# Patient Record
Sex: Female | Born: 1945 | Race: White | Hispanic: No | Marital: Married | Smoking: Never smoker
Health system: Southern US, Community
[De-identification: ages and names within clinical notes are randomized; demographics above are authoritative.]

## PROBLEM LIST (undated history)

## (undated) DIAGNOSIS — I10 Essential (primary) hypertension: Secondary | ICD-10-CM

## (undated) HISTORY — PX: BREAST SURGERY: SHX581

---

## 2001-01-13 ENCOUNTER — Ambulatory Visit: Admission: RE | Admit: 2001-01-13 | Discharge: 2001-01-13 | Payer: Self-pay | Admitting: Gynecology

## 2001-01-15 ENCOUNTER — Encounter: Admission: RE | Admit: 2001-01-15 | Discharge: 2001-04-15 | Payer: Self-pay | Admitting: *Deleted

## 2001-02-05 ENCOUNTER — Ambulatory Visit (HOSPITAL_COMMUNITY): Admission: AD | Admit: 2001-02-05 | Discharge: 2001-02-05 | Payer: Self-pay | Admitting: *Deleted

## 2001-03-01 ENCOUNTER — Encounter: Admission: RE | Admit: 2001-03-01 | Discharge: 2001-03-01 | Payer: Self-pay | Admitting: Oncology

## 2001-03-01 ENCOUNTER — Encounter (HOSPITAL_COMMUNITY): Admission: RE | Admit: 2001-03-01 | Discharge: 2001-03-31 | Payer: Self-pay | Admitting: Oncology

## 2001-03-24 ENCOUNTER — Ambulatory Visit: Admission: RE | Admit: 2001-03-24 | Discharge: 2001-03-24 | Payer: Self-pay | Admitting: Gynecology

## 2001-03-30 ENCOUNTER — Inpatient Hospital Stay (HOSPITAL_COMMUNITY): Admission: RE | Admit: 2001-03-30 | Discharge: 2001-04-02 | Payer: Self-pay | Admitting: *Deleted

## 2001-04-14 ENCOUNTER — Encounter: Admission: RE | Admit: 2001-04-14 | Discharge: 2001-04-14 | Payer: Self-pay | Admitting: Oncology

## 2001-05-17 ENCOUNTER — Encounter (HOSPITAL_COMMUNITY): Admission: RE | Admit: 2001-05-17 | Discharge: 2001-06-16 | Payer: Self-pay | Admitting: Oncology

## 2001-05-17 ENCOUNTER — Encounter: Admission: RE | Admit: 2001-05-17 | Discharge: 2001-05-17 | Payer: Self-pay | Admitting: Oncology

## 2001-07-27 ENCOUNTER — Ambulatory Visit: Admission: RE | Admit: 2001-07-27 | Discharge: 2001-07-27 | Payer: Self-pay | Admitting: Gynecology

## 2001-07-27 ENCOUNTER — Other Ambulatory Visit: Admission: RE | Admit: 2001-07-27 | Discharge: 2001-07-27 | Payer: Self-pay | Admitting: Gynecology

## 2001-07-28 ENCOUNTER — Encounter: Admission: RE | Admit: 2001-07-28 | Discharge: 2001-07-28 | Payer: Self-pay | Admitting: Oncology

## 2001-09-08 ENCOUNTER — Encounter: Admission: RE | Admit: 2001-09-08 | Discharge: 2001-09-08 | Payer: Self-pay | Admitting: Oncology

## 2001-10-19 ENCOUNTER — Encounter (HOSPITAL_COMMUNITY): Payer: Self-pay | Admitting: Oncology

## 2001-10-19 ENCOUNTER — Encounter: Admission: RE | Admit: 2001-10-19 | Discharge: 2001-10-19 | Payer: Self-pay | Admitting: Oncology

## 2001-10-19 ENCOUNTER — Encounter (HOSPITAL_COMMUNITY): Admission: RE | Admit: 2001-10-19 | Discharge: 2001-11-18 | Payer: Self-pay | Admitting: Oncology

## 2001-11-30 ENCOUNTER — Encounter (HOSPITAL_COMMUNITY): Admission: RE | Admit: 2001-11-30 | Discharge: 2001-12-30 | Payer: Self-pay | Admitting: Oncology

## 2001-11-30 ENCOUNTER — Encounter: Admission: RE | Admit: 2001-11-30 | Discharge: 2001-11-30 | Payer: Self-pay | Admitting: Oncology

## 2001-11-30 ENCOUNTER — Encounter (HOSPITAL_COMMUNITY): Payer: Self-pay | Admitting: Oncology

## 2002-02-09 ENCOUNTER — Ambulatory Visit: Admission: RE | Admit: 2002-02-09 | Discharge: 2002-02-09 | Payer: Self-pay | Admitting: Gynecologic Oncology

## 2002-02-09 ENCOUNTER — Other Ambulatory Visit: Admission: RE | Admit: 2002-02-09 | Discharge: 2002-02-09 | Payer: Self-pay | Admitting: Gynecologic Oncology

## 2002-02-24 ENCOUNTER — Encounter: Admission: RE | Admit: 2002-02-24 | Discharge: 2002-02-24 | Payer: Self-pay | Admitting: Oncology

## 2002-04-05 ENCOUNTER — Encounter: Admission: RE | Admit: 2002-04-05 | Discharge: 2002-04-05 | Payer: Self-pay | Admitting: Oncology

## 2002-05-19 ENCOUNTER — Encounter: Admission: RE | Admit: 2002-05-19 | Discharge: 2002-05-19 | Payer: Self-pay | Admitting: Oncology

## 2002-05-19 ENCOUNTER — Encounter (HOSPITAL_COMMUNITY): Admission: RE | Admit: 2002-05-19 | Discharge: 2002-06-18 | Payer: Self-pay | Admitting: Oncology

## 2002-06-29 ENCOUNTER — Encounter: Admission: RE | Admit: 2002-06-29 | Discharge: 2002-06-29 | Payer: Self-pay | Admitting: Oncology

## 2002-06-29 ENCOUNTER — Encounter (HOSPITAL_COMMUNITY): Admission: RE | Admit: 2002-06-29 | Discharge: 2002-07-29 | Payer: Self-pay | Admitting: Oncology

## 2002-08-10 ENCOUNTER — Encounter: Admission: RE | Admit: 2002-08-10 | Discharge: 2002-08-10 | Payer: Self-pay | Admitting: Oncology

## 2002-09-21 ENCOUNTER — Encounter: Admission: RE | Admit: 2002-09-21 | Discharge: 2002-09-21 | Payer: Self-pay | Admitting: Oncology

## 2002-10-26 ENCOUNTER — Other Ambulatory Visit: Admission: RE | Admit: 2002-10-26 | Discharge: 2002-10-26 | Payer: Self-pay | Admitting: Gynecology

## 2002-10-26 ENCOUNTER — Ambulatory Visit: Admission: RE | Admit: 2002-10-26 | Discharge: 2002-10-26 | Payer: Self-pay | Admitting: Gynecology

## 2002-11-02 ENCOUNTER — Encounter: Admission: RE | Admit: 2002-11-02 | Discharge: 2002-11-02 | Payer: Self-pay | Admitting: Oncology

## 2002-11-08 ENCOUNTER — Ambulatory Visit (HOSPITAL_COMMUNITY): Admission: RE | Admit: 2002-11-08 | Discharge: 2002-11-08 | Payer: Self-pay | Admitting: General Surgery

## 2003-08-30 ENCOUNTER — Encounter (HOSPITAL_COMMUNITY): Admission: RE | Admit: 2003-08-30 | Discharge: 2003-09-29 | Payer: Self-pay | Admitting: Oncology

## 2003-08-30 ENCOUNTER — Encounter: Admission: RE | Admit: 2003-08-30 | Discharge: 2003-08-30 | Payer: Self-pay | Admitting: Oncology

## 2006-04-07 ENCOUNTER — Ambulatory Visit (HOSPITAL_COMMUNITY): Admission: RE | Admit: 2006-04-07 | Discharge: 2006-04-07 | Payer: Self-pay | Admitting: Internal Medicine

## 2006-11-13 ENCOUNTER — Ambulatory Visit (HOSPITAL_COMMUNITY): Admission: RE | Admit: 2006-11-13 | Discharge: 2006-11-13 | Payer: Self-pay | Admitting: Internal Medicine

## 2010-12-15 ENCOUNTER — Encounter: Payer: Self-pay | Admitting: Internal Medicine

## 2011-01-27 ENCOUNTER — Ambulatory Visit (HOSPITAL_COMMUNITY)
Admission: RE | Admit: 2011-01-27 | Discharge: 2011-01-27 | Disposition: A | Discharge status: Home / Self Care | Payer: Self-pay | Source: Ambulatory Visit | Attending: Internal Medicine | Admitting: Internal Medicine

## 2011-01-27 ENCOUNTER — Emergency Department (HOSPITAL_COMMUNITY)
Admission: EM | Admit: 2011-01-27 | Discharge: 2011-01-27 | Disposition: A | Discharge status: Home / Self Care | Payer: Self-pay | Attending: Emergency Medicine | Admitting: Emergency Medicine

## 2011-01-27 ENCOUNTER — Other Ambulatory Visit (HOSPITAL_COMMUNITY): Payer: Self-pay | Admitting: Internal Medicine

## 2011-01-27 DIAGNOSIS — M7989 Other specified soft tissue disorders: Secondary | ICD-10-CM

## 2011-01-27 DIAGNOSIS — R52 Pain, unspecified: Secondary | ICD-10-CM

## 2011-01-27 DIAGNOSIS — I82890 Acute embolism and thrombosis of other specified veins: Secondary | ICD-10-CM | POA: Insufficient documentation

## 2011-01-27 DIAGNOSIS — I82409 Acute embolism and thrombosis of unspecified deep veins of unspecified lower extremity: Secondary | ICD-10-CM | POA: Insufficient documentation

## 2011-01-27 DIAGNOSIS — M79609 Pain in unspecified limb: Secondary | ICD-10-CM | POA: Insufficient documentation

## 2011-01-27 LAB — CBC
HCT: 43.1 % (ref 36.0–46.0)
Hemoglobin: 14.5 g/dL (ref 12.0–15.0)
MCH: 29.9 pg (ref 26.0–34.0)
MCHC: 33.6 g/dL (ref 30.0–36.0)
MCV: 88.9 fL (ref 78.0–100.0)
Platelets: 141 10*3/uL — ABNORMAL LOW (ref 150–400)
RBC: 4.85 MIL/uL (ref 3.87–5.11)
RDW: 13.8 % (ref 11.5–15.5)
WBC: 4.4 10*3/uL (ref 4.0–10.5)

## 2011-01-27 LAB — POCT I-STAT, CHEM 8
BUN: 18 mg/dL (ref 6–23)
Calcium, Ion: 1.16 mmol/L (ref 1.12–1.32)
Chloride: 102 mEq/L (ref 96–112)
Creatinine, Ser: 0.8 mg/dL (ref 0.4–1.2)
Glucose, Bld: 168 mg/dL — ABNORMAL HIGH (ref 70–99)
HCT: 43 % (ref 36.0–46.0)
Hemoglobin: 14.6 g/dL (ref 12.0–15.0)
Potassium: 3.8 mEq/L (ref 3.5–5.1)
Sodium: 138 mEq/L (ref 135–145)
TCO2: 26 mmol/L (ref 0–100)

## 2011-01-27 LAB — DIFFERENTIAL
Basophils Absolute: 0 10*3/uL (ref 0.0–0.1)
Basophils Relative: 0 % (ref 0–1)
Eosinophils Absolute: 0 10*3/uL (ref 0.0–0.7)
Eosinophils Relative: 1 % (ref 0–5)
Lymphocytes Relative: 30 % (ref 12–46)
Lymphs Abs: 1.3 10*3/uL (ref 0.7–4.0)
Monocytes Absolute: 0.4 10*3/uL (ref 0.1–1.0)
Monocytes Relative: 8 % (ref 3–12)
Neutro Abs: 2.7 10*3/uL (ref 1.7–7.7)
Neutrophils Relative %: 61 % (ref 43–77)

## 2011-01-27 LAB — APTT: aPTT: 29 seconds (ref 24–37)

## 2011-01-27 LAB — PROTIME-INR
INR: 0.93 (ref 0.00–1.49)
Prothrombin Time: 12.7 seconds (ref 11.6–15.2)

## 2011-04-25 ENCOUNTER — Other Ambulatory Visit (HOSPITAL_COMMUNITY): Payer: Self-pay | Admitting: Internal Medicine

## 2011-04-25 DIAGNOSIS — Z139 Encounter for screening, unspecified: Secondary | ICD-10-CM

## 2011-05-01 ENCOUNTER — Ambulatory Visit (HOSPITAL_COMMUNITY): Payer: Self-pay

## 2011-05-06 ENCOUNTER — Ambulatory Visit (HOSPITAL_COMMUNITY)
Admission: RE | Admit: 2011-05-06 | Discharge: 2011-05-06 | Disposition: A | Discharge status: Home / Self Care | Payer: Medicare Other | Source: Ambulatory Visit | Attending: Internal Medicine | Admitting: Internal Medicine

## 2011-05-06 DIAGNOSIS — Z1231 Encounter for screening mammogram for malignant neoplasm of breast: Secondary | ICD-10-CM | POA: Insufficient documentation

## 2011-05-06 DIAGNOSIS — Z139 Encounter for screening, unspecified: Secondary | ICD-10-CM

## 2011-07-18 ENCOUNTER — Other Ambulatory Visit (HOSPITAL_COMMUNITY): Payer: Self-pay | Admitting: Internal Medicine

## 2011-07-18 ENCOUNTER — Encounter (HOSPITAL_COMMUNITY): Payer: Self-pay

## 2011-07-18 ENCOUNTER — Ambulatory Visit (HOSPITAL_COMMUNITY)
Admission: RE | Admit: 2011-07-18 | Discharge: 2011-07-18 | Disposition: A | Discharge status: Home / Self Care | Payer: Medicare Other | Source: Ambulatory Visit | Attending: Internal Medicine | Admitting: Internal Medicine

## 2011-07-18 DIAGNOSIS — W19XXXA Unspecified fall, initial encounter: Secondary | ICD-10-CM | POA: Insufficient documentation

## 2011-07-18 DIAGNOSIS — S52539B Colles' fracture of unspecified radius, initial encounter for open fracture type I or II: Secondary | ICD-10-CM | POA: Insufficient documentation

## 2011-07-18 HISTORY — DX: Essential (primary) hypertension: I10

## 2013-05-11 ENCOUNTER — Ambulatory Visit: Payer: Medicare Other | Admitting: Cardiovascular Disease

## 2013-08-22 ENCOUNTER — Emergency Department (HOSPITAL_COMMUNITY): Payer: Medicare Other

## 2013-08-22 ENCOUNTER — Inpatient Hospital Stay (HOSPITAL_COMMUNITY)
Admission: EM | Admit: 2013-08-22 | Discharge: 2013-08-29 | DRG: 087 | Disposition: A | Discharge status: Home / Self Care | Payer: Medicare Other | Attending: Neurological Surgery | Admitting: Neurological Surgery

## 2013-08-22 ENCOUNTER — Inpatient Hospital Stay (HOSPITAL_COMMUNITY): Payer: Medicare Other

## 2013-08-22 ENCOUNTER — Encounter (HOSPITAL_COMMUNITY): Payer: Self-pay | Admitting: *Deleted

## 2013-08-22 DIAGNOSIS — S62113A Displaced fracture of triquetrum [cuneiform] bone, unspecified wrist, initial encounter for closed fracture: Secondary | ICD-10-CM | POA: Diagnosis present

## 2013-08-22 DIAGNOSIS — I10 Essential (primary) hypertension: Secondary | ICD-10-CM | POA: Diagnosis present

## 2013-08-22 DIAGNOSIS — W19XXXA Unspecified fall, initial encounter: Secondary | ICD-10-CM | POA: Diagnosis present

## 2013-08-22 DIAGNOSIS — Z79899 Other long term (current) drug therapy: Secondary | ICD-10-CM

## 2013-08-22 DIAGNOSIS — E119 Type 2 diabetes mellitus without complications: Secondary | ICD-10-CM | POA: Diagnosis present

## 2013-08-22 DIAGNOSIS — S62112A Displaced fracture of triquetrum [cuneiform] bone, left wrist, initial encounter for closed fracture: Secondary | ICD-10-CM

## 2013-08-22 DIAGNOSIS — R112 Nausea with vomiting, unspecified: Secondary | ICD-10-CM | POA: Diagnosis present

## 2013-08-22 DIAGNOSIS — S065X9A Traumatic subdural hemorrhage with loss of consciousness of unspecified duration, initial encounter: Principal | ICD-10-CM | POA: Diagnosis present

## 2013-08-22 DIAGNOSIS — S065XAA Traumatic subdural hemorrhage with loss of consciousness status unknown, initial encounter: Principal | ICD-10-CM | POA: Diagnosis present

## 2013-08-22 DIAGNOSIS — R51 Headache: Secondary | ICD-10-CM | POA: Diagnosis present

## 2013-08-22 LAB — CBC
HCT: 45.1 % (ref 36.0–46.0)
Hemoglobin: 15.4 g/dL — ABNORMAL HIGH (ref 12.0–15.0)
MCH: 30.7 pg (ref 26.0–34.0)
MCHC: 34.1 g/dL (ref 30.0–36.0)
MCV: 90 fL (ref 78.0–100.0)
Platelets: 142 10*3/uL — ABNORMAL LOW (ref 150–400)
RBC: 5.01 MIL/uL (ref 3.87–5.11)
RDW: 13.4 % (ref 11.5–15.5)
WBC: 6.1 10*3/uL (ref 4.0–10.5)

## 2013-08-22 LAB — COMPREHENSIVE METABOLIC PANEL
ALT: 14 U/L (ref 0–35)
AST: 14 U/L (ref 0–37)
Albumin: 4.2 g/dL (ref 3.5–5.2)
Alkaline Phosphatase: 92 U/L (ref 39–117)
BUN: 22 mg/dL (ref 6–23)
CO2: 29 mEq/L (ref 19–32)
Calcium: 10.4 mg/dL (ref 8.4–10.5)
Chloride: 102 mEq/L (ref 96–112)
Creatinine, Ser: 0.7 mg/dL (ref 0.50–1.10)
GFR calc Af Amer: >90 mL/min (ref >90)
GFR calc non Af Amer: 88 mL/min — ABNORMAL LOW (ref >90)
Glucose, Bld: 222 mg/dL — ABNORMAL HIGH (ref 70–99)
Potassium: 4.1 mEq/L (ref 3.5–5.1)
Sodium: 140 mEq/L (ref 135–145)
Total Bilirubin: 1 mg/dL (ref 0.3–1.2)
Total Protein: 7.5 g/dL (ref 6.0–8.3)

## 2013-08-22 LAB — GLUCOSE, CAPILLARY
Glucose-Capillary: 206 mg/dL — ABNORMAL HIGH (ref 70–99)
Glucose-Capillary: 202 mg/dL — ABNORMAL HIGH (ref 70–99)
Glucose-Capillary: 170 mg/dL — ABNORMAL HIGH (ref 70–99)

## 2013-08-22 LAB — DIFFERENTIAL
Basophils Absolute: 0 10*3/uL (ref 0.0–0.1)
Basophils Relative: 0 % (ref 0–1)
Eosinophils Absolute: 0.1 10*3/uL (ref 0.0–0.7)
Eosinophils Relative: 1 % (ref 0–5)
Lymphocytes Relative: 17 % (ref 12–46)
Lymphs Abs: 1.1 10*3/uL (ref 0.7–4.0)
Monocytes Absolute: 0.4 10*3/uL (ref 0.1–1.0)
Monocytes Relative: 6 % (ref 3–12)
Neutro Abs: 4.6 10*3/uL (ref 1.7–7.7)
Neutrophils Relative %: 76 % (ref 43–77)

## 2013-08-22 LAB — POCT I-STAT, CHEM 8
BUN: 22 mg/dL (ref 6–23)
Calcium, Ion: 1.24 mmol/L (ref 1.13–1.30)
Chloride: 103 mEq/L (ref 96–112)
Creatinine, Ser: 0.7 mg/dL (ref 0.50–1.10)
Glucose, Bld: 214 mg/dL — ABNORMAL HIGH (ref 70–99)
HCT: 46 % (ref 36.0–46.0)
Hemoglobin: 15.6 g/dL — ABNORMAL HIGH (ref 12.0–15.0)
Potassium: 4 mEq/L (ref 3.5–5.1)
Sodium: 142 mEq/L (ref 135–145)
TCO2: 27 mmol/L (ref 0–100)

## 2013-08-22 LAB — PROTIME-INR
INR: 1.02 (ref 0.00–1.49)
Prothrombin Time: 13.2 seconds (ref 11.6–15.2)

## 2013-08-22 LAB — APTT: aPTT: 30 seconds (ref 24–37)

## 2013-08-22 LAB — TROPONIN I: Troponin I: <0.3 ng/mL (ref <0.30)

## 2013-08-22 LAB — ETHANOL: Alcohol, Ethyl (B): <11 mg/dL (ref 0–11)

## 2013-08-22 LAB — MRSA PCR SCREENING: MRSA by PCR: NEGATIVE

## 2013-08-22 MED ORDER — METFORMIN HCL 500 MG PO TABS
1000.0000 mg | ORAL_TABLET | Freq: Two times a day (BID) | ORAL | Status: DC
  Administered 2013-08-23 – 2013-08-27 (×6): 1000 mg via ORAL
  Filled 2013-08-22 (×16): qty 2

## 2013-08-22 MED ORDER — MORPHINE SULFATE 4 MG/ML IJ SOLN
4.0000 mg | Freq: Once | INTRAMUSCULAR | Status: AC
  Administered 2013-08-22: 4 mg via INTRAVENOUS
  Filled 2013-08-22: qty 1

## 2013-08-22 MED ORDER — MORPHINE SULFATE 4 MG/ML IJ SOLN
4.0000 mg | Freq: Once | INTRAMUSCULAR | Status: AC
  Administered 2013-08-22: 4 mg via INTRAMUSCULAR
  Filled 2013-08-22: qty 1

## 2013-08-22 MED ORDER — LOSARTAN POTASSIUM 50 MG PO TABS
100.0000 mg | ORAL_TABLET | Freq: Every day | ORAL | Status: DC
Start: 2013-08-22 — End: 2013-08-29
  Administered 2013-08-22 – 2013-08-29 (×8): 100 mg via ORAL
  Filled 2013-08-22 (×9): qty 2

## 2013-08-22 MED ORDER — HYDROCODONE-ACETAMINOPHEN 5-325 MG PO TABS
1.0000 | ORAL_TABLET | ORAL | Status: DC | PRN
  Administered 2013-08-22: 1 via ORAL
  Administered 2013-08-23: 2 via ORAL
  Administered 2013-08-23 (×2): 1 via ORAL
  Administered 2013-08-23 – 2013-08-24 (×3): 2 via ORAL
  Administered 2013-08-24: 1 via ORAL
  Administered 2013-08-25: 2 via ORAL
  Administered 2013-08-25: 1 via ORAL
  Administered 2013-08-25 – 2013-08-26 (×2): 2 via ORAL
  Administered 2013-08-26: 1 via ORAL
  Administered 2013-08-27 (×2): 2 via ORAL
  Administered 2013-08-27: 1 via ORAL
  Administered 2013-08-28 – 2013-08-29 (×5): 2 via ORAL
  Filled 2013-08-22 (×3): qty 2
  Filled 2013-08-22: qty 1
  Filled 2013-08-22 (×4): qty 2
  Filled 2013-08-22: qty 1
  Filled 2013-08-22: qty 2
  Filled 2013-08-22 (×2): qty 1
  Filled 2013-08-22 (×8): qty 2
  Filled 2013-08-22 (×2): qty 1

## 2013-08-22 MED ORDER — ONDANSETRON 8 MG PO TBDP
8.0000 mg | ORAL_TABLET | Freq: Once | ORAL | Status: AC
  Administered 2013-08-22: 8 mg via ORAL
  Filled 2013-08-22: qty 1

## 2013-08-22 MED ORDER — LOSARTAN POTASSIUM 50 MG PO TABS: 100.0000 mg | ORAL_TABLET | Freq: Every day | ORAL | Status: DC

## 2013-08-22 NOTE — ED Notes (Signed)
MD at bedside. 

## 2013-08-22 NOTE — H&P (Signed)
Susan Odom is an 67 y.o. female.   Chief Complaint: Headaches for past week HPI: Patient is a 67 year old right handed female who has had a fall a week ago Tuesday that is 6 days ago. She notes that she had significant headaches and had pain she has been persisting with these and has been nauseated for the past several days. She's had some episodes of vomiting. She was brought to the [facility] emergency room where was noted that she had a subdural hematoma on the right side measuring about 6 or 7 mm in thickness is also noted be 6-7 mm of midline shift right to left. The subdural hematoma covers a large expansile to the surface of her skull.  The patient denies that she's had any weakness dizziness she has been nauseated along with the headaches she has noted some very minimal neck stiffness she has some pain in her left upper extremity which was injured in the fall that she did sustain. She is wearing a splint on his left upper extremity.  Past Medical History  Diagnosis Date  . Hypertension   . Diabetes mellitus     History reviewed. No pertinent past surgical history.  History reviewed. No pertinent family history. Social History:  reports that she has never smoked. She does not have any smokeless tobacco history on file. She reports that she does not drink alcohol or use illicit drugs.  Allergies: No Known Allergies  Medications Prior to Admission  Medication Sig Dispense Refill  . acetaminophen (TYLENOL) 500 MG tablet Take 1,000 mg by mouth every 6 (six) hours as needed for pain.      Marland Kitchen ALPRAZolam (XANAX) 1 MG tablet Take 1 mg by mouth 4 (four) times daily.      Marland Kitchen losartan (COZAAR) 100 MG tablet Take 100 mg by mouth daily.      . metFORMIN (GLUCOPHAGE) 500 MG tablet Take 1,000 mg by mouth 2 (two) times daily.      Marland Kitchen oxyCODONE-acetaminophen (PERCOCET) 5-325 MG per tablet Take 1 tablet by mouth every 4 (four) hours as needed for pain.        Results for orders placed during the  hospital encounter of 08/22/13 (from the past 48 hour(s))  GLUCOSE, CAPILLARY     Status: Abnormal   Collection Time    08/22/13 12:32 PM      Result Value Range   Glucose-Capillary 206 (*) 70 - 99 mg/dL   Comment 1 Documented in Chart     Comment 2 Notify RN    GLUCOSE, CAPILLARY     Status: Abnormal   Collection Time    08/22/13  4:17 PM      Result Value Range   Glucose-Capillary 202 (*) 70 - 99 mg/dL  ETHANOL     Status: None   Collection Time    08/22/13  4:23 PM      Result Value Range   Alcohol, Ethyl (B) <11  0 - 11 mg/dL   Comment:            LOWEST DETECTABLE LIMIT FOR     SERUM ALCOHOL IS 11 mg/dL     FOR MEDICAL PURPOSES ONLY  PROTIME-INR     Status: None   Collection Time    08/22/13  4:23 PM      Result Value Range   Prothrombin Time 13.2  11.6 - 15.2 seconds   INR 1.02  0.00 - 1.49  APTT     Status: None  Collection Time    08/22/13  4:23 PM      Result Value Range   aPTT 30  24 - 37 seconds  CBC     Status: Abnormal   Collection Time    08/22/13  4:23 PM      Result Value Range   WBC 6.1  4.0 - 10.5 K/uL   RBC 5.01  3.87 - 5.11 MIL/uL   Hemoglobin 15.4 (*) 12.0 - 15.0 g/dL   HCT 40.9  81.1 - 91.4 %   MCV 90.0  78.0 - 100.0 fL   MCH 30.7  26.0 - 34.0 pg   MCHC 34.1  30.0 - 36.0 g/dL   RDW 78.2  95.6 - 21.3 %   Platelets 142 (*) 150 - 400 K/uL  DIFFERENTIAL     Status: None   Collection Time    08/22/13  4:23 PM      Result Value Range   Neutrophils Relative % 76  43 - 77 %   Neutro Abs 4.6  1.7 - 7.7 K/uL   Lymphocytes Relative 17  12 - 46 %   Lymphs Abs 1.1  0.7 - 4.0 K/uL   Monocytes Relative 6  3 - 12 %   Monocytes Absolute 0.4  0.1 - 1.0 K/uL   Eosinophils Relative 1  0 - 5 %   Eosinophils Absolute 0.1  0.0 - 0.7 K/uL   Basophils Relative 0  0 - 1 %   Basophils Absolute 0.0  0.0 - 0.1 K/uL  COMPREHENSIVE METABOLIC PANEL     Status: Abnormal   Collection Time    08/22/13  4:23 PM      Result Value Range   Sodium 140  135 - 145 mEq/L    Potassium 4.1  3.5 - 5.1 mEq/L   Chloride 102  96 - 112 mEq/L   CO2 29  19 - 32 mEq/L   Glucose, Bld 222 (*) 70 - 99 mg/dL   BUN 22  6 - 23 mg/dL   Creatinine, Ser 0.86  0.50 - 1.10 mg/dL   Calcium 57.8  8.4 - 46.9 mg/dL   Total Protein 7.5  6.0 - 8.3 g/dL   Albumin 4.2  3.5 - 5.2 g/dL   AST 14  0 - 37 U/L   ALT 14  0 - 35 U/L   Alkaline Phosphatase 92  39 - 117 U/L   Total Bilirubin 1.0  0.3 - 1.2 mg/dL   GFR calc non Af Amer 88 (*) >90 mL/min   GFR calc Af Amer >90  >90 mL/min   Comment: (NOTE)     The eGFR has been calculated using the CKD EPI equation.     This calculation has not been validated in all clinical situations.     eGFR's persistently <90 mL/min signify possible Chronic Kidney     Disease.  TROPONIN I     Status: None   Collection Time    08/22/13  4:23 PM      Result Value Range   Troponin I <0.30  <0.30 ng/mL   Comment:            Due to the release kinetics of cTnI,     a negative result within the first hours     of the onset of symptoms does not rule out     myocardial infarction with certainty.     If myocardial infarction is still suspected,     repeat the test at  appropriate intervals.  POCT I-STAT, CHEM 8     Status: Abnormal   Collection Time    08/22/13  4:39 PM      Result Value Range   Sodium 142  135 - 145 mEq/L   Potassium 4.0  3.5 - 5.1 mEq/L   Chloride 103  96 - 112 mEq/L   BUN 22  6 - 23 mg/dL   Creatinine, Ser 1.30  0.50 - 1.10 mg/dL   Glucose, Bld 865 (*) 70 - 99 mg/dL   Calcium, Ion 7.84  6.96 - 1.30 mmol/L   TCO2 27  0 - 100 mmol/L   Hemoglobin 15.6 (*) 12.0 - 15.0 g/dL   HCT 29.5  28.4 - 13.2 %   Ct Head Wo Contrast  08/22/2013   CLINICAL DATA:  Tripped and fell 6 days ago striking front of head, no loss of consciousness, headache since, history hypertension, diabetes, initial encounter  EXAM: CT HEAD WITHOUT CONTRAST  TECHNIQUE: Contiguous axial images were obtained from the base of the skull through the vertex without  intravenous contrast.  COMPARISON:  None  FINDINGS: Normal ventricular morphology.  11 mm maximum thickness right parietal subdural hematoma extending into the lateral right frontal and right temporal regions.  Hematoma 8 exerts mass effect upon right hemisphere with 5 mm of right to left midline shift.  Posterior fossa unremarkable.  No intraparenchymal hemorrhage, mass or evidence of acute infarction.  Bones appear demineralized.  No calvarial fracture or sinus opacification.  IMPRESSION: Right subdural hematoma in the right parietal region extending into the right frontal and temporal regions, 11 mm in greatest thickness, with mass effect upon the right hemisphere and 5 mm of right to left midline shift.  CriticalValue/emergent results were called by telephone at the time of interpretation on 08/22/2013 at 3:54 PMto Zadie Rhine , who verbally acknowledged these results.   Electronically Signed   By: Ulyses Southward M.D.   On: 08/22/2013 15:56   Dg Hand Complete Left  08/22/2013   CLINICAL DATA:  Fall 6 days ago. Swelling at the MCP joints and digits.  EXAM: LEFT HAND - COMPLETE 3+ VIEW  COMPARISON:  Left wrist 07/18/2011  FINDINGS: Joint space narrowing in the PIP and DIP joints of the digits. There is cortical thickening involving the distal radius consistent with an old fracture. There is also an ossification along the tip of the ulna which is likely chronic. Cannot exclude soft tissue swelling along the dorsal aspect of the hand and wrist. There appears to be lucency and some irregularity involving the triquetrum bone. Triquetrum may be slightly fragmented on the oblique views.  IMPRESSION: Irregularity of the triquetrum and cannot exclude a fracture.  Old fracture of the distal radius.  Degenerative changes in the digits.   Electronically Signed   By: Richarda Overlie M.D.   On: 08/22/2013 15:48    Review of Systems  Constitutional: Positive for malaise/fatigue.  HENT: Positive for neck pain.   Eyes:  Negative.   Respiratory: Negative.   Cardiovascular: Negative.   Gastrointestinal: Negative.   Genitourinary: Negative.   Skin: Negative.   Neurological: Positive for headaches.  Endo/Heme/Allergies: Negative.   Psychiatric/Behavioral: Negative.     Blood pressure 155/91, pulse 76, temperature 97.9 F (36.6 C), temperature source Oral, resp. rate 18, height 5\' 10"  (1.778 m), weight 117.935 kg (260 lb), SpO2 95.00%. Physical Exam  Constitutional: She is oriented to person, place, and time. She appears well-developed and well-nourished.  HENT:  Head: Normocephalic  and atraumatic.  Eyes: Conjunctivae and EOM are normal. Pupils are equal, round, and reactive to light.  Neck: Normal range of motion. Neck supple.  Cardiovascular: Normal rate and regular rhythm.   Respiratory: Effort normal and breath sounds normal.  GI: Soft.  Musculoskeletal: Normal range of motion.  Neurological: She is alert and oriented to person, place, and time. She has normal reflexes.  Skin: Skin is dry.  Psychiatric: She has a normal mood and affect. Her behavior is normal. Judgment and thought content normal.     Assessment/Plan Acute, subacute subdural hematoma on the right side with right to left shift. The patient is admitted now for consideration of surgical intervention. Given the fact that she has been nauseated and headaches are rather untenable I have advised that she may be a good candidate for surgery. No family is present at the current time, certainly there is no emergency regarding her surgery. I'll discuss the situation with the family and the patient in the morning we'll make further decision to proceed or not to proceed with surgical intervention however I believe that the patient's condition certainly warrants surgical evacuation of this sizable subdural hematoma.  Sheryl Saintil J 08/22/2013, 7:56 PM

## 2013-08-22 NOTE — ED Notes (Addendum)
Fell 6 days ago, tripped over a strap and fell onto gravel. Headache since then. No LOC.  Pt says she has not been taking metformin.

## 2013-08-22 NOTE — ED Provider Notes (Signed)
CSN: [id number]     Arrival date & time 08/22/13  1205 History   This chart was scribed for Joya Gaskins, MD by Bennett Scrape, ED Scribe. This patient was seen in room APA09/APA09 and the patient's care was started at 2:31 PM.   Chief Complaint  Patient presents with  . Headache    Patient is a 67 y.o. female presenting with headaches. The history is provided by the patient. No language interpreter was used.  Headache Pain location:  Frontal Quality:  Unable to specify Radiates to:  Does not radiate Onset quality:  Gradual Duration:  6 days Timing:  Constant Progression:  Unchanged Chronicity:  New Context comment:  Fall  Relieved by:  Nothing Worsened by:  Nothing tried Ineffective treatments:  None tried   HPI Comments: Susan Odom is a 67 y.o. female who presents to the Emergency Department complaining of 6 days of a persistent HA that occurred after a trip and fall. Pt states that she tripped over a tote bag and she went face first into her gravel driveway. She denies LOC. She also reports associated emesis and left hand pain. She states that she has tried Tylenol with no improvement, last dose was 4 hours ago. She denies neck pain, back pain or other injuries. She states that she has been ambulatory since the fall. She denies being on anticoagulants.  Past Medical History  Diagnosis Date  . Hypertension   . Diabetes mellitus    History reviewed. No pertinent past surgical history. History reviewed. No pertinent family history. History  Substance Use Topics  . Smoking status: Never Smoker   . Smokeless tobacco: Not on file  . Alcohol Use: No   No OB history provided.  Review of Systems A complete 10 system review of systems was obtained and all systems are negative except as noted in the HPI and PMH.   Allergies  Review of patient's allergies indicates no known allergies.  Home Medications  No current outpatient prescriptions on file.  Triage Vitals:  BP 162/92  Pulse 69  Temp(Src) 97.8 F (36.6 C) (Oral)  Resp 18  Ht 5\' 10"  (1.778 m)  Wt 260 lb (117.935 kg)  BMI 37.31 kg/m2  SpO2 97%  Physical Exam  Nursing note and vitals reviewed.  CONSTITUTIONAL: Well developed/well nourished HEAD: Normocephalic/atraumatic EYES: EOMI/PERRL, no nystagmus ENMT: Mucous membranes moist, No evidence of facial/nasal trauma NECK: supple no meningeal signs, no bruits SPINE:entire spine nontender,No bruising/crepitance/stepoffs noted to spine NEXUS criteria met CV: S1/S2 noted, no murmurs/rubs/gallops noted LUNGS: Lungs are clear to auscultation bilaterally, no apparent distress Chest -nontender to palpation ABDOMEN: soft, nontender, no rebound or guarding GU:no cva tenderness NEURO:Awake/alert, facies symmetric, no arm or leg drift is noted, GCS 15 EXTREMITIES: pulses normal, full ROM, tenderness to left hand, no deformity, All other extremities/joints palpated/ranged and nontender SKIN: warm, color normal PSYCH: no abnormalities of mood noted   ED Course  Procedures   CRITICAL CARE Performed by: Joya Gaskins Total critical care time: 33 Critical care time was exclusive of separately billable procedures and treating other patients. Critical care was necessary to treat or prevent imminent or life-threatening deterioration. Critical care was time spent personally by me on the following activities: development of treatment plan with patient and/or surrogate as well as nursing, discussions with consultants, evaluation of patient's response to treatment, examination of patient, obtaining history from patient or surrogate, ordering and performing treatments and interventions, ordering and review of laboratory studies, ordering and review  of radiographic studies, pulse oximetry and re-evaluation of patient's condition.   Medications  morphine 4 MG/ML injection 4 mg (4 mg Intramuscular Given 08/22/13 1500)  ondansetron (ZOFRAN-ODT)  disintegrating tablet 8 mg (8 mg Oral Given 08/22/13 1459)    DIAGNOSTIC STUDIES: Oxygen Saturation is 97% on room air, normal by my interpretation.    COORDINATION OF CARE: 2:35 PM-Discussed treatment plan which includes medications, CT of head and x-ray of hand with pt at bedside and pt agreed to plan.    4:40 PM Pt found to have subdural hematoma She is neuro intact, GCS 15 I discussed with her need for transfer and admission D/w dr Danielle Dess, will admit to neuro ICU at [facility] Also may have occult fx to left hand, splint applied by tech  Labs Review Labs Reviewed  GLUCOSE, CAPILLARY - Abnormal; Notable for the following:    Glucose-Capillary 206 (*)    All other components within normal limits   MDM  No diagnosis found. Nursing notes including past medical history and social history reviewed and considered in documentation xrays reviewed and considered Labs/vital reviewed and considered   I personally performed the services described in this documentation, which was scribed in my presence. The recorded information has been reviewed and is accurate.       Joya Gaskins, MD 08/22/13 [phone]

## 2013-08-22 NOTE — ED Notes (Signed)
Report given to [facility]  

## 2013-08-23 LAB — RAPID URINE DRUG SCREEN, HOSP PERFORMED
Amphetamines: NOT DETECTED
Barbiturates: NOT DETECTED
Benzodiazepines: NOT DETECTED
Cocaine: NOT DETECTED
Opiates: POSITIVE — AB
Tetrahydrocannabinol: NOT DETECTED

## 2013-08-23 LAB — GLUCOSE, CAPILLARY
Glucose-Capillary: 184 mg/dL — ABNORMAL HIGH (ref 70–99)
Glucose-Capillary: 178 mg/dL — ABNORMAL HIGH (ref 70–99)
Glucose-Capillary: 163 mg/dL — ABNORMAL HIGH (ref 70–99)
Glucose-Capillary: 156 mg/dL — ABNORMAL HIGH (ref 70–99)

## 2013-08-23 LAB — URINALYSIS, ROUTINE W REFLEX MICROSCOPIC
Bilirubin Urine: NEGATIVE
Glucose, UA: NEGATIVE mg/dL
Hgb urine dipstick: NEGATIVE
Ketones, ur: NEGATIVE mg/dL
Nitrite: NEGATIVE
Protein, ur: 30 mg/dL — AB
Specific Gravity, Urine: 1.013 (ref 1.005–1.030)
Urobilinogen, UA: 0.2 mg/dL (ref 0.0–1.0)
pH: 5 (ref 5.0–8.0)

## 2013-08-23 LAB — URINE MICROSCOPIC-ADD ON

## 2013-08-23 MED ORDER — INSULIN ASPART 100 UNIT/ML ~~LOC~~ SOLN
0.0000 [IU] | Freq: Three times a day (TID) | SUBCUTANEOUS | Status: DC
  Administered 2013-08-23 – 2013-08-24 (×4): 4 [IU] via SUBCUTANEOUS
  Administered 2013-08-25: 7 [IU] via SUBCUTANEOUS
  Administered 2013-08-25: 4 [IU] via SUBCUTANEOUS
  Administered 2013-08-25: 3 [IU] via SUBCUTANEOUS
  Administered 2013-08-26 – 2013-08-27 (×5): 7 [IU] via SUBCUTANEOUS
  Administered 2013-08-27 – 2013-08-28 (×2): 4 [IU] via SUBCUTANEOUS
  Administered 2013-08-28: 7 [IU] via SUBCUTANEOUS
  Administered 2013-08-28 – 2013-08-29 (×4): 4 [IU] via SUBCUTANEOUS

## 2013-08-23 NOTE — ED Notes (Signed)
During transporting patient to stretcher patient reported that her headache had returned and was worse. Patient rates pain at a 9/10. Dr Bebe Shaggy made aware, order given.

## 2013-08-23 NOTE — Progress Notes (Signed)
Inpatient Diabetes Program Recommendations  AACE/ADA: New Consensus Statement on Inpatient Glycemic Control (2013)  Target Ranges:  Prepandial:   less than 140 mg/dL      Peak postprandial:   less than 180 mg/dL (1-2 hours)      Critically ill patients:  140 - 180 mg/dL   Please order CBGs during hospitalization.  Recommend Q4 during NPO status. Thank you  Piedad Climes BSN, RN,CDE Inpatient Diabetes Coordinator [phone] (team pager)

## 2013-08-23 NOTE — Progress Notes (Signed)
Subjective: Patient reports Asian is been awake and alert all day still has headache minimal nausea and feels hungry.  Objective: Vital signs in last 24 hours: Temp:  [97.5 F (36.4 C)-98.2 F (36.8 C)] 97.5 F (36.4 C) (09/30 1600) Pulse Rate:  [61-75] 73 (09/30 1700) Resp:  [11-23] 19 (09/30 1700) BP: (120-182)/(56-88) 154/81 mmHg (09/30 1700) SpO2:  [91 %-98 %] 94 % (09/30 1700)  Intake/Output from previous day: 09/29 0701 - 09/30 0700 In: -  Out: 350 [Urine:350] Intake/Output this shift:    No evidence of a drift motor function is intact speech and thought processes are all normal.  Lab Results:  Recent Labs  08/22/13 1623 08/22/13 1639  WBC 6.1  --   HGB 15.4* 15.6*  HCT 45.1 46.0  PLT 142*  --    BMET  Recent Labs  08/22/13 1623 08/22/13 1639  NA 140 142  K 4.1 4.0  CL 102 103  CO2 29  --   GLUCOSE 222* 214*  BUN 22 22  CREATININE 0.70 0.70  CALCIUM 10.4  --     Studies/Results: X-ray Chest Pa And Lateral   08/22/2013   CLINICAL DATA:  Preop.  EXAM: CHEST  2 VIEW  COMPARISON:  None.  FINDINGS: Cardiomegaly. Mild hyperinflation of the lungs. Left basilar atelectasis, scarring or infiltrate. Right lung is clear. No effusions. No acute bony abnormality.  IMPRESSION: Left basilar opacity which could represent atelectasis, scarring or pneumonia.  Cardiomegaly, hyperinflation.   Electronically Signed   By: Charlett Nose M.D.   On: 08/22/2013 23:18   Ct Head Wo Contrast  08/22/2013   CLINICAL DATA:  Tripped and fell 6 days ago striking front of head, no loss of consciousness, headache since, history hypertension, diabetes, initial encounter  EXAM: CT HEAD WITHOUT CONTRAST  TECHNIQUE: Contiguous axial images were obtained from the base of the skull through the vertex without intravenous contrast.  COMPARISON:  None  FINDINGS: Normal ventricular morphology.  11 mm maximum thickness right parietal subdural hematoma extending into the lateral right frontal and right  temporal regions.  Hematoma 8 exerts mass effect upon right hemisphere with 5 mm of right to left midline shift.  Posterior fossa unremarkable.  No intraparenchymal hemorrhage, mass or evidence of acute infarction.  Bones appear demineralized.  No calvarial fracture or sinus opacification.  IMPRESSION: Right subdural hematoma in the right parietal region extending into the right frontal and temporal regions, 11 mm in greatest thickness, with mass effect upon the right hemisphere and 5 mm of right to left midline shift.  CriticalValue/emergent results were called by telephone at the time of interpretation on 08/22/2013 at 3:54 PMto Zadie Rhine , who verbally acknowledged these results.   Electronically Signed   By: Ulyses Southward M.D.   On: 08/22/2013 15:56   Dg Hand Complete Left  08/22/2013   CLINICAL DATA:  Fall 6 days ago. Swelling at the MCP joints and digits.  EXAM: LEFT HAND - COMPLETE 3+ VIEW  COMPARISON:  Left wrist 07/18/2011  FINDINGS: Joint space narrowing in the PIP and DIP joints of the digits. There is cortical thickening involving the distal radius consistent with an old fracture. There is also an ossification along the tip of the ulna which is likely chronic. Cannot exclude soft tissue swelling along the dorsal aspect of the hand and wrist. There appears to be lucency and some irregularity involving the triquetrum bone. Triquetrum may be slightly fragmented on the oblique views.  IMPRESSION: Irregularity of the  triquetrum and cannot exclude a fracture.  Old fracture of the distal radius.  Degenerative changes in the digits.   Electronically Signed   By: Richarda Overlie M.D.   On: 08/22/2013 15:48    Assessment/Plan: Hold off on surgical intervention for the time being below patient to eat. Physical therapy and occupational therapy to do home health assessments. May defer surgery list patient's symptoms worsened  LOS: 1 day  Observe in hospital with PT OT assistance for home  safety   Susan Odom 08/23/2013, 6:37 PM

## 2013-08-24 ENCOUNTER — Inpatient Hospital Stay (HOSPITAL_COMMUNITY): Payer: Medicare Other

## 2013-08-24 LAB — GLUCOSE, CAPILLARY
Glucose-Capillary: 180 mg/dL — ABNORMAL HIGH (ref 70–99)
Glucose-Capillary: 170 mg/dL — ABNORMAL HIGH (ref 70–99)
Glucose-Capillary: 154 mg/dL — ABNORMAL HIGH (ref 70–99)
Glucose-Capillary: 208 mg/dL — ABNORMAL HIGH (ref 70–99)

## 2013-08-24 MED ORDER — DEXAMETHASONE SODIUM PHOSPHATE 10 MG/ML IJ SOLN
10.0000 mg | Freq: Once | INTRAMUSCULAR | Status: AC
  Administered 2013-08-24: 10 mg via INTRAVENOUS
  Filled 2013-08-24: qty 1

## 2013-08-24 MED ORDER — HYDRALAZINE HCL 20 MG/ML IJ SOLN
5.0000 mg | INTRAMUSCULAR | Status: DC | PRN
  Administered 2013-08-24: 10 mg via INTRAVENOUS
  Filled 2013-08-24: qty 1

## 2013-08-24 MED ORDER — DEXAMETHASONE SODIUM PHOSPHATE 4 MG/ML IJ SOLN
4.0000 mg | Freq: Two times a day (BID) | INTRAMUSCULAR | Status: DC
  Administered 2013-08-24 – 2013-08-26 (×4): 4 mg via INTRAVENOUS
  Filled 2013-08-24 (×6): qty 1

## 2013-08-24 MED ORDER — HYDRALAZINE HCL 20 MG/ML IJ SOLN
INTRAMUSCULAR | Status: AC
  Filled 2013-08-24: qty 1

## 2013-08-24 NOTE — Progress Notes (Signed)
Physical Therapy Evaluation Patient Details Name: Susan Odom MRN: [id number] DOB: 1946/09/18 Today's Date: 08/24/2013 Time: 4098-1191 PT Time Calculation (min): 48 min  PT Assessment / Plan / Recommendation History of Present Illness  Pt admit after fall with SDH and triquetral fracture of left wrist.    Clinical Impression  Pt admitted with SDH. Pt currently with functional limitations due to the deficits listed below (see PT Problem List). Pt with poor mobility today secondary to lethargy, poor mobility, and poor balance.  May need NHP.   Pt will benefit from skilled PT to increase their independence and safety with mobility to allow discharge to the venue listed below.     PT Assessment  Patient needs continued PT services    Follow Up Recommendations  SNF;Supervision/Assistance - 24 hour        Barriers to Discharge Decreased caregiver support Daughter cares for pt, pt husband and children    Equipment Recommendations  Other (comment) (TBA - may need left platform for walker)    Recommendations for Other Services     Frequency Min 3X/week    Precautions / Restrictions Precautions Precautions: Fall Restrictions Weight Bearing Restrictions: No   Pertinent Vitals/Pain HA 10/10 per pt, BP somewhat elevated and nursing aware.       Mobility  Bed Mobility Bed Mobility: Rolling Right;Right Sidelying to Sit;Sitting - Scoot to Edge of Bed Rolling Right: 3: Mod assist;With rail Right Sidelying to Sit: 1: +2 Total assist;HOB elevated;With rails Right Sidelying to Sit: Patient Percentage: 60% Sitting - Scoot to Edge of Bed: 2: Max assist Details for Bed Mobility Assistance: Pt needed incr assist for bed mobility.  Questionable at processing of information versus just poor endurance for activity.  Pt needed assist to move LEs as well as elevate trunk.   Transfers Transfers: Stand to Sit;Sit to Stand;Stand Pivot Transfers Sit to Stand: 1: +2 Total assist;With upper extremity  assist;From bed Sit to Stand: Patient Percentage: 50% Stand to Sit: 1: +2 Total assist;With upper extremity assist;To chair/3-in-1;With armrests Stand to Sit: Patient Percentage: 50% Stand Pivot Transfers: 1: +2 Total assist Stand Pivot Transfers: Patient Percentage: 60% Details for Transfer Assistance: Pt needed cues and assist for hand placement.  Pt with difficulty initiating standing.  Once up, very poor balance reactions with pt leaning posteriorly much o f the time.  Pt needed UE support to stand and pivot to 3N1 with pt urinating while pivoting.  Once pt finished on 3N1, cleaned pt and changed gown and socks.  Pt stood again and was cleaned of urine and placed cream on pts bottom.  Pt then pivoted to recliner again needing UE suppport.  Pt needed cues to not use left UE as pt not remembering to not use it.  Pt needed assist to control descent into chair as well.  Once in chair, pt vomited and nursing notified.  Overall pt not following commands consistently and demonstrated poor motor control.   Ambulation/Gait Ambulation/Gait Assistance: Not tested (comment) Stairs: No Wheelchair Mobility Wheelchair Mobility: No Modified Rankin (Stroke Patients Only) Pre-Morbid Rankin Score: Moderate disability Modified Rankin: Moderately severe disability         PT Diagnosis: Generalized weakness  PT Problem List: Decreased activity tolerance;Decreased balance;Decreased mobility;Decreased knowledge of use of DME;Decreased safety awareness;Decreased knowledge of precautions;Decreased cognition;Pain PT Treatment Interventions: DME instruction;Gait training;Functional mobility training;Therapeutic activities;Therapeutic exercise;Balance training;Cognitive remediation;Stair training;Patient/family education     PT Goals(Current goals can be found in the care plan section) Acute Rehab PT Goals  Patient Stated Goal: to go home PT Goal Formulation: With patient Time For Goal Achievement:  09/07/13 Potential to Achieve Goals: Good  Visit Information  Last PT Received On: 08/24/13 Assistance Needed: +2 PT/OT Co-Evaluation/Treatment: Yes History of Present Illness: Pt admit after fall with SDH and triquetral fracture of left wrist.         Prior Functioning  Home Living Family/patient expects to be discharged to:: Private residence Living Arrangements: Children;Other relatives Available Help at Discharge: Family;Available 24 hours/day Type of Home: House Home Access: Stairs to enter [employer] of Steps: 1 Entrance Stairs-Rails: None Home Layout: One level Home Equipment: Walker - 2 wheels;Bedside commode;Shower seat;Tub bench;Wheelchair - manual Prior Function Level of Independence: Independent with assistive device(s) Communication Communication: No difficulties Dominant Hand: Right    Cognition  Cognition Arousal/Alertness: Lethargic Behavior During Therapy: Flat affect Overall Cognitive Status: Impaired/Different from baseline Area of Impairment: Attention;Following commands;Safety/judgement;Awareness;Problem solving Current Attention Level: Focused Following Commands: Follows one step commands inconsistently;Follows one step commands with increased time Safety/Judgement: Decreased awareness of safety;Decreased awareness of deficits Problem Solving: Slow processing;Decreased initiation;Requires tactile cues;Requires verbal cues;Difficulty sequencing    Extremity/Trunk Assessment Upper Extremity Assessment Upper Extremity Assessment: Defer to OT evaluation Lower Extremity Assessment Lower Extremity Assessment: Generalized weakness   Balance Balance Balance Assessed: Yes Static Sitting Balance Static Sitting - Balance Support: Bilateral upper extremity supported;Feet supported Static Sitting - Level of Assistance: 5: Stand by assistance Static Sitting - Comment/# of Minutes: 2 Static Standing Balance Static Standing - Balance Support:  Bilateral upper extremity supported;During functional activity Static Standing - Level of Assistance: 3: Mod assist Static Standing - Comment/# of Minutes: Stood 2 minutes to be cleaned needing mod assist for stability secondary to poor balance reactions.    End of Session PT - End of Session Equipment Utilized During Treatment: Gait belt Activity Tolerance: Patient limited by fatigue;Patient limited by lethargy;Patient limited by pain Patient left: in chair;with call bell/phone within reach Nurse Communication: Mobility status       INGOLD,Cristina Mattern 08/24/2013, 11:19 AM [facility] [phone] (pager)

## 2013-08-24 NOTE — Progress Notes (Signed)
Patient experiencing increased systolic blood pressure. 0100 blood pressure of 183/79. MD notified. New orders given. See MAR. Will continue to monitor patient closely. [place], [place] M

## 2013-08-24 NOTE — Progress Notes (Signed)
UR completed.  Luanna Weesner, RN BSN M[facility] CCM Trauma/Neuro ICU Case Manager [phone]  

## 2013-08-24 NOTE — Progress Notes (Signed)
Dr Danielle Dess notified of pt vomiting and unsteady gait with complaint of leaning backward. Pt assisted back to bed via 2 max assist and transported to CT for follow up scan and the images were available for him to view. He was also made aware that pt is more lethargic and harder to arouse compared to yesterdays assessments but once awake pt able to communicate needs and no change in neuro assessment. Orders given to make pt NPO. Will continue to monitor.

## 2013-08-24 NOTE — Evaluation (Signed)
Occupational Therapy Evaluation Patient Details Name: Susan Odom MRN: [id number] DOB: 03-29-1946 Today's Date: 08/24/2013 Time: 6295-2841 OT Time Calculation (min): 25 min  OT Assessment / Plan / Recommendation History of present illness Pt admit after fall with SDH and triquetral fracture of left wrist.     Clinical Impression   Pt with poor tolerance for activity with severe HA and n/v.  Presents with lethargy, impaired cognition, generalized weakness, fx in L wrist with splint in place, and decreased balance.  Pt is dependent in all ADL.  Recommending SNF for rehab prior to return home.  Will follow acutely.   OT Assessment  Patient needs continued OT Services    Follow Up Recommendations  SNF    Barriers to Discharge      Equipment Recommendations  None recommended by OT    Recommendations for Other Services    Frequency  Min 2X/week    Precautions / Restrictions Precautions Precautions: Fall Restrictions Weight Bearing Restrictions: No   Pertinent Vitals/Pain 10/10 HA, RN notified, VSS monitored throughout    ADL  Eating/Feeding: Moderate assistance (can use a cup, assist to feed with utensil) Where Assessed - Eating/Feeding: Bed level Grooming: Wash/dry face;Minimal assistance Where Assessed - Grooming: Supported sitting Upper Body Bathing: Maximal assistance Where Assessed - Upper Body Bathing: Unsupported sitting Lower Body Bathing: +1 Total assistance Where Assessed - Lower Body Bathing: Supported sit to stand Upper Body Dressing: Moderate assistance Where Assessed - Upper Body Dressing: Unsupported sitting Lower Body Dressing: +1 Total assistance Where Assessed - Lower Body Dressing: Unsupported sitting Toilet Transfer: +2 Total assistance Toilet Transfer: Patient Percentage: 50% Toilet Transfer Method: Sit to stand Toilet Transfer Equipment: Bedside commode Toileting - Clothing Manipulation and Hygiene: +1 Total assistance Where Assessed - Agricultural consultant Manipulation and Hygiene: Standing Equipment Used: Gait belt Transfers/Ambulation Related to ADLs: +2 total assist, pt 50 % bed to 3 in 1 to chair ADL Comments: Pt with lethargy, 10/10 HA and vomiting after activity.    OT Diagnosis: Generalized weakness;Acute pain;Cognitive deficits  OT Problem List: Decreased strength;Decreased activity tolerance;Impaired balance (sitting and/or standing);Decreased coordination;Decreased cognition;Decreased knowledge of use of DME or AE;Pain;Impaired UE functional use;Obesity OT Treatment Interventions: Self-care/ADL training;Therapeutic exercise;DME and/or AE instruction;Therapeutic activities;Cognitive remediation/compensation;Patient/family education   OT Goals(Current goals can be found in the care plan section) Acute Rehab OT Goals Patient Stated Goal: to go home OT Goal Formulation: With patient Time For Goal Achievement: 09/07/13 Potential to Achieve Goals: Good  Visit Information  Last OT Received On: 08/24/13 Assistance Needed: +2 PT/OT Co-Evaluation/Treatment: Yes History of Present Illness: Pt admit after fall with SDH and triquetral fracture of left wrist.         Prior Functioning     Home Living Family/patient expects to be discharged to:: Private residence Living Arrangements: Children;Other relatives Available Help at Discharge: Family;Available 24 hours/day Type of Home: House Home Access: Stairs to enter [employer] of Steps: 1 Entrance Stairs-Rails: None Home Layout: One level Home Equipment: Walker - 2 wheels;Bedside commode;Shower seat;Tub bench;Wheelchair - manual Prior Function Level of Independence: Independent with assistive device(s) Communication Communication: No difficulties Dominant Hand: Right         Vision/Perception Vision - History Baseline Vision: Wears glasses all the time Vision - Assessment Vision Assessment: Vision impaired - to be further tested in functional context    Cognition  Cognition Arousal/Alertness: Lethargic Behavior During Therapy: Flat affect Overall Cognitive Status: Impaired/Different from baseline Area of Impairment: Attention;Following commands;Safety/judgement;Awareness;Problem solving Current Attention Level:  Focused Following Commands: Follows one step commands inconsistently;Follows one step commands with increased time Safety/Judgement: Decreased awareness of safety;Decreased awareness of deficits Problem Solving: Slow processing;Decreased initiation;Requires tactile cues;Requires verbal cues;Difficulty sequencing    Extremity/Trunk Assessment Upper Extremity Assessment Upper Extremity Assessment: RUE deficits/detail;LUE deficits/detail RUE Deficits / Details: 2+/5 shoulder, 3/5 elbow to hand RUE Coordination: decreased fine motor;decreased gross motor LUE Deficits / Details: splinted from MPs to distal to elbow due to triquetrum fx. 2/5 strength shoulder, 3/5 elbow Lower Extremity Assessment Lower Extremity Assessment: Defer to PT evaluation     Mobility Bed Mobility Bed Mobility: Rolling Right;Right Sidelying to Sit;Sitting - Scoot to Edge of Bed Rolling Right: 3: Mod assist;With rail Right Sidelying to Sit: 1: +2 Total assist;HOB elevated;With rails Right Sidelying to Sit: Patient Percentage: 60% Sitting - Scoot to Edge of Bed: 2: Max assist Details for Bed Mobility Assistance: Pt needed incr assist for bed mobility.  Questionable at processing of information versus just poor endurance for activity.  Pt needed assist to move LEs as well as elevate trunk.   Transfers Sit to Stand: 1: +2 Total assist;With upper extremity assist;From bed Sit to Stand: Patient Percentage: 50% Stand to Sit: 1: +2 Total assist;With upper extremity assist;To chair/3-in-1;With armrests Stand to Sit: Patient Percentage: 50% Details for Transfer Assistance: Pt needed cues and assist for hand placement.  Pt with difficulty initiating standing.  Once  up, very poor balance reactions with pt leaning posteriorly much o f the time.  Pt needed UE support to stand and pivot to 3N1 with pt urinating while pivoting.  Once pt finished on 3N1, cleaned pt and changed gown and socks.  Pt stood again and was cleaned of urine and placed cream on pts bottom.  Pt then pivoted to recliner again needing UE suppport.  Pt needed cues to not use left UE as pt not remembering to not use it.  Pt needed assist to control descent into chair as well.       Exercise     Balance Balance Balance Assessed: Yes Static Sitting Balance Static Sitting - Balance Support: Bilateral upper extremity supported;Feet supported Static Sitting - Level of Assistance: 5: Stand by assistance Static Sitting - Comment/# of Minutes: 2 Static Standing Balance Static Standing - Balance Support: Bilateral upper extremity supported;During functional activity Static Standing - Level of Assistance: 3: Mod assist Static Standing - Comment/# of Minutes: Stood 2 minutes to be cleaned needing mod assist for stability secondary to poor balance reactions.     End of Session OT - End of Session Patient left: in chair;with call bell/phone within reach Nurse Communication: Patient requests pain meds;Mobility status (vomiting)  GO     Evern Bio 08/24/2013, 11:40 AM [phone]

## 2013-08-24 NOTE — Progress Notes (Signed)
Subjective: Patient reports nausea and Has been somewhat sluggish today. Fell backwards when ambulating with physical therapy. They feel she will require SNIF. CT scan of head ordered.  Objective: Vital signs in last 24 hours: Temp:  [97.5 F (36.4 C)-99 F (37.2 C)] 99 F (37.2 C) (10/01 1558) Pulse Rate:  [60-73] 73 (10/01 1700) Resp:  [13-23] 18 (10/01 1700) BP: (150-189)/(69-96) 152/77 mmHg (10/01 1700) SpO2:  [92 %-97 %] 95 % (10/01 1700)  Intake/Output from previous day: 09/30 0701 - 10/01 0700 In: 240 [P.O.:240] Out: 200 [Urine:200] Intake/Output this shift: Total I/O In: -  Out: 300 [Urine:300]  Somewhat lethargic. Arouses and will follow commands. Still no evidence of a drift on clinical exam. Will of consciousness however less than normal but may be secondary to pain medication.  Lab Results:  Recent Labs  08/22/13 1623 08/22/13 1639  WBC 6.1  --   HGB 15.4* 15.6*  HCT 45.1 46.0  PLT 142*  --    BMET  Recent Labs  08/22/13 1623 08/22/13 1639  NA 140 142  K 4.1 4.0  CL 102 103  CO2 29  --   GLUCOSE 222* 214*  BUN 22 22  CREATININE 0.70 0.70  CALCIUM 10.4  --     Studies/Results: X-ray Chest Pa And Lateral   08/22/2013   CLINICAL DATA:  Preop.  EXAM: CHEST  2 VIEW  COMPARISON:  None.  FINDINGS: Cardiomegaly. Mild hyperinflation of the lungs. Left basilar atelectasis, scarring or infiltrate. Right lung is clear. No effusions. No acute bony abnormality.  IMPRESSION: Left basilar opacity which could represent atelectasis, scarring or pneumonia.  Cardiomegaly, hyperinflation.   Electronically Signed   By: Charlett Nose M.D.   On: 08/22/2013 23:18   Ct Head Wo Contrast  08/24/2013   CLINICAL DATA:  Followup subdural hematoma. Fall.  EXAM: CT HEAD WITHOUT CONTRAST  TECHNIQUE: Contiguous axial images were obtained from the base of the skull through the vertex without intravenous contrast.  COMPARISON:  CT 08/22/2013  FINDINGS: 11 mm subdural hematoma on the  right is approximately the same size. This is beginning to liquify with a larger low-density component anteriorly.  Mass effect on the right hemisphere with 5.5 mm shift to the left unchanged. Ventricle size is normal.  No acute infarct or mass. No interval hemorrhage since the prior study. Negative for skull fracture.  IMPRESSION: 11 mm subdural hematoma on the right is approximately the same size. 5.5 mm midline shift to the left is unchanged. No new findings.   Electronically Signed   By: Marlan Palau M.D.   On: 08/24/2013 14:00    Assessment/Plan: CT scan shows stable subdural hematoma. Patient's level of consciousness leaves little to be desired.  LOS: 2 days  We'll start Decadron to see if this improves patient's overall status, if not she'll require surgery.   Tanja Gift J 08/24/2013, 6:30 PM

## 2013-08-25 LAB — GLUCOSE, CAPILLARY
Glucose-Capillary: 180 mg/dL — ABNORMAL HIGH (ref 70–99)
Glucose-Capillary: 258 mg/dL — ABNORMAL HIGH (ref 70–99)
Glucose-Capillary: 200 mg/dL — ABNORMAL HIGH (ref 70–99)
Glucose-Capillary: 170 mg/dL — ABNORMAL HIGH (ref 70–99)

## 2013-08-25 NOTE — Clinical Social Work Note (Signed)
Clinical Social Work Department BRIEF PSYCHOSOCIAL ASSESSMENT 08/25/2013  Patient:  UDELL, BLASINGAME     Account Number:  [id number]     Admit date:  08/22/2013  Clinical Social Worker:  Verl Blalock  Date/Time:  08/25/2013 04:00 PM  Referred by:  RN  Date Referred:  08/25/2013 Referred for  SNF Placement   Other Referral:   Interview type:  Patient Other interview type:   Spoke to patient daughter over the phone per patient request    PSYCHOSOCIAL DATA Living Status:  FAMILY Admitted from facility:   Level of care:   Primary support name:  Sherlonda, Flater  [phone] Primary support relationship to patient:  CHILD, ADULT Degree of support available:   Strong    CURRENT CONCERNS Current Concerns  Post-Acute Placement   Other Concerns:    SOCIAL WORK ASSESSMENT / PLAN Clinical Social Worker met with patient at bedside to offer support and discuss patient needs at discharge.  Patient states that she lives at home with her husband, who is wheelchair bound, her daughter, and grandchildren.  Patient was hopeful for return home, however agreeable to SNF placement in [facility] at this time.  CSW spoke with patient family over the phone who is also agreeable with SNF placement with preference to the [place] area. CSW to complete FL2 and initiate SNF search in [place].  CSW to provide bed offers once available.  CSW remains available for support and to facilitate patient discharge needs once medically stable.   Assessment/plan status:  Psychosocial Support/Ongoing Assessment of Needs Other assessment/ plan:   Information/referral to community resources:   Clinical Social Worker will provide patient daughter with facility list once bed offers available.    PATIENT'S/FAMILY'S RESPONSE TO PLAN OF CARE: Patient alert and oriented x3 laying in the bed.  Patient with good family support, but due to patient husband needs at home, patient daughter unable to manage  both parents and her children.  Patient and patient daughter seem understanding of social work role and appreciative for involvement.

## 2013-08-25 NOTE — Progress Notes (Signed)
Subjective: Patient reports Seems a bit better after Decadron. Still complains of headache, nausea appears less.  Objective: Vital signs in last 24 hours: Temp:  [97 F (36.1 C)-99 F (37.2 C)] 98 F (36.7 C) (10/02 0753) Pulse Rate:  [63-82] 77 (10/02 0800) Resp:  [15-23] 18 (10/02 0800) BP: (142-189)/(69-89) 159/77 mmHg (10/02 0800) SpO2:  [90 %-96 %] 94 % (10/02 0800)  Intake/Output from previous day: 10/01 0701 - 10/02 0700 In: -  Out: 300 [Urine:300] Intake/Output this shift:    No evidence of a cortical drift. Alertness and level of consciousness appears improved.  Lab Results:  Recent Labs  08/22/13 1623 08/22/13 1639  WBC 6.1  --   HGB 15.4* 15.6*  HCT 45.1 46.0  PLT 142*  --    BMET  Recent Labs  08/22/13 1623 08/22/13 1639  NA 140 142  K 4.1 4.0  CL 102 103  CO2 29  --   GLUCOSE 222* 214*  BUN 22 22  CREATININE 0.70 0.70  CALCIUM 10.4  --     Studies/Results: Ct Head Wo Contrast  08/24/2013   CLINICAL DATA:  Followup subdural hematoma. Fall.  EXAM: CT HEAD WITHOUT CONTRAST  TECHNIQUE: Contiguous axial images were obtained from the base of the skull through the vertex without intravenous contrast.  COMPARISON:  CT 08/22/2013  FINDINGS: 11 mm subdural hematoma on the right is approximately the same size. This is beginning to liquify with a larger low-density component anteriorly.  Mass effect on the right hemisphere with 5.5 mm shift to the left unchanged. Ventricle size is normal.  No acute infarct or mass. No interval hemorrhage since the prior study. Negative for skull fracture.  IMPRESSION: 11 mm subdural hematoma on the right is approximately the same size. 5.5 mm midline shift to the left is unchanged. No new findings.   Electronically Signed   By: Marlan Palau M.D.   On: 08/24/2013 14:00    Assessment/Plan: Continue to follow patient clinically.  LOS: 3 days  Physical therapy to assess this morning made progress in transfer to floor this  evening if remains stable. Will need skilled nursing facility for further recuperation.   Talibah Colasurdo J 08/25/2013, 9:08 AM

## 2013-08-25 NOTE — Progress Notes (Signed)
Physical Therapy Treatment Patient Details Name: Susan Odom MRN: [id number] DOB: [birth year] Today's Date: 08/25/2013 Time: 4098-1191 PT Time Calculation (min): 26 min  PT Assessment / Plan / Recommendation  History of Present Illness Pt admit after fall with SDH and triquetral fracture of left wrist.     PT Comments   Pt admitted with above. Pt currently with functional limitations due to continued balance and endurance deficits as well as cognitive issues limiting mobility. Pt surprised at how weak she is commenting, "I just don't know how I got this way."  Continue to recommend SNF.   Pt will benefit from skilled PT to increase their independence and safety with mobility to allow discharge to the venue listed below.   Follow Up Recommendations  SNF;Supervision/Assistance - 24 hour                 Equipment Recommendations  Other (comment) (TBA)        Frequency Min 3X/week   Progress towards PT Goals Progress towards PT goals: Progressing toward goals  Plan Current plan remains appropriate    Precautions / Restrictions Precautions Precautions: Fall Restrictions Weight Bearing Restrictions: No   Pertinent Vitals/Pain VSS, HA pain per pt    Mobility  Bed Mobility Bed Mobility: Rolling Right;Right Sidelying to Sit;Sitting - Scoot to Edge of Bed Rolling Right: 3: Mod assist;With rail Right Sidelying to Sit: 1: +2 Total assist;HOB elevated;With rails Right Sidelying to Sit: Patient Percentage: 60% Sitting - Scoot to Edge of Bed: 2: Max assist Details for Bed Mobility Assistance: Pt needed incr assist for bed mobility.  Questionable at processing of information versus just poor endurance for activity.  Pt needed assist to move LEs as well as elevate trunk.   Transfers Transfers: Stand to Sit;Sit to Stand;Stand Pivot Transfers Sit to Stand: 1: +2 Total assist;With upper extremity assist;From bed Sit to Stand: Patient Percentage: 50% Stand to Sit: 1: +2 Total assist;With  upper extremity assist;To chair/3-in-1;With armrests Stand to Sit: Patient Percentage: 50% Stand Pivot Transfers: 1: +2 Total assist Stand Pivot Transfers: Patient Percentage: 60% Details for Transfer Assistance: Pt needed cues and assist for hand placement.  Pt with difficulty initiating standing.  Once up, very poor balance reactions with pt again leaning posteriorly.  Pt needed UE support to stand and pivot to 3N1 with pt urinating while pivoting.  Once pt finished on 3N1, cleaned pt and changed gown and socks.  Pt stood again and was cleaned of urine and placed cream on pts bottom.  Pt then pivoted to recliner again needing UE suppport.  Pt needed cues to not use left UE as pt not remembering to not use it.  Pt needed assist to control descent into chair as well.   Ambulation/Gait Ambulation/Gait Assistance: Not tested (comment) Stairs: No Wheelchair Mobility Wheelchair Mobility: No Modified Rankin (Stroke Patients Only) Pre-Morbid Rankin Score: Moderate disability Modified Rankin: Moderately severe disability    PT Goals (current goals can now be found in the care plan section)    Visit Information  Last PT Received On: 08/25/13 Assistance Needed: +2 History of Present Illness: Pt admit after fall with SDH and triquetral fracture of left wrist.      Subjective Data  Subjective: "I feel so weak."   Cognition  Cognition Arousal/Alertness: Lethargic Behavior During Therapy: Flat affect Overall Cognitive Status: Impaired/Different from baseline Area of Impairment: Attention;Following commands;Safety/judgement;Awareness;Problem solving Current Attention Level: Focused Following Commands: Follows one step commands inconsistently;Follows one step commands with increased time  Safety/Judgement: Decreased awareness of safety;Decreased awareness of deficits Problem Solving: Slow processing;Decreased initiation;Requires tactile cues;Requires verbal cues;Difficulty sequencing    Balance   Static Sitting Balance Static Sitting - Balance Support: Bilateral upper extremity supported;Feet supported Static Sitting - Level of Assistance: 5: Stand by assistance Static Sitting - Comment/# of Minutes: 2 Static Standing Balance Static Standing - Balance Support: Bilateral upper extremity supported;During functional activity Static Standing - Level of Assistance: 3: Mod assist Static Standing - Comment/# of Minutes: Stood 2 min to be cleaned needing mod assist for stability secondary to bil knees flexed and with poor balance reactions.    End of Session PT - End of Session Equipment Utilized During Treatment: Gait belt Activity Tolerance: Patient limited by fatigue;Patient limited by lethargy;Patient limited by pain Patient left: in chair;with call bell/phone within reach Nurse Communication: Mobility status        INGOLD,Jazlin Tapscott 08/25/2013, 12:54 PM [facility] Acute Rehabilitation [phone] (pager)

## 2013-08-25 NOTE — Clinical Social Work Placement (Addendum)
Clinical Social Work Department CLINICAL SOCIAL WORK PLACEMENT NOTE 08/25/2013  Patient:  Susan Odom, Susan Odom  Account Number:  [id number] Admit date:  08/22/2013  Clinical Social Worker:  Macario Golds, LCSW  Date/time:  08/25/2013 04:30 PM  Clinical Social Work is seeking post-discharge placement for this patient at the following level of care:   SKILLED NURSING   (*CSW will update this form in Epic as items are completed)   08/25/2013  Patient/family provided with [facility] Department of Clinical Social Work's list of facilities offering this level of care within the geographic area requested by the patient (or if unable, by the patient's family).  08/25/2013  Patient/family informed of their freedom to choose among providers that offer the needed level of care, that participate in Medicare, Medicaid or managed care program needed by the patient, have an available bed and are willing to accept the patient.  08/25/2013  Patient/family informed of [facility]' ownership interest in [facility], as well as of the fact that they are under no obligation to receive care at this facility.  PASARR submitted to EDS on 08/25/2013 PASARR number received from [employer]DS on 08/25/2013  FL2 transmitted to all facilities in geographic area requested by pt/family on  08/25/2013 FL2 transmitted to all facilities within larger geographic area on   Patient informed that his/her managed care company has contracts with or will negotiate with  certain facilities, including the following:     Patient/family informed of bed offers received:  08/26/2013 Patient chooses bed at  Physician recommends and patient chooses bed at    Patient to be transferred to  on   Patient to be transferred to facility by   The following physician request were entered in Epic:   Additional Comments: 10/02 Patient preference is for [facility]

## 2013-08-26 LAB — GLUCOSE, CAPILLARY
Glucose-Capillary: 209 mg/dL — ABNORMAL HIGH (ref 70–99)
Glucose-Capillary: 239 mg/dL — ABNORMAL HIGH (ref 70–99)
Glucose-Capillary: 216 mg/dL — ABNORMAL HIGH (ref 70–99)
Glucose-Capillary: 175 mg/dL — ABNORMAL HIGH (ref 70–99)

## 2013-08-26 MED ORDER — DEXAMETHASONE SODIUM PHOSPHATE 4 MG/ML IJ SOLN
2.0000 mg | Freq: Two times a day (BID) | INTRAMUSCULAR | Status: DC
  Administered 2013-08-26 – 2013-08-29 (×6): 2 mg
  Filled 2013-08-26 (×7): qty 0.5

## 2013-08-26 MED ORDER — WHITE PETROLATUM GEL
Status: AC
  Administered 2013-08-26: 21:00:00
  Filled 2013-08-26: qty 5

## 2013-08-26 NOTE — Progress Notes (Signed)
Occupational Therapy Treatment Patient Details Name: Susan Odom MRN: [id number] DOB: [birth year] Today's Date: 08/26/2013 Time: 4098-1191 OT Time Calculation (min): 24 min  OT Assessment / Plan / Recommendation  History of present illness Pt admit after fall with SDH and triquetral fracture of left wrist.     OT comments   Focus of session on balance and postural control for ADL. Pt functioning @ emergent awareness level and able to sustain attention. Poor safety/judgment a dn awareness of deficits. Pt with R bias during standing. Requires +2 for mobility at this time. Cont to rec SNF for rehab. Will follow acutely.   Follow Up Recommendations  SNF    Barriers to Discharge       Equipment Recommendations  None recommended by OT    Recommendations for Other Services    Frequency Min 2X/week   Progress towards OT Goals Progress towards OT goals: Progressing toward goals  Plan Discharge plan remains appropriate    Precautions / Restrictions Precautions Precautions: Fall Restrictions Weight Bearing Restrictions: No   Pertinent Vitals/Pain no apparent distress     ADL  Grooming: Set up;Supervision/safety Where Assessed - Grooming: Supported sitting Transfers/Ambulation Related to ADLs: +2 pt @ 60% ADL Comments: c/o HA. cues for initiation and attention to task.     OT Diagnosis:    OT Problem List:   OT Treatment Interventions:     OT Goals(current goals can now be found in the care plan section) Acute Rehab OT Goals Patient Stated Goal: to go home OT Goal Formulation: With patient Time For Goal Achievement: 09/07/13 Potential to Achieve Goals: Good ADL Goals Pt Will Perform Eating: with set-up;sitting Pt Will Perform Grooming: with set-up;sitting Pt Will Perform Upper Body Bathing: with min assist;sitting Pt Will Perform Upper Body Dressing: with min assist;sitting Pt Will Transfer to Toilet: with min guard assist;ambulating;bedside commode Pt/caregiver will  Perform Home Exercise Program: Increased strength;Both right and left upper extremity Additional ADL Goal #1: Pt will perform bed mobility to EOB with min assist in prep for ADL.  Visit Information  Last OT Received On: 08/26/13 Assistance Needed: +2 History of Present Illness: Pt admit after fall with SDH and triquetral fracture of left wrist.      Subjective Data      Prior Functioning       Cognition  Cognition Arousal/Alertness: Awake/alert Behavior During Therapy: Flat affect Overall Cognitive Status: Impaired/Different from baseline Area of Impairment: Attention;Memory;Safety/judgement;Awareness;Problem solving Current Attention Level: Sustained Memory: Decreased recall of precautions;Decreased short-term memory Following Commands: Follows one step commands consistently Safety/Judgement: Decreased awareness of safety;Decreased awareness of deficits (thinks she can go home and care for her husband) Awareness: Emergent Problem Solving: Slow processing;Decreased initiation;Requires verbal cues General Comments: Improvement from eval    Mobility  Transfers Transfers: Sit to Stand;Stand to Sit Sit to Stand: From chair/3-in-1;1: +2 Total assist;With upper extremity assist;Other (comment) (vc to not push through L hand) Sit to Stand: Patient Percentage: 60% Stand to Sit: 1: +2 Total assist;To chair/3-in-1 Stand to Sit: Patient Percentage: 60% Details for Transfer Assistance: R bias    Exercises      Balance Static Standing Balance Static Standing - Balance Support: Bilateral upper extremity supported;During functional activity Static Standing - Level of Assistance: 3: Mod assist;Other (comment) Static Standing - Comment/# of Minutes: 7 (R bias) Using tactile cues to facilitate midline orientation for postural control  End of Session OT - End of Session Equipment Utilized During Treatment: Gait belt Activity Tolerance: Patient tolerated treatment  well Patient left: in  chair;with call bell/phone within reach Nurse Communication: Mobility status  GO     Tyniah Kastens,HILLARY 08/26/2013, 10:16 AM Luisa Dago, OTR/L  [phone] 08/26/2013

## 2013-08-26 NOTE — Progress Notes (Signed)
Orthopedic Tech Progress Note Patient Details:  Susan Odom [birth year] [id number]  Ortho Devices Type of Ortho Device: Velcro wrist splint Ortho Device/Splint Location: lue Ortho Device/Splint Interventions: Application   Amberlynn Tempesta 08/26/2013, 9:23 PM

## 2013-08-26 NOTE — Progress Notes (Signed)
Susan Odom [id number]  Transfer Data: 08/26/2013 11:30 AM  Attending Provider: Barnett Abu, MD  [id number],[id number] J., MD  Code Status: Full MIRZA KIDNEY is a 67 y.o. female patient transferred from Neuro ICU  -No acute distress noted.  -No complaints of shortness of breath.  -No complaints of chest pain.   Blood pressure 140/70, pulse 60, temperature 97.8 F (36.6 C), temperature source Axillary, resp. rate 16, height 5\' 10"  (1.778 m), weight 117.935 kg (260 lb), SpO2 100.00%.  ?  IV Fluids: Saline Locked Allergies: Review of patient's allergies indicates no known allergies.  Past Medical History:  has a past medical history of Hypertension and Diabetes mellitus.  Past Surgical History:  has no past surgical history on file.  Social History:  reports that she has never smoked. She does not have any smokeless tobacco history on file. She reports that she does not drink alcohol or use illicit drugs.  Skin: Stage 2 ulcer on sacrum that is healing. Patient orientated to room. Information packet given to patient/family. Admission inpatient armband information verified with patient/family to include name and date of birth and placed on patient arm. Side rails up x 2, fall assessment and education completed with patient/family. Patient able to verbalize understanding of risk associated with falls and verbalized understanding to call for assistance before getting out of bed. Call light within reach. Patient able to voice and demonstrate understanding of unit orientation instructions.  Will continue to evaluate and treat per MD orders.

## 2013-08-26 NOTE — Consult Note (Signed)
Susan Odom is an 67 y.o. female.   Chief Complaint: left wrist fracture HPI: 67 yo female states she fell approximately 1 week ago.  Admitted for subdural hematoma.  XR on admission revealed possible fracture in left wrist.  Reports previous left wrist fracture treated non operatively.  Minimal complaint regarding left wrist.    Past Medical History  Diagnosis Date  . Hypertension   . Diabetes mellitus     History reviewed. No pertinent past surgical history.  History reviewed. No pertinent family history. Social History:  reports that she has never smoked. She does not have any smokeless tobacco history on file. She reports that she does not drink alcohol or use illicit drugs.  Allergies: No Known Allergies  Medications Prior to Admission  Medication Sig Dispense Refill  . acetaminophen (TYLENOL) 500 MG tablet Take 1,000 mg by mouth every 6 (six) hours as needed for pain.      Marland Kitchen ALPRAZolam (XANAX) 1 MG tablet Take 1 mg by mouth 4 (four) times daily.      Marland Kitchen losartan (COZAAR) 100 MG tablet Take 100 mg by mouth daily.      . metFORMIN (GLUCOPHAGE) 500 MG tablet Take 1,000 mg by mouth 2 (two) times daily.      Marland Kitchen oxyCODONE-acetaminophen (PERCOCET) 5-325 MG per tablet Take 1 tablet by mouth every 4 (four) hours as needed for pain.        Results for orders placed during the hospital encounter of 08/22/13 (from the past 48 hour(s))  GLUCOSE, CAPILLARY     Status: Abnormal   Collection Time    08/24/13 10:48 PM      Result Value Range   Glucose-Capillary 208 (*) 70 - 99 mg/dL   Comment 1 Notify RN     Comment 2 Documented in Chart    GLUCOSE, CAPILLARY     Status: Abnormal   Collection Time    08/25/13  7:52 AM      Result Value Range   Glucose-Capillary 180 (*) 70 - 99 mg/dL  GLUCOSE, CAPILLARY     Status: Abnormal   Collection Time    08/25/13 11:51 AM      Result Value Range   Glucose-Capillary 258 (*) 70 - 99 mg/dL  GLUCOSE, CAPILLARY     Status: Abnormal   Collection  Time    08/25/13  5:13 PM      Result Value Range   Glucose-Capillary 200 (*) 70 - 99 mg/dL   Comment 1 Notify RN     Comment 2 Documented in Chart    GLUCOSE, CAPILLARY     Status: Abnormal   Collection Time    08/25/13 10:44 PM      Result Value Range   Glucose-Capillary 170 (*) 70 - 99 mg/dL   Comment 1 Notify RN     Comment 2 Documented in Chart    GLUCOSE, CAPILLARY     Status: Abnormal   Collection Time    08/26/13  7:40 AM      Result Value Range   Glucose-Capillary 209 (*) 70 - 99 mg/dL  GLUCOSE, CAPILLARY     Status: Abnormal   Collection Time    08/26/13 11:45 AM      Result Value Range   Glucose-Capillary 239 (*) 70 - 99 mg/dL   Comment 1 Documented in Chart     Comment 2 Notify RN    GLUCOSE, CAPILLARY     Status: Abnormal   Collection Time  08/26/13  4:34 PM      Result Value Range   Glucose-Capillary 216 (*) 70 - 99 mg/dL   Comment 1 Documented in Chart     Comment 2 Notify RN      No results found.   A comprehensive review of systems was negative except for: Neurological: positive for headaches  Blood pressure 130/76, pulse 78, temperature 97.1 F (36.2 C), temperature source Axillary, resp. rate 17, height 5\' 10"  (1.778 m), weight 260 lb (117.935 kg), SpO2 100.00%.  General appearance: alert, cooperative and appears stated age Head: Normocephalic, without obvious abnormality, atraumatic Neck: supple, symmetrical, trachea midline Extremities: intact sensation and capillary refill all digits.  +epl/fpl/io.  right ue: no ttp.  no wounds.  left ue: ecchymosis dorsal wrist.  ttp at triquetrum.  no tenderness at anatomic snuffbox, sl or lt interval, ulnar styloid, distal radius or ulna or metacarpals or digits.  no wounds.   Pulses: 2+ and symmetric Skin: Skin color, texture, turgor normal. No rashes or lesions Neurologic: Grossly normal Incision/Wound: na  Assessment/Plan Left wrist triquetral avulsion fracture.  Discussed nature of injury with  patient.  Recommend removable splint.  Follow up as outpatient.    Huxley Shurley R 08/26/2013, 7:39 PM

## 2013-08-26 NOTE — Progress Notes (Signed)
Inpatient Diabetes Program Recommendations  AACE/ADA: New Consensus Statement on Inpatient Glycemic Control (2013)  Target Ranges:  Prepandial:   less than 140 mg/dL      Peak postprandial:   less than 180 mg/dL (1-2 hours)      Critically ill patients:  140 - 180 mg/dL   Reason for Visit: Hyperglycemia  Results for LOKELANI, LUTES (MRN [id number]) as of 08/26/2013 09:20  Ref. Range 08/25/2013 07:52 08/25/2013 11:51 08/25/2013 17:13 08/25/2013 22:44 08/26/2013 07:40  Glucose-Capillary Latest Range: 70-99 mg/dL 782 (H) 956 (H) 213 (H) 170 (H) 209 (H)   Sub-optimal blood sugars.  Recommendations: Check HgbA1C to assess glycemic control prior to hospitalization May need small amount of basal insulin if FBS >180 mg/dL.  Will follow. Thank you. Ailene Ards, RD, LDN, CDE Inpatient Diabetes Coordinator [phone]

## 2013-08-26 NOTE — Clinical Social Work Note (Signed)
Clinical Social Worker continuing to follow patient and family for support and discharge planning needs.  CSW provided bed offers to patient and patient daughter and they are in agreement with [facility].  CSW to follow up with patient and patient daughter at time of discharge to confirm discharge plan.  CSW has communicated patient and family wishes with facility.  CSW has relayed patient information to unit Child psychotherapist.  CSW remains available for support and to facilitate patient discharge needs once medically ready.  Macario Golds, [place] [phone]

## 2013-08-26 NOTE — Progress Notes (Signed)
Subjective: Patient reports feels better. Still with headache however nausea is much improved. We'll taper Decadron.  Objective: Vital signs in last 24 hours: Temp:  [97.1 F (36.2 C)-97.8 F (36.6 C)] 97.1 F (36.2 C) (10/03 1400) Pulse Rate:  [50-88] 78 (10/03 1400) Resp:  [15-22] 17 (10/03 1400) BP: (94-173)/(63-82) 130/76 mmHg (10/03 1400) SpO2:  [90 %-100 %] 100 % (10/03 1400)  Intake/Output from previous day: 10/02 0701 - 10/03 0700 In: 120 [P.O.:120] Out: 400 [Urine:400] Intake/Output this shift: Total I/O In: 600 [P.O.:600] Out: 150 [Urine:150]  No drift motor function intact pupils 3 mm and briskly reactive extraocular movements are full  Lab Results: No results found for this basename: WBC, HGB, HCT, PLT,  in the last 72 hours BMET No results found for this basename: NA, K, CL, CO2, GLUCOSE, BUN, CREATININE, CALCIUM,  in the last 72 hours  Studies/Results: No results found.  Assessment/Plan: Continue to observe subdural hematoma. Plan transfer to skilled nursing on Monday.  LOS: 4 days  Wean Decadron, hand surgeon to see regarding questionable fracture on left.   Dian Minahan J 08/26/2013, 5:14 PM

## 2013-08-27 LAB — GLUCOSE, CAPILLARY
Glucose-Capillary: 219 mg/dL — ABNORMAL HIGH (ref 70–99)
Glucose-Capillary: 163 mg/dL — ABNORMAL HIGH (ref 70–99)
Glucose-Capillary: 212 mg/dL — ABNORMAL HIGH (ref 70–99)
Glucose-Capillary: 126 mg/dL — ABNORMAL HIGH (ref 70–99)

## 2013-08-27 NOTE — Progress Notes (Signed)
Doing well. No C/o  Temp:  [97.1 F (36.2 C)-98.6 F (37 C)] 98.6 F (37 C) (10/04 0500) Pulse Rate:  [60-88] 74 (10/04 0500) Resp:  [15-19] 18 (10/04 0500) BP: (94-140)/(63-81) 128/79 mmHg (10/04 0500) SpO2:  [94 %-100 %] 100 % (10/04 0500) AAOx3  - no drift  Plan: Pt stable  - SNF Monday

## 2013-08-28 LAB — GLUCOSE, CAPILLARY
Glucose-Capillary: 174 mg/dL — ABNORMAL HIGH (ref 70–99)
Glucose-Capillary: 191 mg/dL — ABNORMAL HIGH (ref 70–99)
Glucose-Capillary: 244 mg/dL — ABNORMAL HIGH (ref 70–99)

## 2013-08-29 LAB — GLUCOSE, CAPILLARY
Glucose-Capillary: 161 mg/dL — ABNORMAL HIGH (ref 70–99)
Glucose-Capillary: 177 mg/dL — ABNORMAL HIGH (ref 70–99)
Glucose-Capillary: 172 mg/dL — ABNORMAL HIGH (ref 70–99)
Glucose-Capillary: 183 mg/dL — ABNORMAL HIGH (ref 70–99)

## 2013-08-29 MED ORDER — DEXAMETHASONE 1 MG PO TABS: ORAL_TABLET | ORAL | Status: DC

## 2013-08-29 MED ORDER — HYDROCODONE-ACETAMINOPHEN 5-325 MG PO TABS: 1.0000 | ORAL_TABLET | Freq: Four times a day (QID) | ORAL | Status: DC | PRN

## 2013-08-29 NOTE — Progress Notes (Signed)
Discussed pt progress, and changes to dc plan with Tresa Endo, PTA, and agree; Case Mgr aware of dc plan changed to home as well;   Thanks,  [place], [place] [phone]

## 2013-08-29 NOTE — Progress Notes (Signed)
Physical Therapy Treatment Patient Details Name: Susan Odom MRN: [id number] DOB: August 31, 1946 Today's Date: 08/29/2013 Time: 1314-1400  PT Time Calculation (min): 46 min  PT Assessment / Plan / Recommendation  History of Present Illness Pt admit after fall with SDH and triquetral fracture of left wrist.     PT Comments   Pt agreeable to participate in therapy this afternoon.  Spoke with pt Re: possible d/c home & she states her daughter is there 24/7 to assist with her husband but she is able to also provide assistance to her.  Additional time for d/c planning.  Attempted to contact daughter via phone immediately after therapy but unable to reach.  Pt's daughter present in pt's room at this time.  Daughter confirmed she is there 24/7 & she feels comfortable with assisting pt at home.  D/c plans updated to home with HHPT/HHOT, RW with Lt platform.     Follow Up Recommendations  HHPT, HHOT, RW with Lt platform piece     Does the patient have the potential to tolerate intense rehabilitation     Barriers to Discharge        Equipment Recommendations   (L platform RW)    Recommendations for Other Services    Frequency Min 3X/week   Progress towards PT Goals Progress towards PT goals: Progressing toward goals  Plan Current plan remains appropriate (Will consider updating plan to dc home     Precautions / Restrictions Precautions Precautions: Fall Precaution Comments: No specific WBing status on order for L wrist; Will proceed with platform L unless otherwise ordered Restrictions Other Position/Activity Restrictions: No specific WBing status on order for L wrist; Will proceed with platform L unless otherwise ordered       Mobility  Bed Mobility Bed Mobility: Not assessed Transfers Transfers: Sit to Stand;Stand to Sit Sit to Stand: 4: Min guard;With upper extremity assist;With armrests;From chair/3-in-1 Stand to Sit: 4: Min guard;With upper extremity assist;With armrests;To  chair/3-in-1 Details for Transfer Assistance: Much improved with much less need for physical assist than previous session; reinforced minimizing use of LUE  Ambulation/Gait Ambulation/Gait Assistance: 4: Min guard Ambulation Distance (Feet): 150 Feet Assistive device: Left platform walker Ambulation/Gait Assistance Details: Mild instability at times but no overt LOB Gait Pattern: Step-through pattern;Decreased stride length Gait velocity: approaches WNL Stairs: No Wheelchair Mobility Wheelchair Mobility: No      PT Goals (current goals can now be found in the care plan section) Acute Rehab PT Goals Patient Stated Goal: to go home PT Goal Formulation: With patient Time For Goal Achievement: 09/07/13 Potential to Achieve Goals: Good  Visit Information  Last PT Received On: 08/29/13 Assistance Needed: +1 History of Present Illness: Pt admit after fall with SDH and triquetral fracture of left wrist.      Subjective Data  Subjective: Reports feels better and wants to go home Patient Stated Goal: to go home   Cognition  Cognition Arousal/Alertness: Awake/alert Behavior During Therapy: WFL for tasks assessed/performed Overall Cognitive Status: No family/caregiver present to determine baseline cognitive functioning [facility] for simple mobility tasks) Memory: Decreased recall of precautions;Decreased short-term memory Following Commands: Follows one step commands consistently Problem Solving:  (much improved today) General Comments: When asked who she would call in case of emergency pt stated "well maybe my son" (looking for 9-1-1).      Balance     End of Session PT - End of Session Equipment Utilized During Treatment: Gait belt Activity Tolerance: Patient tolerated treatment well Patient left:  in chair;with call bell/phone within reach Nurse Communication: Mobility status   GP     Lara Mulch 08/29/2013, 2:24 PM  Verdell Face, PTA [phone] 08/29/2013

## 2013-08-29 NOTE — Progress Notes (Signed)
SW received a consult for possible placement. PT  At this time is recommending home with HH and not SNF. CSW will make CM aware. Clinical Social Worker will sign off for now as social work intervention is no longer needed. Please consult us again if new need arises.   Susan Odom, MSW [phone] 

## 2013-08-29 NOTE — Discharge Summary (Signed)
Physician Discharge Summary  Patient ID: Susan Odom MRN: [id number] DOB/AGE: 1946/11/05 67 y.o.  Admit date: 08/22/2013 Discharge date: 08/29/2013  Admission Diagnoses: Acute and subacute right subdural hematoma, triqetral bone fx left hand  Discharge Diagnoses: Acute and subacute right subdural hematoma. Triquetral bone fracture left hand. Diabetes mellitus. Active Problems:   * No active hospital problems. *   Discharged Condition: good  Hospital Course: Patient was admitted to undergo treatment for an acute and subacute subdural hematoma. She presented with nausea vomiting and headache. She was stabilized medically and it seemed that her nausea improved. She did not receive surgery. Followup CT scan demonstrates stability of the subdural hematoma. It is felt that this represents a subacute condition. She'll be treated in a skilled nursing facility. She'll need followup in approximately 3 weeks' time with a repeat CAT scan of the brain.  Consults: orthopedic surgery  Significant Diagnostic Studies: CT head.  Treatments: Observation  Discharge Exam: Blood pressure 155/71, pulse 58, temperature 98 F (36.7 C), temperature source Oral, resp. rate 18, height 5\' 10"  (1.778 m), weight 117.935 kg (260 lb), SpO2 95.00%. Left upper extremity is in a removable splint. Cranial nerve examination is normal. The face is symmetric. Motor strength is symmetric with no evidence of an drift. The patient is ambulatory with assistance.  Disposition: Skilled nursing facility  Discharge Orders   Future Orders Complete By Expires   Call MD for:  persistant nausea and vomiting  As directed    Call MD for:  severe uncontrolled pain  As directed    Call MD for:  temperature >100.4  As directed    Diet - low sodium heart healthy  As directed    Increase activity slowly  As directed        Medication List    STOP taking these medications       PERCOCET 5-325 MG per tablet  Generic drug:   oxyCODONE-acetaminophen      TAKE these medications       acetaminophen 500 MG tablet  Commonly known as:  TYLENOL  Take 1,000 mg by mouth every 6 (six) hours as needed for pain.     ALPRAZolam 1 MG tablet  Commonly known as:  XANAX  Take 1 mg by mouth 4 (four) times daily.     dexamethasone 1 MG tablet  Commonly known as:  DECADRON  2 tablets twice daily for 2 days, one tablet twice daily for 2 days, one tablet daily for 2 days.     HYDROcodone-acetaminophen 5-325 MG per tablet  Commonly known as:  NORCO/VICODIN  Take 1 tablet by mouth every 6 (six) hours as needed (Severe pain).     losartan 100 MG tablet  Commonly known as:  COZAAR  Take 100 mg by mouth daily.     metFORMIN 500 MG tablet  Commonly known as:  GLUCOPHAGE  Take 1,000 mg by mouth 2 (two) times daily.         SignedStefani Dama 08/29/2013, 8:03 AM

## 2013-08-29 NOTE — Progress Notes (Signed)
10/06/4 Contacted Alic Hilburn with [facility] and set up HHPT and HHOT. [facility] to deliver rolling walker with left platform to patient's room prior to discharge this pm. Jacquelynn Cree RN, BSN, CCM  08/29/2013 [address] RN, [place]  [phone] Met with patient and daughter regarding discharge plan and possible plan for discharg to home with HHPT,OT They selected [facility] for home health agency. DME: RW with left platform, called [facility] with referral          3:1 patient has one at home.  08/29/2013  1330 Darlyne Russian RN, [place]  [phone] Plan for discharge to SNF. Per PT updated  recommendations on 08/29/13  Home Health PT and OT  1515 Call to Dr Danielle Dess left message with update on PT recommendations.

## 2013-08-29 NOTE — Progress Notes (Signed)
Physical Therapy Treatment Patient Details Name: Susan Odom MRN: [id number] DOB: 12-10-1945 Today's Date: 08/29/2013 Time: 1112-1129 PT Time Calculation (min): 17 min  PT Assessment / Plan / Recommendation  History of Present Illness Pt admit after fall with SDH and triquetral fracture of left wrist.     PT Comments   Excellent progress with functional mobility, balance, and activity tolerance, with household distances ambulated today; Will try for a second session today to assess amb with a platform RW; needed reinforcement of minimizing use of LUE  Considering her progress, and the fact that she very much wants to dc home -- if pt has reliable 24 hour assist at home, we can amend our dc plan to Home with HHPT/OT; Discussed with Case Mgrs  Follow Up Recommendations  SNF;Supervision/Assistance - 24 hour;Other (comment) (Does pt have reliable 24 hour assist at home?)     Does the patient have the potential to tolerate intense rehabilitation     Barriers to Discharge        Equipment Recommendations   (L platform RW)    Recommendations for Other Services    Frequency Min 3X/week   Progress towards PT Goals Progress towards PT goals: Progressing toward goals  Plan Current plan remains appropriate (Will consider updating plan to dc home if pt has reliable assist)    Precautions / Restrictions Precautions Precautions: Fall Precaution Comments: No specific WBing status on order for L wrist; Will proceed with platform L unless otherwise ordered Restrictions Other Position/Activity Restrictions: No specific WBing status on order for L wrist; Will proceed with platform L unless otherwise ordered   Pertinent Vitals/Pain no apparent distress     Mobility  Transfers Transfers: Sit to Stand;Stand to Sit Sit to Stand: 4: Min guard;From chair/3-in-1;With upper extremity assist Stand to Sit: 4: Min guard;To chair/3-in-1;With upper extremity assist Details for Transfer Assistance:  Much improved with much less need for physical assist than previous session Ambulation/Gait Ambulation/Gait Assistance: 4: Min guard Ambulation Distance (Feet): 100 Feet Assistive device: Rolling walker;1 person hand held assist Ambulation/Gait Assistance Details: great improvements, though still unsteady and benefits from bilateral UE support; Will work with L platform next session Gait velocity: approaches WNL    Exercises     PT Diagnosis:    PT Problem List:   PT Treatment Interventions:     PT Goals (current goals can now be found in the care plan section) Acute Rehab PT Goals Patient Stated Goal: to go home Time For Goal Achievement: 09/07/13 Potential to Achieve Goals: Good  Visit Information  Last PT Received On: 08/29/13 Assistance Needed: +1 History of Present Illness: Pt admit after fall with SDH and triquetral fracture of left wrist.      Subjective Data  Subjective: Reports feels better and wants to go home Patient Stated Goal: to go home   Cognition  Cognition Arousal/Alertness: Awake/alert Behavior During Therapy: WFL for tasks assessed/performed Overall Cognitive Status: Within Functional Limits for tasks assessed (for simple mobility tasks) Memory: Decreased recall of precautions;Decreased short-term memory Following Commands: Follows one step commands consistently Problem Solving:  (much improved today) General Comments: Improvement from eval    Balance     End of Session PT - End of Session Equipment Utilized During Treatment: Gait belt Activity Tolerance: Patient tolerated treatment well Patient left: in chair;with call bell/phone within reach Nurse Communication: Mobility status   GP     Van Clines [facility] [place], [place] [phone]  08/29/2013, 1:09 PM

## 2013-09-20 ENCOUNTER — Other Ambulatory Visit: Payer: Self-pay | Admitting: Neurological Surgery

## 2013-09-20 DIAGNOSIS — S065X9A Traumatic subdural hemorrhage with loss of consciousness of unspecified duration, initial encounter: Secondary | ICD-10-CM

## 2013-10-07 ENCOUNTER — Other Ambulatory Visit: Payer: Medicare Other

## 2013-10-07 ENCOUNTER — Other Ambulatory Visit (HOSPITAL_COMMUNITY): Payer: Self-pay | Admitting: Neurological Surgery

## 2013-10-07 DIAGNOSIS — I6200 Nontraumatic subdural hemorrhage, unspecified: Secondary | ICD-10-CM

## 2013-10-17 ENCOUNTER — Ambulatory Visit (HOSPITAL_COMMUNITY)
Admission: RE | Admit: 2013-10-17 | Discharge: 2013-10-17 | Disposition: A | Discharge status: Home / Self Care | Payer: Medicare Other | Source: Ambulatory Visit | Attending: Neurological Surgery | Admitting: Neurological Surgery

## 2013-10-17 DIAGNOSIS — I6200 Nontraumatic subdural hemorrhage, unspecified: Secondary | ICD-10-CM | POA: Insufficient documentation

## 2013-10-17 DIAGNOSIS — Z09 Encounter for follow-up examination after completed treatment for conditions other than malignant neoplasm: Secondary | ICD-10-CM | POA: Insufficient documentation

## 2014-02-27 ENCOUNTER — Other Ambulatory Visit (HOSPITAL_COMMUNITY): Payer: Self-pay | Admitting: Internal Medicine

## 2014-02-27 DIAGNOSIS — Z0000 Encounter for general adult medical examination without abnormal findings: Secondary | ICD-10-CM

## 2014-03-02 ENCOUNTER — Ambulatory Visit (HOSPITAL_COMMUNITY): Payer: Medicare Other

## 2014-12-13 DIAGNOSIS — Z6838 Body mass index (BMI) 38.0-38.9, adult: Secondary | ICD-10-CM | POA: Diagnosis not present

## 2014-12-13 DIAGNOSIS — W64XXXA Exposure to other animate mechanical forces, initial encounter: Secondary | ICD-10-CM | POA: Diagnosis not present

## 2014-12-13 DIAGNOSIS — G894 Chronic pain syndrome: Secondary | ICD-10-CM | POA: Diagnosis not present

## 2014-12-13 DIAGNOSIS — M1991 Primary osteoarthritis, unspecified site: Secondary | ICD-10-CM | POA: Diagnosis not present

## 2015-01-12 DIAGNOSIS — Z6838 Body mass index (BMI) 38.0-38.9, adult: Secondary | ICD-10-CM | POA: Diagnosis not present

## 2015-01-12 DIAGNOSIS — G894 Chronic pain syndrome: Secondary | ICD-10-CM | POA: Diagnosis not present

## 2015-01-12 DIAGNOSIS — I10 Essential (primary) hypertension: Secondary | ICD-10-CM | POA: Diagnosis not present

## 2015-02-12 DIAGNOSIS — G894 Chronic pain syndrome: Secondary | ICD-10-CM | POA: Diagnosis not present

## 2015-02-12 DIAGNOSIS — E119 Type 2 diabetes mellitus without complications: Secondary | ICD-10-CM | POA: Diagnosis not present

## 2015-02-12 DIAGNOSIS — W57XXXA Bitten or stung by nonvenomous insect and other nonvenomous arthropods, initial encounter: Secondary | ICD-10-CM | POA: Diagnosis not present

## 2015-02-12 DIAGNOSIS — Z6839 Body mass index (BMI) 39.0-39.9, adult: Secondary | ICD-10-CM | POA: Diagnosis not present

## 2015-02-12 DIAGNOSIS — F419 Anxiety disorder, unspecified: Secondary | ICD-10-CM | POA: Diagnosis not present

## 2015-03-13 DIAGNOSIS — G894 Chronic pain syndrome: Secondary | ICD-10-CM | POA: Diagnosis not present

## 2015-03-13 DIAGNOSIS — Z6838 Body mass index (BMI) 38.0-38.9, adult: Secondary | ICD-10-CM | POA: Diagnosis not present

## 2015-03-13 DIAGNOSIS — I10 Essential (primary) hypertension: Secondary | ICD-10-CM | POA: Diagnosis not present

## 2015-03-13 DIAGNOSIS — E119 Type 2 diabetes mellitus without complications: Secondary | ICD-10-CM | POA: Diagnosis not present

## 2015-03-13 DIAGNOSIS — E669 Obesity, unspecified: Secondary | ICD-10-CM | POA: Diagnosis not present

## 2015-06-08 ENCOUNTER — Other Ambulatory Visit (HOSPITAL_COMMUNITY): Payer: Self-pay | Admitting: Internal Medicine

## 2015-06-08 DIAGNOSIS — Z1389 Encounter for screening for other disorder: Secondary | ICD-10-CM | POA: Diagnosis not present

## 2015-06-08 DIAGNOSIS — Z6837 Body mass index (BMI) 37.0-37.9, adult: Secondary | ICD-10-CM | POA: Diagnosis not present

## 2015-06-08 DIAGNOSIS — I10 Essential (primary) hypertension: Secondary | ICD-10-CM | POA: Diagnosis not present

## 2015-06-08 DIAGNOSIS — Z1231 Encounter for screening mammogram for malignant neoplasm of breast: Secondary | ICD-10-CM

## 2015-06-08 DIAGNOSIS — M1991 Primary osteoarthritis, unspecified site: Secondary | ICD-10-CM | POA: Diagnosis not present

## 2015-06-08 DIAGNOSIS — E119 Type 2 diabetes mellitus without complications: Secondary | ICD-10-CM | POA: Diagnosis not present

## 2015-06-08 DIAGNOSIS — B353 Tinea pedis: Secondary | ICD-10-CM | POA: Diagnosis not present

## 2015-06-08 DIAGNOSIS — L739 Follicular disorder, unspecified: Secondary | ICD-10-CM | POA: Diagnosis not present

## 2015-06-08 DIAGNOSIS — G894 Chronic pain syndrome: Secondary | ICD-10-CM | POA: Diagnosis not present

## 2015-06-18 ENCOUNTER — Ambulatory Visit (HOSPITAL_COMMUNITY): Payer: Self-pay

## 2015-07-09 DIAGNOSIS — E1140 Type 2 diabetes mellitus with diabetic neuropathy, unspecified: Secondary | ICD-10-CM | POA: Diagnosis not present

## 2015-07-09 DIAGNOSIS — L0100 Impetigo, unspecified: Secondary | ICD-10-CM | POA: Diagnosis not present

## 2015-07-09 DIAGNOSIS — L739 Follicular disorder, unspecified: Secondary | ICD-10-CM | POA: Diagnosis not present

## 2015-07-09 DIAGNOSIS — Z1389 Encounter for screening for other disorder: Secondary | ICD-10-CM | POA: Diagnosis not present

## 2015-07-09 DIAGNOSIS — D510 Vitamin B12 deficiency anemia due to intrinsic factor deficiency: Secondary | ICD-10-CM | POA: Diagnosis not present

## 2015-07-09 DIAGNOSIS — Z6837 Body mass index (BMI) 37.0-37.9, adult: Secondary | ICD-10-CM | POA: Diagnosis not present

## 2015-07-09 DIAGNOSIS — G894 Chronic pain syndrome: Secondary | ICD-10-CM | POA: Diagnosis not present

## 2015-07-09 DIAGNOSIS — N182 Chronic kidney disease, stage 2 (mild): Secondary | ICD-10-CM | POA: Diagnosis not present

## 2015-07-09 DIAGNOSIS — M7581 Other shoulder lesions, right shoulder: Secondary | ICD-10-CM | POA: Diagnosis not present

## 2015-07-10 DIAGNOSIS — D696 Thrombocytopenia, unspecified: Secondary | ICD-10-CM | POA: Diagnosis not present

## 2015-07-12 DIAGNOSIS — Z6837 Body mass index (BMI) 37.0-37.9, adult: Secondary | ICD-10-CM | POA: Diagnosis not present

## 2015-07-12 DIAGNOSIS — E119 Type 2 diabetes mellitus without complications: Secondary | ICD-10-CM | POA: Diagnosis not present

## 2015-07-12 DIAGNOSIS — Z1389 Encounter for screening for other disorder: Secondary | ICD-10-CM | POA: Diagnosis not present

## 2015-07-12 DIAGNOSIS — F419 Anxiety disorder, unspecified: Secondary | ICD-10-CM | POA: Diagnosis not present

## 2015-07-12 DIAGNOSIS — E538 Deficiency of other specified B group vitamins: Secondary | ICD-10-CM | POA: Diagnosis not present

## 2015-07-12 DIAGNOSIS — Z0001 Encounter for general adult medical examination with abnormal findings: Secondary | ICD-10-CM | POA: Diagnosis not present

## 2015-07-18 ENCOUNTER — Ambulatory Visit (HOSPITAL_COMMUNITY): Payer: Self-pay

## 2015-07-19 ENCOUNTER — Ambulatory Visit (HOSPITAL_COMMUNITY): Payer: Self-pay

## 2015-08-09 DIAGNOSIS — M1991 Primary osteoarthritis, unspecified site: Secondary | ICD-10-CM | POA: Diagnosis not present

## 2015-08-09 DIAGNOSIS — Z1389 Encounter for screening for other disorder: Secondary | ICD-10-CM | POA: Diagnosis not present

## 2015-08-09 DIAGNOSIS — N182 Chronic kidney disease, stage 2 (mild): Secondary | ICD-10-CM | POA: Diagnosis not present

## 2015-08-09 DIAGNOSIS — E1140 Type 2 diabetes mellitus with diabetic neuropathy, unspecified: Secondary | ICD-10-CM | POA: Diagnosis not present

## 2015-08-09 DIAGNOSIS — Z6837 Body mass index (BMI) 37.0-37.9, adult: Secondary | ICD-10-CM | POA: Diagnosis not present

## 2015-08-09 DIAGNOSIS — F1120 Opioid dependence, uncomplicated: Secondary | ICD-10-CM | POA: Diagnosis not present

## 2015-08-09 DIAGNOSIS — I10 Essential (primary) hypertension: Secondary | ICD-10-CM | POA: Diagnosis not present

## 2015-08-13 ENCOUNTER — Ambulatory Visit (HOSPITAL_COMMUNITY): Payer: Self-pay

## 2015-08-16 ENCOUNTER — Ambulatory Visit (HOSPITAL_COMMUNITY): Payer: Self-pay

## 2015-09-21 DIAGNOSIS — H5203 Hypermetropia, bilateral: Secondary | ICD-10-CM | POA: Diagnosis not present

## 2015-09-21 DIAGNOSIS — H52222 Regular astigmatism, left eye: Secondary | ICD-10-CM | POA: Diagnosis not present

## 2015-09-21 DIAGNOSIS — E119 Type 2 diabetes mellitus without complications: Secondary | ICD-10-CM | POA: Diagnosis not present

## 2015-09-21 DIAGNOSIS — E1129 Type 2 diabetes mellitus with other diabetic kidney complication: Secondary | ICD-10-CM | POA: Diagnosis not present

## 2015-09-21 DIAGNOSIS — H2510 Age-related nuclear cataract, unspecified eye: Secondary | ICD-10-CM | POA: Diagnosis not present

## 2015-10-24 ENCOUNTER — Ambulatory Visit (HOSPITAL_COMMUNITY): Payer: Self-pay

## 2015-10-24 ENCOUNTER — Encounter (HOSPITAL_COMMUNITY): Payer: Self-pay

## 2015-10-29 ENCOUNTER — Ambulatory Visit (HOSPITAL_COMMUNITY)
Admission: RE | Admit: 2015-10-29 | Discharge: 2015-10-29 | Disposition: A | Discharge status: Home / Self Care | Payer: Medicare Other | Source: Ambulatory Visit | Attending: Internal Medicine | Admitting: Internal Medicine

## 2015-10-29 DIAGNOSIS — Z1231 Encounter for screening mammogram for malignant neoplasm of breast: Secondary | ICD-10-CM | POA: Diagnosis not present

## 2015-11-02 DIAGNOSIS — Z6837 Body mass index (BMI) 37.0-37.9, adult: Secondary | ICD-10-CM | POA: Diagnosis not present

## 2015-11-02 DIAGNOSIS — G894 Chronic pain syndrome: Secondary | ICD-10-CM | POA: Diagnosis not present

## 2015-11-02 DIAGNOSIS — E1140 Type 2 diabetes mellitus with diabetic neuropathy, unspecified: Secondary | ICD-10-CM | POA: Diagnosis not present

## 2015-11-02 DIAGNOSIS — Z1389 Encounter for screening for other disorder: Secondary | ICD-10-CM | POA: Diagnosis not present

## 2015-11-02 DIAGNOSIS — I10 Essential (primary) hypertension: Secondary | ICD-10-CM | POA: Diagnosis not present

## 2016-01-25 DIAGNOSIS — Z1389 Encounter for screening for other disorder: Secondary | ICD-10-CM | POA: Diagnosis not present

## 2016-01-25 DIAGNOSIS — R201 Hypoesthesia of skin: Secondary | ICD-10-CM | POA: Diagnosis not present

## 2016-01-25 DIAGNOSIS — E1140 Type 2 diabetes mellitus with diabetic neuropathy, unspecified: Secondary | ICD-10-CM | POA: Diagnosis not present

## 2016-01-25 DIAGNOSIS — G894 Chronic pain syndrome: Secondary | ICD-10-CM | POA: Diagnosis not present

## 2016-04-28 DIAGNOSIS — Z1389 Encounter for screening for other disorder: Secondary | ICD-10-CM | POA: Diagnosis not present

## 2016-04-28 DIAGNOSIS — G894 Chronic pain syndrome: Secondary | ICD-10-CM | POA: Diagnosis not present

## 2016-04-28 DIAGNOSIS — E1165 Type 2 diabetes mellitus with hyperglycemia: Secondary | ICD-10-CM | POA: Diagnosis not present

## 2016-04-28 DIAGNOSIS — B353 Tinea pedis: Secondary | ICD-10-CM | POA: Diagnosis not present

## 2016-07-25 DIAGNOSIS — Z1389 Encounter for screening for other disorder: Secondary | ICD-10-CM | POA: Diagnosis not present

## 2016-07-25 DIAGNOSIS — E119 Type 2 diabetes mellitus without complications: Secondary | ICD-10-CM | POA: Diagnosis not present

## 2016-07-25 DIAGNOSIS — I10 Essential (primary) hypertension: Secondary | ICD-10-CM | POA: Diagnosis not present

## 2016-07-29 DIAGNOSIS — Z1389 Encounter for screening for other disorder: Secondary | ICD-10-CM | POA: Diagnosis not present

## 2016-07-29 DIAGNOSIS — E119 Type 2 diabetes mellitus without complications: Secondary | ICD-10-CM | POA: Diagnosis not present

## 2016-10-23 DIAGNOSIS — J40 Bronchitis, not specified as acute or chronic: Secondary | ICD-10-CM | POA: Diagnosis not present

## 2016-10-23 DIAGNOSIS — G894 Chronic pain syndrome: Secondary | ICD-10-CM | POA: Diagnosis not present

## 2016-10-23 DIAGNOSIS — Z1389 Encounter for screening for other disorder: Secondary | ICD-10-CM | POA: Diagnosis not present

## 2017-01-16 DIAGNOSIS — I10 Essential (primary) hypertension: Secondary | ICD-10-CM | POA: Diagnosis not present

## 2017-01-16 DIAGNOSIS — G894 Chronic pain syndrome: Secondary | ICD-10-CM | POA: Diagnosis not present

## 2017-01-16 DIAGNOSIS — Z1389 Encounter for screening for other disorder: Secondary | ICD-10-CM | POA: Diagnosis not present

## 2017-01-16 DIAGNOSIS — E1129 Type 2 diabetes mellitus with other diabetic kidney complication: Secondary | ICD-10-CM | POA: Diagnosis not present

## 2017-04-10 DIAGNOSIS — G894 Chronic pain syndrome: Secondary | ICD-10-CM | POA: Diagnosis not present

## 2017-04-10 DIAGNOSIS — I10 Essential (primary) hypertension: Secondary | ICD-10-CM | POA: Diagnosis not present

## 2017-04-10 DIAGNOSIS — Z1389 Encounter for screening for other disorder: Secondary | ICD-10-CM | POA: Diagnosis not present

## 2017-04-16 DIAGNOSIS — H2510 Age-related nuclear cataract, unspecified eye: Secondary | ICD-10-CM | POA: Diagnosis not present

## 2017-04-16 DIAGNOSIS — H52222 Regular astigmatism, left eye: Secondary | ICD-10-CM | POA: Diagnosis not present

## 2017-04-16 DIAGNOSIS — E119 Type 2 diabetes mellitus without complications: Secondary | ICD-10-CM | POA: Diagnosis not present

## 2017-04-16 DIAGNOSIS — H5203 Hypermetropia, bilateral: Secondary | ICD-10-CM | POA: Diagnosis not present

## 2017-04-16 DIAGNOSIS — E1165 Type 2 diabetes mellitus with hyperglycemia: Secondary | ICD-10-CM | POA: Diagnosis not present

## 2017-04-16 DIAGNOSIS — Z1211 Encounter for screening for malignant neoplasm of colon: Secondary | ICD-10-CM | POA: Diagnosis not present

## 2017-04-22 ENCOUNTER — Other Ambulatory Visit (HOSPITAL_COMMUNITY): Payer: Self-pay | Admitting: Internal Medicine

## 2017-04-22 DIAGNOSIS — Z1231 Encounter for screening mammogram for malignant neoplasm of breast: Secondary | ICD-10-CM

## 2017-04-24 ENCOUNTER — Ambulatory Visit (HOSPITAL_COMMUNITY)
Admission: RE | Admit: 2017-04-24 | Discharge: 2017-04-24 | Disposition: A | Discharge status: Home / Self Care | Payer: Medicare Other | Source: Ambulatory Visit | Attending: Internal Medicine | Admitting: Internal Medicine

## 2017-04-24 DIAGNOSIS — Z1231 Encounter for screening mammogram for malignant neoplasm of breast: Secondary | ICD-10-CM | POA: Diagnosis not present

## 2017-07-13 DIAGNOSIS — E1140 Type 2 diabetes mellitus with diabetic neuropathy, unspecified: Secondary | ICD-10-CM | POA: Diagnosis not present

## 2017-07-13 DIAGNOSIS — R600 Localized edema: Secondary | ICD-10-CM | POA: Diagnosis not present

## 2017-07-13 DIAGNOSIS — B353 Tinea pedis: Secondary | ICD-10-CM | POA: Diagnosis not present

## 2017-07-13 DIAGNOSIS — I10 Essential (primary) hypertension: Secondary | ICD-10-CM | POA: Diagnosis not present

## 2017-07-13 DIAGNOSIS — B351 Tinea unguium: Secondary | ICD-10-CM | POA: Diagnosis not present

## 2017-09-24 DIAGNOSIS — Z1389 Encounter for screening for other disorder: Secondary | ICD-10-CM | POA: Diagnosis not present

## 2017-09-24 DIAGNOSIS — G629 Polyneuropathy, unspecified: Secondary | ICD-10-CM | POA: Diagnosis not present

## 2017-09-24 DIAGNOSIS — Z0000 Encounter for general adult medical examination without abnormal findings: Secondary | ICD-10-CM | POA: Diagnosis not present

## 2017-10-08 DIAGNOSIS — M1991 Primary osteoarthritis, unspecified site: Secondary | ICD-10-CM | POA: Diagnosis not present

## 2017-10-08 DIAGNOSIS — I10 Essential (primary) hypertension: Secondary | ICD-10-CM | POA: Diagnosis not present

## 2017-10-08 DIAGNOSIS — E119 Type 2 diabetes mellitus without complications: Secondary | ICD-10-CM | POA: Diagnosis not present

## 2018-01-04 DIAGNOSIS — G894 Chronic pain syndrome: Secondary | ICD-10-CM | POA: Diagnosis not present

## 2018-01-04 DIAGNOSIS — K219 Gastro-esophageal reflux disease without esophagitis: Secondary | ICD-10-CM | POA: Diagnosis not present

## 2018-01-04 DIAGNOSIS — R946 Abnormal results of thyroid function studies: Secondary | ICD-10-CM | POA: Diagnosis not present

## 2018-01-04 DIAGNOSIS — E785 Hyperlipidemia, unspecified: Secondary | ICD-10-CM | POA: Diagnosis not present

## 2018-01-04 DIAGNOSIS — I10 Essential (primary) hypertension: Secondary | ICD-10-CM | POA: Diagnosis not present

## 2018-01-04 DIAGNOSIS — E782 Mixed hyperlipidemia: Secondary | ICD-10-CM | POA: Diagnosis not present

## 2018-01-04 DIAGNOSIS — Z1389 Encounter for screening for other disorder: Secondary | ICD-10-CM | POA: Diagnosis not present

## 2018-03-31 DIAGNOSIS — K219 Gastro-esophageal reflux disease without esophagitis: Secondary | ICD-10-CM | POA: Diagnosis not present

## 2018-03-31 DIAGNOSIS — I10 Essential (primary) hypertension: Secondary | ICD-10-CM | POA: Diagnosis not present

## 2018-03-31 DIAGNOSIS — E1165 Type 2 diabetes mellitus with hyperglycemia: Secondary | ICD-10-CM | POA: Diagnosis not present

## 2018-03-31 DIAGNOSIS — M1991 Primary osteoarthritis, unspecified site: Secondary | ICD-10-CM | POA: Diagnosis not present

## 2018-03-31 DIAGNOSIS — D699 Hemorrhagic condition, unspecified: Secondary | ICD-10-CM | POA: Diagnosis not present

## 2018-03-31 DIAGNOSIS — Z1389 Encounter for screening for other disorder: Secondary | ICD-10-CM | POA: Diagnosis not present

## 2018-05-03 ENCOUNTER — Other Ambulatory Visit: Payer: Self-pay

## 2018-05-03 NOTE — Patient Outreach (Signed)
[place] [facility]) Care Management  05/03/2018  Susan Odom Feb 18, 1946 [id number]   Medication Adherence call to [name] patient's telephone number is disconnected. [name] is showing past due under [place]. On Losartan 50 mg.   [place] Management Direct Dial [phone]  Fax [phone] [email or web address]

## 2018-06-25 DIAGNOSIS — G894 Chronic pain syndrome: Secondary | ICD-10-CM | POA: Diagnosis not present

## 2018-06-25 DIAGNOSIS — E1142 Type 2 diabetes mellitus with diabetic polyneuropathy: Secondary | ICD-10-CM | POA: Diagnosis not present

## 2018-06-25 DIAGNOSIS — I10 Essential (primary) hypertension: Secondary | ICD-10-CM | POA: Diagnosis not present

## 2018-06-30 ENCOUNTER — Other Ambulatory Visit (HOSPITAL_COMMUNITY): Payer: Self-pay | Admitting: Internal Medicine

## 2018-06-30 DIAGNOSIS — Z1231 Encounter for screening mammogram for malignant neoplasm of breast: Secondary | ICD-10-CM

## 2018-07-08 ENCOUNTER — Ambulatory Visit (HOSPITAL_COMMUNITY): Payer: Medicare Other

## 2018-07-15 ENCOUNTER — Ambulatory Visit (HOSPITAL_COMMUNITY): Payer: Medicare Other

## 2018-08-09 DIAGNOSIS — E1129 Type 2 diabetes mellitus with other diabetic kidney complication: Secondary | ICD-10-CM | POA: Diagnosis not present

## 2018-08-09 DIAGNOSIS — Z1389 Encounter for screening for other disorder: Secondary | ICD-10-CM | POA: Diagnosis not present

## 2018-08-09 DIAGNOSIS — Z0001 Encounter for general adult medical examination with abnormal findings: Secondary | ICD-10-CM | POA: Diagnosis not present

## 2018-09-27 DIAGNOSIS — I10 Essential (primary) hypertension: Secondary | ICD-10-CM | POA: Diagnosis not present

## 2018-09-27 DIAGNOSIS — G894 Chronic pain syndrome: Secondary | ICD-10-CM | POA: Diagnosis not present

## 2018-09-27 DIAGNOSIS — R2681 Unsteadiness on feet: Secondary | ICD-10-CM | POA: Diagnosis not present

## 2018-09-27 DIAGNOSIS — E538 Deficiency of other specified B group vitamins: Secondary | ICD-10-CM | POA: Diagnosis not present

## 2018-09-27 DIAGNOSIS — D510 Vitamin B12 deficiency anemia due to intrinsic factor deficiency: Secondary | ICD-10-CM | POA: Diagnosis not present

## 2018-09-27 DIAGNOSIS — M1991 Primary osteoarthritis, unspecified site: Secondary | ICD-10-CM | POA: Diagnosis not present

## 2018-09-29 ENCOUNTER — Ambulatory Visit (HOSPITAL_COMMUNITY): Payer: Medicare Other

## 2018-10-04 ENCOUNTER — Ambulatory Visit (HOSPITAL_COMMUNITY)
Admission: RE | Admit: 2018-10-04 | Discharge: 2018-10-04 | Disposition: A | Discharge status: Home / Self Care | Payer: Medicare Other | Source: Ambulatory Visit | Attending: Internal Medicine | Admitting: Internal Medicine

## 2018-10-04 DIAGNOSIS — Z1231 Encounter for screening mammogram for malignant neoplasm of breast: Secondary | ICD-10-CM | POA: Insufficient documentation

## 2018-11-19 DIAGNOSIS — Z1211 Encounter for screening for malignant neoplasm of colon: Secondary | ICD-10-CM | POA: Diagnosis not present

## 2018-12-31 ENCOUNTER — Other Ambulatory Visit (HOSPITAL_COMMUNITY): Payer: Self-pay | Admitting: Internal Medicine

## 2018-12-31 ENCOUNTER — Ambulatory Visit (HOSPITAL_COMMUNITY)
Admission: RE | Admit: 2018-12-31 | Discharge: 2018-12-31 | Disposition: A | Discharge status: Home / Self Care | Payer: Medicare Other | Source: Ambulatory Visit | Attending: Internal Medicine | Admitting: Internal Medicine

## 2018-12-31 DIAGNOSIS — M79661 Pain in right lower leg: Secondary | ICD-10-CM | POA: Diagnosis not present

## 2018-12-31 DIAGNOSIS — S99911A Unspecified injury of right ankle, initial encounter: Secondary | ICD-10-CM | POA: Insufficient documentation

## 2018-12-31 DIAGNOSIS — E119 Type 2 diabetes mellitus without complications: Secondary | ICD-10-CM | POA: Diagnosis not present

## 2018-12-31 DIAGNOSIS — I10 Essential (primary) hypertension: Secondary | ICD-10-CM | POA: Diagnosis not present

## 2018-12-31 DIAGNOSIS — S8991XA Unspecified injury of right lower leg, initial encounter: Secondary | ICD-10-CM | POA: Diagnosis not present

## 2018-12-31 DIAGNOSIS — M79604 Pain in right leg: Secondary | ICD-10-CM

## 2018-12-31 DIAGNOSIS — L84 Corns and callosities: Secondary | ICD-10-CM | POA: Diagnosis not present

## 2018-12-31 DIAGNOSIS — Z1389 Encounter for screening for other disorder: Secondary | ICD-10-CM | POA: Diagnosis not present

## 2018-12-31 DIAGNOSIS — M7989 Other specified soft tissue disorders: Secondary | ICD-10-CM | POA: Diagnosis not present

## 2019-01-21 ENCOUNTER — Other Ambulatory Visit (HOSPITAL_COMMUNITY): Payer: Self-pay | Admitting: Family Medicine

## 2019-01-21 ENCOUNTER — Ambulatory Visit (HOSPITAL_COMMUNITY)
Admission: RE | Admit: 2019-01-21 | Discharge: 2019-01-21 | Disposition: A | Discharge status: Home / Self Care | Payer: Medicare Other | Source: Ambulatory Visit | Attending: Family Medicine | Admitting: Family Medicine

## 2019-01-21 DIAGNOSIS — M545 Low back pain: Secondary | ICD-10-CM | POA: Diagnosis not present

## 2019-01-21 DIAGNOSIS — M25561 Pain in right knee: Secondary | ICD-10-CM | POA: Insufficient documentation

## 2019-01-21 DIAGNOSIS — M1711 Unilateral primary osteoarthritis, right knee: Secondary | ICD-10-CM | POA: Diagnosis not present

## 2019-04-25 DIAGNOSIS — I10 Essential (primary) hypertension: Secondary | ICD-10-CM | POA: Diagnosis not present

## 2019-04-25 DIAGNOSIS — M2550 Pain in unspecified joint: Secondary | ICD-10-CM | POA: Diagnosis not present

## 2019-04-25 DIAGNOSIS — G894 Chronic pain syndrome: Secondary | ICD-10-CM | POA: Diagnosis not present

## 2019-04-25 DIAGNOSIS — N182 Chronic kidney disease, stage 2 (mild): Secondary | ICD-10-CM | POA: Diagnosis not present

## 2019-04-25 DIAGNOSIS — D510 Vitamin B12 deficiency anemia due to intrinsic factor deficiency: Secondary | ICD-10-CM | POA: Diagnosis not present

## 2019-04-25 DIAGNOSIS — Z1389 Encounter for screening for other disorder: Secondary | ICD-10-CM | POA: Diagnosis not present

## 2019-04-25 DIAGNOSIS — M1991 Primary osteoarthritis, unspecified site: Secondary | ICD-10-CM | POA: Diagnosis not present

## 2019-04-25 DIAGNOSIS — E1129 Type 2 diabetes mellitus with other diabetic kidney complication: Secondary | ICD-10-CM | POA: Diagnosis not present

## 2019-04-25 DIAGNOSIS — E119 Type 2 diabetes mellitus without complications: Secondary | ICD-10-CM | POA: Diagnosis not present

## 2019-04-25 DIAGNOSIS — E538 Deficiency of other specified B group vitamins: Secondary | ICD-10-CM | POA: Diagnosis not present

## 2019-04-25 DIAGNOSIS — Z0001 Encounter for general adult medical examination with abnormal findings: Secondary | ICD-10-CM | POA: Diagnosis not present

## 2019-07-12 ENCOUNTER — Other Ambulatory Visit: Payer: Self-pay

## 2019-07-12 NOTE — Patient Outreach (Signed)
[facility] [facility]) Care Management  07/12/2019  Susan Odom 29-Jun-1946 [id number]   Medication Adherence call to Susan Odom [place] Identifiers Verify spoke with patient she is past due on Losartan 50 mg patient explain she takes 1 tablet daily patient pick up this medication a couple of days ago and has plenty for 90 days. Susan Odom is showing past due under [place].   [place] Management Direct Dial [phone]  Fax [phone] [email or web address]

## 2019-07-28 DIAGNOSIS — I10 Essential (primary) hypertension: Secondary | ICD-10-CM | POA: Diagnosis not present

## 2019-07-28 DIAGNOSIS — E1140 Type 2 diabetes mellitus with diabetic neuropathy, unspecified: Secondary | ICD-10-CM | POA: Diagnosis not present

## 2019-07-28 DIAGNOSIS — Z23 Encounter for immunization: Secondary | ICD-10-CM | POA: Diagnosis not present

## 2019-09-19 ENCOUNTER — Other Ambulatory Visit: Payer: Self-pay

## 2019-09-19 NOTE — Patient Outreach (Signed)
[place] [facility]) Care Management  09/19/2019  Susan Odom 1945-12-05 [id number]   Medication Adherence call to Susan Odom Telephone call to Patient regarding Medication Adherence unable to reach patient. Susan Odom is showing past due on Glipizide Er 5 mg under [place].   [place] Management Direct Dial [phone]  Fax [phone] [email or web address]

## 2019-09-29 ENCOUNTER — Other Ambulatory Visit: Payer: Self-pay

## 2019-09-29 NOTE — Patient Outreach (Signed)
[facility]) Care Management  09/29/2019  Susan Odom 04-24-1946 [id number]   Medication Adherence call to [name] patient did not answer and voice mail not set up,patient is past due on Atorvastatin 40 mg under [place].   [place] Management Direct Dial [phone]  Fax [phone] [email or web address]

## 2019-11-01 DIAGNOSIS — K219 Gastro-esophageal reflux disease without esophagitis: Secondary | ICD-10-CM | POA: Diagnosis not present

## 2019-11-01 DIAGNOSIS — G894 Chronic pain syndrome: Secondary | ICD-10-CM | POA: Diagnosis not present

## 2019-11-01 DIAGNOSIS — E1140 Type 2 diabetes mellitus with diabetic neuropathy, unspecified: Secondary | ICD-10-CM | POA: Diagnosis not present

## 2019-11-01 DIAGNOSIS — I10 Essential (primary) hypertension: Secondary | ICD-10-CM | POA: Diagnosis not present

## 2019-11-01 DIAGNOSIS — M1991 Primary osteoarthritis, unspecified site: Secondary | ICD-10-CM | POA: Diagnosis not present

## 2019-11-01 DIAGNOSIS — B351 Tinea unguium: Secondary | ICD-10-CM | POA: Diagnosis not present

## 2019-11-01 DIAGNOSIS — R201 Hypoesthesia of skin: Secondary | ICD-10-CM | POA: Diagnosis not present

## 2019-11-02 ENCOUNTER — Other Ambulatory Visit (HOSPITAL_COMMUNITY): Payer: Self-pay | Admitting: Internal Medicine

## 2019-11-02 DIAGNOSIS — Z1231 Encounter for screening mammogram for malignant neoplasm of breast: Secondary | ICD-10-CM

## 2019-11-18 ENCOUNTER — Emergency Department (HOSPITAL_COMMUNITY)
Admission: EM | Admit: 2019-11-18 | Discharge: 2019-11-19 | Disposition: A | Discharge status: Home / Self Care | Payer: Medicare Other | Attending: Emergency Medicine | Admitting: Emergency Medicine

## 2019-11-18 ENCOUNTER — Other Ambulatory Visit: Payer: Self-pay

## 2019-11-18 ENCOUNTER — Encounter (HOSPITAL_COMMUNITY): Payer: Self-pay

## 2019-11-18 DIAGNOSIS — Y939 Activity, unspecified: Secondary | ICD-10-CM | POA: Diagnosis not present

## 2019-11-18 DIAGNOSIS — S3993XA Unspecified injury of pelvis, initial encounter: Secondary | ICD-10-CM | POA: Diagnosis not present

## 2019-11-18 DIAGNOSIS — W19XXXA Unspecified fall, initial encounter: Secondary | ICD-10-CM

## 2019-11-18 DIAGNOSIS — W010XXA Fall on same level from slipping, tripping and stumbling without subsequent striking against object, initial encounter: Secondary | ICD-10-CM | POA: Diagnosis not present

## 2019-11-18 DIAGNOSIS — I10 Essential (primary) hypertension: Secondary | ICD-10-CM | POA: Insufficient documentation

## 2019-11-18 DIAGNOSIS — R110 Nausea: Secondary | ICD-10-CM | POA: Diagnosis not present

## 2019-11-18 DIAGNOSIS — Y929 Unspecified place or not applicable: Secondary | ICD-10-CM | POA: Diagnosis not present

## 2019-11-18 DIAGNOSIS — Z7984 Long term (current) use of oral hypoglycemic drugs: Secondary | ICD-10-CM | POA: Insufficient documentation

## 2019-11-18 DIAGNOSIS — Z743 Need for continuous supervision: Secondary | ICD-10-CM | POA: Diagnosis not present

## 2019-11-18 DIAGNOSIS — E119 Type 2 diabetes mellitus without complications: Secondary | ICD-10-CM | POA: Diagnosis not present

## 2019-11-18 DIAGNOSIS — Y999 Unspecified external cause status: Secondary | ICD-10-CM | POA: Insufficient documentation

## 2019-11-18 DIAGNOSIS — Z79899 Other long term (current) drug therapy: Secondary | ICD-10-CM | POA: Diagnosis not present

## 2019-11-18 MED ORDER — ONDANSETRON HCL 4 MG/2ML IJ SOLN
4.0000 mg | Freq: Once | INTRAMUSCULAR | Status: AC
  Administered 2019-11-19: 4 mg via INTRAVENOUS
  Filled 2019-11-18: qty 2

## 2019-11-18 NOTE — ED Provider Notes (Signed)
[facility] EMERGENCY DEPARTMENT Provider Note   CSN: [id number] Arrival date & time: 11/18/19  2308     History Chief Complaint  Patient presents with  . Fall    Susan Odom is a 73 y.o. female.  HPI    Patient presents after mechanical fall with concern of nausea. Patient recalls falling to its entirety.  She states that she was walking when her feet got tangled up. She denies substantial head trauma, and since the fall has had no head pain, neck pain, new weakness in any extremity.  Not she has had nausea, which is puzzling to her. Patient has baseline neuropathy, this is unchanged.  She denies other focal changes well. She was in her usual state of health prior to fall, having a pleasant Christmas evening. Past Medical History:  Diagnosis Date  . Diabetes mellitus   . Hypertension     There are no problems to display for this patient.   History reviewed. No pertinent surgical history.   OB History   No obstetric history on file.     No family history on file.  Social History   Tobacco Use  . Smoking status: Never Smoker  . Smokeless tobacco: Never Used  Substance Use Topics  . Alcohol use: No  . Drug use: No    Home Medications Prior to Admission medications   Medication Sig Start Date End Date Taking? Authorizing Provider  acetaminophen (TYLENOL) 500 MG tablet Take 1,000 mg by mouth every 6 (six) hours as needed for pain.    [provider]  ALPRAZolam Duanne Moron) 1 MG tablet Take 1 mg by mouth 4 (four) times daily.    [provider]  dexamethasone (DECADRON) 1 MG tablet 2 tablets twice daily for 2 days, one tablet twice daily for 2 days, one tablet daily for 2 days. 08/29/13   Kristeen Miss, MD  HYDROcodone-acetaminophen (NORCO/VICODIN) 5-325 MG per tablet Take 1 tablet by mouth every 6 (six) hours as needed (Severe pain). 08/29/13   Kristeen Miss, MD  losartan (COZAAR) 100 MG tablet Take 100 mg by mouth daily.    [provider]  metFORMIN (GLUCOPHAGE) 500 MG tablet Take 1,000 mg by mouth 2 (two) times daily.    [provider]    Allergies    Patient has no known allergies.  Review of Systems   Review of Systems  Constitutional:       Per HPI, otherwise negative  HENT:       Per HPI, otherwise negative  Respiratory:       Per HPI, otherwise negative  Cardiovascular:       Per HPI, otherwise negative  Gastrointestinal: Positive for nausea. Negative for vomiting.  Endocrine:       Negative aside from HPI  Genitourinary:       Neg aside from HPI   Musculoskeletal:       Per HPI, otherwise negative  Skin: Negative.   Neurological: Positive for numbness. Negative for syncope.    Physical Exam Updated Vital Signs BP (!) 164/76   Pulse 86   Temp 98.1 F (36.7 C) (Oral)   Resp 18   Ht 5\' 10"  (1.778 m)   Wt 99.8 kg   SpO2 96%   BMI 31.57 kg/m   Physical Exam Vitals and nursing note reviewed.  Constitutional:      General: She is not in acute distress.    Appearance: She is well-developed.  HENT:     Head:  Normocephalic and atraumatic.  Eyes:     Conjunctiva/sclera: Conjunctivae normal.  Cardiovascular:     Rate and Rhythm: Normal rate and regular rhythm.  Pulmonary:     Effort: Pulmonary effort is normal. No respiratory distress.     Breath sounds: Normal breath sounds. No stridor.  Abdominal:     General: There is no distension.  Musculoskeletal:     Comments: Pelvis stable, no gross deformities in any extremity, moves all extremities spontaneously.  Skin:    General: Skin is warm and dry.  Neurological:     Mental Status: She is alert and oriented to person, place, and time.     Cranial Nerves: No cranial nerve deficit.     Comments: Patient flexes each hip independently, spontaneously and against mild resistance, though strength is 4/5 bilaterally. She notes no changes in her sensation, with persistent distal numbness bilaterally.     ED Results /  Procedures / Treatments   Labs (all labs ordered are listed, but only abnormal results are displayed) Labs Reviewed - No data to display  EKG None  Radiology DG Pelvis Portable  Result Date: 11/19/2019 CLINICAL DATA:  Fall EXAM: PORTABLE PELVIS 1-2 VIEWS COMPARISON:  Hip radiograph 11/30/2001 (report only). FINDINGS: The osseous structures appear diffusely demineralized which may limit detection of small or nondisplaced fractures. No definite acute fracture of the pelvis or proximal femora is seen. The femoral heads are normally located. No abnormal widening of the SI joints or symphysis pubis. Portions of the sacrum are largely obscured by overlying bowel gas and fecal material. Mild to moderate degenerative changes present lower spine, pelvis and hips. IMPRESSION: 1. No definite acute fracture or dislocation of the pelvis or proximal femora. 2. Diffuse osseous demineralization which may limit detection of small or nondisplaced fractures. 3. Degenerative changes in the lower spine pelvis and hips. Electronically Signed   By: Lovena Le M.D.   On: 11/19/2019 00:26    Procedures Procedures (including critical care time)  Medications Ordered in ED Medications  ondansetron (ZOFRAN) injection 4 mg (4 mg Intravenous Given 11/19/19 0043)    ED Course  I have reviewed the triage vital signs and the nursing notes.  Pertinent labs & imaging results that were available during my care of the patient were reviewed by me and considered in my medical decision making (see chart for details).    MDM Rules/Calculators/A&P                      1:43 AM Patient is awake, alert, states that she feels better. X-ray reviewed, unremarkable, no ongoing nausea or other complaints, no hemodynamic instability, no other evidence for acute findings from the fall, patient discharged in stable condition. Final Clinical Impression(s) / ED Diagnoses Final diagnoses:  Fall, initial encounter  Nausea    Rx /  DC Orders ED Discharge Orders    None       Carmin Muskrat, MD 11/19/19 [phone]

## 2019-11-18 NOTE — ED Triage Notes (Signed)
Pt states she fell at home approx 7 pm, states she may have gotten her feet tangled up and fell hitting her head on the floor.  Pt denies loc, but states she has been nauseated, no vomiting.

## 2019-11-19 ENCOUNTER — Emergency Department (HOSPITAL_COMMUNITY): Payer: Medicare Other

## 2019-11-19 DIAGNOSIS — S3993XA Unspecified injury of pelvis, initial encounter: Secondary | ICD-10-CM | POA: Diagnosis not present

## 2019-11-19 NOTE — Discharge Instructions (Addendum)
As discussed, your evaluation today has been largely reassuring.  But, it is important that you monitor your condition carefully, and do not hesitate to return to the ED if you develop new, or concerning changes in your condition. ? ?Otherwise, please follow-up with your physician for appropriate ongoing care. ? ?

## 2019-11-19 NOTE — ED Notes (Signed)
Pt given Ginger-Ale to drink 

## 2019-11-23 ENCOUNTER — Ambulatory Visit (HOSPITAL_COMMUNITY): Payer: Medicare Other

## 2019-11-24 DIAGNOSIS — I129 Hypertensive chronic kidney disease with stage 1 through stage 4 chronic kidney disease, or unspecified chronic kidney disease: Secondary | ICD-10-CM | POA: Diagnosis not present

## 2019-11-24 DIAGNOSIS — G894 Chronic pain syndrome: Secondary | ICD-10-CM | POA: Diagnosis not present

## 2019-11-24 DIAGNOSIS — N182 Chronic kidney disease, stage 2 (mild): Secondary | ICD-10-CM | POA: Diagnosis not present

## 2019-11-24 DIAGNOSIS — E1122 Type 2 diabetes mellitus with diabetic chronic kidney disease: Secondary | ICD-10-CM | POA: Diagnosis not present

## 2019-12-25 DIAGNOSIS — E1122 Type 2 diabetes mellitus with diabetic chronic kidney disease: Secondary | ICD-10-CM | POA: Diagnosis not present

## 2019-12-25 DIAGNOSIS — I129 Hypertensive chronic kidney disease with stage 1 through stage 4 chronic kidney disease, or unspecified chronic kidney disease: Secondary | ICD-10-CM | POA: Diagnosis not present

## 2019-12-25 DIAGNOSIS — N182 Chronic kidney disease, stage 2 (mild): Secondary | ICD-10-CM | POA: Diagnosis not present

## 2019-12-25 DIAGNOSIS — M1991 Primary osteoarthritis, unspecified site: Secondary | ICD-10-CM | POA: Diagnosis not present

## 2020-01-22 DIAGNOSIS — I129 Hypertensive chronic kidney disease with stage 1 through stage 4 chronic kidney disease, or unspecified chronic kidney disease: Secondary | ICD-10-CM | POA: Diagnosis not present

## 2020-01-22 DIAGNOSIS — E1122 Type 2 diabetes mellitus with diabetic chronic kidney disease: Secondary | ICD-10-CM | POA: Diagnosis not present

## 2020-01-22 DIAGNOSIS — N182 Chronic kidney disease, stage 2 (mild): Secondary | ICD-10-CM | POA: Diagnosis not present

## 2020-01-22 DIAGNOSIS — M1991 Primary osteoarthritis, unspecified site: Secondary | ICD-10-CM | POA: Diagnosis not present

## 2020-02-22 DIAGNOSIS — E1122 Type 2 diabetes mellitus with diabetic chronic kidney disease: Secondary | ICD-10-CM | POA: Diagnosis not present

## 2020-02-22 DIAGNOSIS — N182 Chronic kidney disease, stage 2 (mild): Secondary | ICD-10-CM | POA: Diagnosis not present

## 2020-02-22 DIAGNOSIS — I129 Hypertensive chronic kidney disease with stage 1 through stage 4 chronic kidney disease, or unspecified chronic kidney disease: Secondary | ICD-10-CM | POA: Diagnosis not present

## 2020-03-13 DIAGNOSIS — E559 Vitamin D deficiency, unspecified: Secondary | ICD-10-CM | POA: Diagnosis not present

## 2020-03-13 DIAGNOSIS — E039 Hypothyroidism, unspecified: Secondary | ICD-10-CM | POA: Diagnosis not present

## 2020-03-13 DIAGNOSIS — R2689 Other abnormalities of gait and mobility: Secondary | ICD-10-CM | POA: Diagnosis not present

## 2020-03-13 DIAGNOSIS — Z0001 Encounter for general adult medical examination with abnormal findings: Secondary | ICD-10-CM | POA: Diagnosis not present

## 2020-03-13 DIAGNOSIS — Z1389 Encounter for screening for other disorder: Secondary | ICD-10-CM | POA: Diagnosis not present

## 2020-03-13 DIAGNOSIS — G629 Polyneuropathy, unspecified: Secondary | ICD-10-CM | POA: Diagnosis not present

## 2020-03-13 DIAGNOSIS — G8220 Paraplegia, unspecified: Secondary | ICD-10-CM | POA: Diagnosis not present

## 2020-03-13 DIAGNOSIS — E1140 Type 2 diabetes mellitus with diabetic neuropathy, unspecified: Secondary | ICD-10-CM | POA: Diagnosis not present

## 2020-03-13 DIAGNOSIS — E538 Deficiency of other specified B group vitamins: Secondary | ICD-10-CM | POA: Diagnosis not present

## 2020-03-23 DIAGNOSIS — M1991 Primary osteoarthritis, unspecified site: Secondary | ICD-10-CM | POA: Diagnosis not present

## 2020-03-23 DIAGNOSIS — I129 Hypertensive chronic kidney disease with stage 1 through stage 4 chronic kidney disease, or unspecified chronic kidney disease: Secondary | ICD-10-CM | POA: Diagnosis not present

## 2020-03-23 DIAGNOSIS — N182 Chronic kidney disease, stage 2 (mild): Secondary | ICD-10-CM | POA: Diagnosis not present

## 2020-03-23 DIAGNOSIS — E1122 Type 2 diabetes mellitus with diabetic chronic kidney disease: Secondary | ICD-10-CM | POA: Diagnosis not present

## 2020-05-23 DIAGNOSIS — I129 Hypertensive chronic kidney disease with stage 1 through stage 4 chronic kidney disease, or unspecified chronic kidney disease: Secondary | ICD-10-CM | POA: Diagnosis not present

## 2020-05-23 DIAGNOSIS — M1991 Primary osteoarthritis, unspecified site: Secondary | ICD-10-CM | POA: Diagnosis not present

## 2020-05-23 DIAGNOSIS — E1122 Type 2 diabetes mellitus with diabetic chronic kidney disease: Secondary | ICD-10-CM | POA: Diagnosis not present

## 2020-05-23 DIAGNOSIS — N182 Chronic kidney disease, stage 2 (mild): Secondary | ICD-10-CM | POA: Diagnosis not present

## 2020-06-22 DIAGNOSIS — M1991 Primary osteoarthritis, unspecified site: Secondary | ICD-10-CM | POA: Diagnosis not present

## 2020-06-22 DIAGNOSIS — N182 Chronic kidney disease, stage 2 (mild): Secondary | ICD-10-CM | POA: Diagnosis not present

## 2020-06-22 DIAGNOSIS — E1122 Type 2 diabetes mellitus with diabetic chronic kidney disease: Secondary | ICD-10-CM | POA: Diagnosis not present

## 2020-06-22 DIAGNOSIS — I129 Hypertensive chronic kidney disease with stage 1 through stage 4 chronic kidney disease, or unspecified chronic kidney disease: Secondary | ICD-10-CM | POA: Diagnosis not present

## 2020-06-27 DIAGNOSIS — E1129 Type 2 diabetes mellitus with other diabetic kidney complication: Secondary | ICD-10-CM | POA: Diagnosis not present

## 2020-06-27 DIAGNOSIS — I10 Essential (primary) hypertension: Secondary | ICD-10-CM | POA: Diagnosis not present

## 2020-06-27 DIAGNOSIS — M1991 Primary osteoarthritis, unspecified site: Secondary | ICD-10-CM | POA: Diagnosis not present

## 2020-07-13 DIAGNOSIS — M79605 Pain in left leg: Secondary | ICD-10-CM | POA: Diagnosis not present

## 2020-07-13 DIAGNOSIS — R300 Dysuria: Secondary | ICD-10-CM | POA: Diagnosis not present

## 2020-07-13 DIAGNOSIS — I700 Atherosclerosis of aorta: Secondary | ICD-10-CM | POA: Diagnosis not present

## 2020-07-13 DIAGNOSIS — I701 Atherosclerosis of renal artery: Secondary | ICD-10-CM | POA: Diagnosis not present

## 2020-07-13 DIAGNOSIS — M4328 Fusion of spine, sacral and sacrococcygeal region: Secondary | ICD-10-CM | POA: Diagnosis not present

## 2020-07-13 DIAGNOSIS — I722 Aneurysm of renal artery: Secondary | ICD-10-CM | POA: Diagnosis not present

## 2020-07-13 DIAGNOSIS — M79604 Pain in right leg: Secondary | ICD-10-CM | POA: Diagnosis not present

## 2020-07-13 DIAGNOSIS — S3219XA Other fracture of sacrum, initial encounter for closed fracture: Secondary | ICD-10-CM | POA: Diagnosis not present

## 2020-07-13 DIAGNOSIS — M47817 Spondylosis without myelopathy or radiculopathy, lumbosacral region: Secondary | ICD-10-CM | POA: Diagnosis not present

## 2020-07-13 DIAGNOSIS — Z0389 Encounter for observation for other suspected diseases and conditions ruled out: Secondary | ICD-10-CM | POA: Diagnosis not present

## 2020-07-24 DIAGNOSIS — M1991 Primary osteoarthritis, unspecified site: Secondary | ICD-10-CM | POA: Diagnosis not present

## 2020-07-24 DIAGNOSIS — I129 Hypertensive chronic kidney disease with stage 1 through stage 4 chronic kidney disease, or unspecified chronic kidney disease: Secondary | ICD-10-CM | POA: Diagnosis not present

## 2020-07-24 DIAGNOSIS — E1122 Type 2 diabetes mellitus with diabetic chronic kidney disease: Secondary | ICD-10-CM | POA: Diagnosis not present

## 2020-07-24 DIAGNOSIS — N182 Chronic kidney disease, stage 2 (mild): Secondary | ICD-10-CM | POA: Diagnosis not present

## 2020-07-28 ENCOUNTER — Other Ambulatory Visit: Payer: Self-pay

## 2020-07-28 ENCOUNTER — Observation Stay (HOSPITAL_COMMUNITY)
Admission: EM | Admit: 2020-07-28 | Discharge: 2020-07-29 | Disposition: A | Discharge status: Home / Self Care | Payer: Medicare Other | Attending: Emergency Medicine | Admitting: Emergency Medicine

## 2020-07-28 ENCOUNTER — Emergency Department (HOSPITAL_COMMUNITY): Payer: Medicare Other

## 2020-07-28 ENCOUNTER — Encounter (HOSPITAL_COMMUNITY): Payer: Self-pay | Admitting: *Deleted

## 2020-07-28 DIAGNOSIS — R609 Edema, unspecified: Secondary | ICD-10-CM | POA: Diagnosis not present

## 2020-07-28 DIAGNOSIS — L89152 Pressure ulcer of sacral region, stage 2: Secondary | ICD-10-CM | POA: Diagnosis not present

## 2020-07-28 DIAGNOSIS — Z7984 Long term (current) use of oral hypoglycemic drugs: Secondary | ICD-10-CM | POA: Insufficient documentation

## 2020-07-28 DIAGNOSIS — W19XXXA Unspecified fall, initial encounter: Secondary | ICD-10-CM | POA: Diagnosis not present

## 2020-07-28 DIAGNOSIS — W1839XA Other fall on same level, initial encounter: Secondary | ICD-10-CM | POA: Diagnosis not present

## 2020-07-28 DIAGNOSIS — Y9389 Activity, other specified: Secondary | ICD-10-CM | POA: Diagnosis not present

## 2020-07-28 DIAGNOSIS — M25551 Pain in right hip: Secondary | ICD-10-CM | POA: Insufficient documentation

## 2020-07-28 DIAGNOSIS — Z20822 Contact with and (suspected) exposure to covid-19: Secondary | ICD-10-CM | POA: Diagnosis not present

## 2020-07-28 DIAGNOSIS — Y92009 Unspecified place in unspecified non-institutional (private) residence as the place of occurrence of the external cause: Secondary | ICD-10-CM | POA: Insufficient documentation

## 2020-07-28 DIAGNOSIS — L8992 Pressure ulcer of unspecified site, stage 2: Secondary | ICD-10-CM

## 2020-07-28 DIAGNOSIS — M79604 Pain in right leg: Secondary | ICD-10-CM | POA: Diagnosis not present

## 2020-07-28 DIAGNOSIS — L8990 Pressure ulcer of unspecified site, unspecified stage: Secondary | ICD-10-CM

## 2020-07-28 DIAGNOSIS — Z743 Need for continuous supervision: Secondary | ICD-10-CM | POA: Diagnosis not present

## 2020-07-28 DIAGNOSIS — E119 Type 2 diabetes mellitus without complications: Secondary | ICD-10-CM | POA: Insufficient documentation

## 2020-07-28 DIAGNOSIS — Z79899 Other long term (current) drug therapy: Secondary | ICD-10-CM | POA: Diagnosis not present

## 2020-07-28 DIAGNOSIS — M545 Low back pain: Secondary | ICD-10-CM | POA: Insufficient documentation

## 2020-07-28 DIAGNOSIS — M6282 Rhabdomyolysis: Secondary | ICD-10-CM | POA: Diagnosis not present

## 2020-07-28 DIAGNOSIS — S3991XA Unspecified injury of abdomen, initial encounter: Secondary | ICD-10-CM | POA: Diagnosis not present

## 2020-07-28 DIAGNOSIS — M5489 Other dorsalgia: Secondary | ICD-10-CM | POA: Diagnosis not present

## 2020-07-28 DIAGNOSIS — T796XXS Traumatic ischemia of muscle, sequela: Secondary | ICD-10-CM | POA: Diagnosis not present

## 2020-07-28 DIAGNOSIS — I10 Essential (primary) hypertension: Secondary | ICD-10-CM | POA: Insufficient documentation

## 2020-07-28 DIAGNOSIS — S299XXA Unspecified injury of thorax, initial encounter: Secondary | ICD-10-CM | POA: Diagnosis not present

## 2020-07-28 DIAGNOSIS — Y999 Unspecified external cause status: Secondary | ICD-10-CM | POA: Diagnosis not present

## 2020-07-28 DIAGNOSIS — S3993XA Unspecified injury of pelvis, initial encounter: Secondary | ICD-10-CM | POA: Diagnosis not present

## 2020-07-28 DIAGNOSIS — R531 Weakness: Secondary | ICD-10-CM

## 2020-07-28 DIAGNOSIS — S3992XA Unspecified injury of lower back, initial encounter: Secondary | ICD-10-CM | POA: Diagnosis not present

## 2020-07-28 DIAGNOSIS — S79819A Other specified injuries of unspecified hip, initial encounter: Secondary | ICD-10-CM

## 2020-07-28 DIAGNOSIS — S79911A Unspecified injury of right hip, initial encounter: Principal | ICD-10-CM | POA: Insufficient documentation

## 2020-07-28 LAB — COMPREHENSIVE METABOLIC PANEL
ALT: 21 U/L (ref 0–44)
AST: 42 U/L — ABNORMAL HIGH (ref 15–41)
Albumin: 4.2 g/dL (ref 3.5–5.0)
Alkaline Phosphatase: 66 U/L (ref 38–126)
Anion gap: 15 (ref 5–15)
BUN: 12 mg/dL (ref 8–23)
CO2: 22 mmol/L (ref 22–32)
Calcium: 9.9 mg/dL (ref 8.9–10.3)
Chloride: 106 mmol/L (ref 98–111)
Creatinine, Ser: 0.61 mg/dL (ref 0.44–1.00)
GFR calc Af Amer: >60 mL/min (ref >60)
GFR calc non Af Amer: >60 mL/min (ref >60)
Glucose, Bld: 202 mg/dL — ABNORMAL HIGH (ref 70–99)
Potassium: 3.9 mmol/L (ref 3.5–5.1)
Sodium: 143 mmol/L (ref 135–145)
Total Bilirubin: 2.2 mg/dL — ABNORMAL HIGH (ref 0.3–1.2)
Total Protein: 7.6 g/dL (ref 6.5–8.1)

## 2020-07-28 LAB — CBC WITH DIFFERENTIAL/PLATELET
Abs Immature Granulocytes: 0.04 10*3/uL (ref 0.00–0.07)
Basophils Absolute: 0 10*3/uL (ref 0.0–0.1)
Basophils Relative: 0 %
Eosinophils Absolute: 0.4 10*3/uL (ref 0.0–0.5)
Eosinophils Relative: 4 %
HCT: 50.3 % — ABNORMAL HIGH (ref 36.0–46.0)
Hemoglobin: 16.4 g/dL — ABNORMAL HIGH (ref 12.0–15.0)
Immature Granulocytes: 0 %
Lymphocytes Relative: 6 %
Lymphs Abs: 0.6 10*3/uL — ABNORMAL LOW (ref 0.7–4.0)
MCH: 30.5 pg (ref 26.0–34.0)
MCHC: 32.6 g/dL (ref 30.0–36.0)
MCV: 93.5 fL (ref 80.0–100.0)
Monocytes Absolute: 0.4 10*3/uL (ref 0.1–1.0)
Monocytes Relative: 5 %
Neutro Abs: 7.6 10*3/uL (ref 1.7–7.7)
Neutrophils Relative %: 85 %
Platelets: 167 10*3/uL (ref 150–400)
RBC: 5.38 MIL/uL — ABNORMAL HIGH (ref 3.87–5.11)
RDW: 13.8 % (ref 11.5–15.5)
WBC: 9.1 10*3/uL (ref 4.0–10.5)
nRBC: 0 % (ref 0.0–0.2)

## 2020-07-28 LAB — VITAMIN B12: Vitamin B-12: 186 pg/mL (ref 180–914)

## 2020-07-28 LAB — CK: Total CK: 2202 U/L — ABNORMAL HIGH (ref 38–234)

## 2020-07-28 LAB — CBG MONITORING, ED: Glucose-Capillary: 156 mg/dL — ABNORMAL HIGH (ref 70–99)

## 2020-07-28 LAB — SARS CORONAVIRUS 2 BY RT PCR (HOSPITAL ORDER, PERFORMED IN ~~LOC~~ HOSPITAL LAB): SARS Coronavirus 2: NEGATIVE

## 2020-07-28 MED ORDER — METHOCARBAMOL 1000 MG/10ML IJ SOLN
500.0000 mg | Freq: Four times a day (QID) | INTRAVENOUS | Status: DC | PRN
  Filled 2020-07-28: qty 5

## 2020-07-28 MED ORDER — HYDROCODONE-ACETAMINOPHEN 5-325 MG PO TABS: 1.0000 | ORAL_TABLET | Freq: Four times a day (QID) | ORAL | Status: DC | PRN

## 2020-07-28 MED ORDER — IOHEXOL 300 MG/ML  SOLN
100.0000 mL | Freq: Once | INTRAMUSCULAR | Status: AC | PRN
  Administered 2020-07-28: 100 mL via INTRAVENOUS

## 2020-07-28 MED ORDER — SODIUM CHLORIDE 0.9 % IV BOLUS
1000.0000 mL | Freq: Once | INTRAVENOUS | Status: AC
  Administered 2020-07-28: 1000 mL via INTRAVENOUS

## 2020-07-28 MED ORDER — AMLODIPINE BESYLATE 5 MG PO TABS
2.5000 mg | ORAL_TABLET | Freq: Every day | ORAL | Status: DC
  Administered 2020-07-28 – 2020-07-29 (×2): 2.5 mg via ORAL
  Filled 2020-07-28 (×2): qty 1

## 2020-07-28 MED ORDER — ALPRAZOLAM 0.5 MG PO TABS: 0.5000 mg | ORAL_TABLET | Freq: Two times a day (BID) | ORAL | Status: DC | PRN

## 2020-07-28 MED ORDER — HYDRALAZINE HCL 20 MG/ML IJ SOLN: 5.0000 mg | Freq: Four times a day (QID) | INTRAMUSCULAR | Status: DC | PRN

## 2020-07-28 MED ORDER — SODIUM CHLORIDE 0.9 % IV SOLN: INTRAVENOUS | Status: DC

## 2020-07-28 NOTE — ED Triage Notes (Signed)
Pt tripped over her feet, pt denies hitting her head.  Pt denies taking any blood thinners

## 2020-07-28 NOTE — ED Triage Notes (Signed)
Pt with c/o right lower back, right hip and right leg pain.

## 2020-07-28 NOTE — ED Provider Notes (Signed)
  Provider Note MRN:  [id number]  Arrival date & time: 07/28/20    ED Course and Medical Decision Making  Assumed care from Dr. Roderic Palau at shift change.  Frequent falls, elevated CK, hospitalist to evaluate for admission.  Admitted to hospital service for further care.  Procedures  Final Clinical Impressions(s) / ED Diagnoses     ICD-10-CM   1. Fall  W19.Merril Abbe DG Chest 1 View    DG Chest 1 View  2. Blunt trauma of hip  S79.819A CT NO CHARGE    CT NO CHARGE    ED Discharge Orders    None      Discharge Instructions   None     Barth Kirks. [place] Small, [place] [email or web address]    Maudie Flakes, MD 07/28/20 [phone]

## 2020-07-28 NOTE — ED Provider Notes (Signed)
[facility] EMERGENCY DEPARTMENT Provider Note   CSN: [id number] Arrival date & time: 07/28/20  1047     History Chief Complaint  Patient presents with  . Fall    Susan Odom is a 74 y.o. female.  Patient has been having multiple falls.  She fell yesterday and was unable to get up.  The history is provided by the patient. No language interpreter was used.  Fall This is a recurrent problem. The current episode started 12 to 24 hours ago. The problem occurs constantly. The problem has not changed since onset.Pertinent negatives include no chest pain, no abdominal pain and no headaches. Nothing aggravates the symptoms. Nothing relieves the symptoms. She has tried nothing for the symptoms.       Past Medical History:  Diagnosis Date  . Diabetes mellitus   . Hypertension     There are no problems to display for this patient.   History reviewed. No pertinent surgical history.   OB History   No obstetric history on file.     History reviewed. No pertinent family history.  Social History   Tobacco Use  . Smoking status: Never Smoker  . Smokeless tobacco: Never Used  Substance Use Topics  . Alcohol use: No  . Drug use: No    Home Medications Prior to Admission medications   Medication Sig Start Date End Date Taking? Authorizing Provider  acetaminophen (TYLENOL) 500 MG tablet Take 1,000 mg by mouth every 6 (six) hours as needed for pain.    [provider]  ALPRAZolam Duanne Moron) 1 MG tablet Take 1 mg by mouth 4 (four) times daily.    [provider]  dexamethasone (DECADRON) 1 MG tablet 2 tablets twice daily for 2 days, one tablet twice daily for 2 days, one tablet daily for 2 days. 08/29/13   Kristeen Miss, MD  HYDROcodone-acetaminophen (NORCO/VICODIN) 5-325 MG per tablet Take 1 tablet by mouth every 6 (six) hours as needed (Severe pain). 08/29/13   Kristeen Miss, MD  losartan (COZAAR) 100 MG tablet Take 100 mg by mouth daily.    [provider]  metFORMIN (GLUCOPHAGE) 500 MG tablet Take 1,000 mg by mouth 2 (two) times daily.    [provider]    Allergies    Patient has no known allergies.  Review of Systems   Review of Systems  Constitutional: Positive for fatigue. Negative for appetite change.  HENT: Negative for congestion, ear discharge and sinus pressure.   Eyes: Negative for discharge.  Respiratory: Negative for cough.   Cardiovascular: Negative for chest pain.  Gastrointestinal: Negative for abdominal pain and diarrhea.  Genitourinary: Negative for frequency and hematuria.  Musculoskeletal: Negative for back pain.  Skin: Negative for rash.  Neurological: Negative for seizures and headaches.  Psychiatric/Behavioral: Negative for hallucinations.    Physical Exam Updated Vital Signs BP (!) 154/87   Pulse 88   Temp 98.1 F (36.7 C) (Oral)   Resp 13   Ht 5\' 10"  (1.778 m)   Wt 102.1 kg   SpO2 97%   BMI 32.28 kg/m   Physical Exam Vitals reviewed.  Constitutional:      Appearance: She is well-developed.  HENT:     Head: Normocephalic.     Nose: Nose normal.  Eyes:     General: No scleral icterus.    Conjunctiva/sclera: Conjunctivae normal.  Neck:     Thyroid: No thyromegaly.  Cardiovascular:     Rate and Rhythm: Normal rate and regular rhythm.  Heart sounds: No murmur heard.  No friction rub. No gallop.   Pulmonary:     Breath sounds: No stridor. No wheezing or rales.  Chest:     Chest wall: No tenderness.  Abdominal:     General: There is no distension.     Tenderness: There is no abdominal tenderness. There is no rebound.  Musculoskeletal:     Cervical back: Neck supple.     Comments: Patient too weak to get up  Lymphadenopathy:     Cervical: No cervical adenopathy.  Skin:    Findings: No erythema or rash.  Neurological:     Mental Status: She is alert and oriented to person, place, and time.     Motor: No abnormal muscle tone.     Coordination: Coordination  normal.  Psychiatric:        Behavior: Behavior normal.     ED Results / Procedures / Treatments   Labs (all labs ordered are listed, but only abnormal results are displayed) Labs Reviewed  CBC WITH DIFFERENTIAL/PLATELET - Abnormal; Notable for the following components:      Result Value   RBC 5.38 (*)    Hemoglobin 16.4 (*)    HCT 50.3 (*)    Lymphs Abs 0.6 (*)    All other components within normal limits  COMPREHENSIVE METABOLIC PANEL - Abnormal; Notable for the following components:   Glucose, Bld 202 (*)    AST 42 (*)    Total Bilirubin 2.2 (*)    All other components within normal limits  CK - Abnormal; Notable for the following components:   Total CK 2,202 (*)    All other components within normal limits  SARS CORONAVIRUS 2 BY RT PCR (HOSPITAL ORDER, [facility])  URINALYSIS, ROUTINE W REFLEX MICROSCOPIC    EKG None  Radiology DG Chest 1 View  Result Date: 07/28/2020 CLINICAL DATA:  Fall last night. EXAM: CHEST  1 VIEW COMPARISON:  August 22, 2013. FINDINGS: Stable cardiomediastinal silhouette. Mild bibasilar subsegmental atelectasis is noted. No pneumothorax or pleural effusion is noted. Bony thorax is unremarkable. IMPRESSION: Mild bibasilar subsegmental atelectasis. Electronically Signed   By: Marijo Conception M.D.   On: 07/28/2020 12:47   DG Lumbar Spine Complete  Result Date: 07/28/2020 CLINICAL DATA:  Lower back pain after fall last night. EXAM: LUMBAR SPINE - COMPLETE 4+ VIEW COMPARISON:  None. FINDINGS: No fracture or significant spondylolisthesis is noted. Moderate to severe degenerative disc disease is noted at all levels of the lumbar spine. 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 No acute abnormality seen in the lumbar spine. Electronically Signed   By: Marijo Conception M.D.   On: 07/28/2020 12:49   CT ABDOMEN PELVIS W CONTRAST  Result Date: 07/28/2020 CLINICAL DATA:  Low back and right hip pain since  falling from bed last night. EXAM: CT ABDOMEN AND PELVIS WITH CONTRAST TECHNIQUE: Multidetector CT imaging of the abdomen and pelvis was performed using the standard protocol following bolus administration of intravenous contrast. CONTRAST:  149mL OMNIPAQUE IOHEXOL 300 MG/ML  SOLN COMPARISON:  Lumbar spine and right hip radiographs same date. CT lumbar spine 07/13/2020. FINDINGS: Lower chest: Atherosclerosis of the aorta and coronary arteries. Possible calcifications of the aortic valve. The heart size is normal. There is no significant pleural or pericardial effusion. Mild atelectasis is present at both lung bases. Hepatobiliary: The liver is normal in density without suspicious focal abnormality. No evidence of gallstones, gallbladder wall thickening or  biliary dilatation. Pancreas: Unremarkable. No pancreatic ductal dilatation or surrounding inflammatory changes. Spleen: Small central calcifications, likely granulomas. No splenomegaly or acute abnormality. Adrenals/Urinary Tract: Both adrenal glands appear normal. No evidence of acute kidney injury, urinary tract calculus or hydronephrosis. 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 Dependent high density in the right aspect of the bladder may reflect a small bladder calculus. No definite contrast excretion into the bladder. Stomach/Bowel: No evidence of bowel wall thickening, distention or surrounding inflammatory change. Vascular/Lymphatic: There are no enlarged abdominal or pelvic lymph nodes. No evidence of retroperitoneal hematoma. There is diffuse aortic and branch vessel atherosclerosis. As above, stable right renal artery aneurysm. Reproductive: Probable fiducial clips in the lower uterine segment. The uterus and ovaries otherwise appear normal. Other: Intact anterior abdominal wall. No ascites, hemoperitoneum or  pneumoperitoneum. Musculoskeletal: No acute or significant osseous findings. There is a stable chronic superior endplate compression deformity at L1. There is stable multilevel lumbar spondylosis. Stable sclerosis in the sacral ala bilaterally. IMPRESSION: 1. No acute findings or explanation for the patient's symptoms. 2. 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 3. Possible small bladder calculus. 4. Aortic Atherosclerosis (ICD10-I70.0). CT ADDITIONAL VIEWS (CT OF THE RIGHT HIP) Small field-of-view reformatted images through the right hip were obtained. There is no evidence of acute fracture or dislocation. There are mild right hip degenerative changes. There are stable degenerative changes at the right sacroiliac joint and bilateral sacral ala sclerosis. No significant soft tissue abnormalities in the proximal right thigh. Electronically Signed   By: Richardean Sale M.D.   On: 07/28/2020 14:20   CT NO CHARGE  Result Date: 07/28/2020 CLINICAL DATA:  Low back and right hip pain since falling from bed last night. EXAM: CT ABDOMEN AND PELVIS WITH CONTRAST TECHNIQUE: Multidetector CT imaging of the abdomen and pelvis was performed using the standard protocol following bolus administration of intravenous contrast. CONTRAST:  135mL OMNIPAQUE IOHEXOL 300 MG/ML  SOLN COMPARISON:  Lumbar spine and right hip radiographs same date. CT lumbar spine 07/13/2020. FINDINGS: Lower chest: Atherosclerosis of the aorta and coronary arteries. Possible calcifications of the aortic valve. The heart size is normal. There is no significant pleural or pericardial effusion. Mild atelectasis is present at both lung bases. Hepatobiliary: The liver is normal in density without suspicious focal abnormality. No evidence of gallstones, gallbladder wall thickening or biliary dilatation. Pancreas: Unremarkable. No pancreatic ductal dilatation or surrounding inflammatory changes. Spleen: Small central calcifications, likely  granulomas. No splenomegaly or acute abnormality. Adrenals/Urinary Tract: Both adrenal glands appear normal. No evidence of acute kidney injury, urinary tract calculus or hydronephrosis. 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 Dependent high density in the right aspect of the bladder may reflect a small bladder calculus. No definite contrast excretion into the bladder. Stomach/Bowel: No evidence of bowel wall thickening, distention or surrounding inflammatory change. Vascular/Lymphatic: There are no enlarged abdominal or pelvic lymph nodes. No evidence of retroperitoneal hematoma. There is diffuse aortic and branch vessel atherosclerosis. As above, stable right renal artery aneurysm. Reproductive: Probable fiducial clips in the lower uterine segment. The uterus and ovaries otherwise appear normal. Other: Intact anterior abdominal wall. No ascites, hemoperitoneum or pneumoperitoneum. Musculoskeletal: No acute or significant osseous findings. There is a stable chronic superior endplate compression deformity at  L1. There is stable multilevel lumbar spondylosis. Stable sclerosis in the sacral ala bilaterally. IMPRESSION: 1. No acute findings or explanation for the patient's symptoms. 2. 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 3. Possible small bladder calculus. 4. Aortic Atherosclerosis (ICD10-I70.0). CT ADDITIONAL VIEWS (CT OF THE RIGHT HIP) Small field-of-view reformatted images through the right hip were obtained. There is no evidence of acute fracture or dislocation. There are mild right hip degenerative changes. There are stable degenerative changes at the right sacroiliac joint and bilateral sacral ala sclerosis. No significant soft tissue abnormalities in the proximal right thigh. Electronically Signed   By: Richardean Sale M.D.   On: 07/28/2020 14:20   DG Hip  Unilat W or Wo Pelvis 2-3 Views Right  Result Date: 07/28/2020 CLINICAL DATA:  Right hip pain after fall. EXAM: DG HIP (WITH OR WITHOUT PELVIS) 2-3V RIGHT COMPARISON:  None. FINDINGS: There is no evidence of hip fracture or dislocation. There is no evidence of arthropathy or other focal bone abnormality. IMPRESSION: Negative. Electronically Signed   By: Marijo Conception M.D.   On: 07/28/2020 12:46    Procedures Procedures (including critical care time)  Medications Ordered in ED Medications  sodium chloride 0.9 % bolus 1,000 mL (1,000 mLs Intravenous New Bag/Given 07/28/20 1325)  iohexol (OMNIPAQUE) 300 MG/ML solution 100 mL (100 mLs Intravenous Contrast Given 07/28/20 1338)    ED Course  I have reviewed the triage vital signs and the nursing notes.  Pertinent labs & imaging results that were available during my care of the patient were reviewed by me and considered in my medical decision making (see chart for details).    MDM Rules/Calculators/A&P                          Patient with rhabdo and multiple falls she will be admitted to medicine     This patient presents to the ED for concern of falls this involves an extensive number of treatment options, and is a complaint that carries with it a high risk of complications and morbidity.  The differential diagnosis includes rhabdo  Lab Tests:   I Ordered, reviewed, and interpreted labs, which included CBC and chemistries and CK.  CK was 2000  Medicines ordered:   I ordered medication normal saline for dehydration  Imaging Studies ordered:   I ordered imaging studies which included CT abdomen pelvis  I independently visualized and interpreted imaging which showed extensive changes in back  Additional history obtained:   Additional history obtained from record  Previous records obtained and reviewed.  Consultations Obtained:   I consulted hospitalist and discussed lab and imaging findings  Reevaluation:  After the  interventions stated above, I reevaluated the patient and found no improvement  Critical Interventions:  .   Final Clinical Impression(s) / ED Diagnoses Final diagnoses:  Fall  Blunt trauma of hip    Rx / DC Orders ED Discharge Orders    None       Milton Ferguson, MD 07/31/20 1039

## 2020-07-28 NOTE — ED Notes (Signed)
Call from daughter Amamda Curbow [phone]  She would appreciate an update when one is available

## 2020-07-28 NOTE — H&P (Signed)
TRH H&P   Patient Demographics:    Susan Odom, is a 74 y.o. female  MRN: [id number]   DOB - September 08, 1946  Admit Date - 07/28/2020  Outpatient Primary MD for the patient is Redmond School, MD  Referring MD/NP/PA: Dr Roderic Palau  Patient coming from: Home  Chief Complaint  Patient presents with  . Fall      HPI:    Susan Odom  is a 75 y.o. female, history of diabetes mellitus, hypertension, subdural hematoma, patient presents to ED secondary to fall, daughter at bedside assist with the history, over the last few weeks, patient has been less mobile, very to obesity, chronic lower back pain, and unsteady gait, patient reports she fell at 6:00 yesterday, she was trying to go to the fridge to get some ice water, reports she did trip at the edge of the recliner, she remained on the floor till 9 AM but her daughter came to check on her and found her on the floor where she called EMS, patient denies any dizziness, lightheadedness, loss of consciousness, focal tingling or numbness, but she fell on the right hip, she remained on the floor laying on the right side, is very tender, daughter report this is patient's third fall over last month, she is sleeping on recliner, she is more deconditioned, less ambulatory, spends most of the time sitting or sleeping in the recliner, he did develop sacral pressure ulcer over last 2 weeks as well per her daughter. - in ED blood work significant for total CK of 2200, but her creatinine was 0.6, hemoglobin was 16.4, glucose was 202, CT abdomen pelvis with no acute finding in the hip area, 3 significant for severe degenerative disease, but no acute findings, giving her rhabdomyolysis, and poor mobility, [facility] hospitalist requested to admit.    Review of systems:    In addition to the HPI above,  No Fever-chills, multiple falls, 3 over last month. No Headache,  No changes with Vision or hearing, No problems swallowing food or Liquids, No Chest pain, Cough or Shortness of Breath, No Abdominal pain, No Nausea or Vommitting, Bowel movements are regular, No Blood in stool or Urine, No dysuria, No new skin rashes or bruises, Patient complains of chronic lower back pain, and right hip pain, multiple falls Denies any new focal deficits, but overall she is very weak No recent weight gain or loss, No polyuria, polydypsia or polyphagia, No significant Mental Stressors.  A full 10 point Review of Systems was done, except as stated above, all other Review of Systems were negative.   With Past History of the following :    Past Medical History:  Diagnosis Date  . Diabetes mellitus   . Hypertension       History reviewed. No pertinent surgical history.    Social History:     Social History   Tobacco Use  . Smoking status:  Never Smoker  . Smokeless tobacco: Never Used  Substance Use Topics  . Alcohol use: No     Lives -at home by herself, daughter checks on her frequently Mobility -sleeps in recliner, use walker    Family History :    History reviewed. No pertinent family history.    Home Medications:   Prior to Admission medications   Medication Sig Start Date End Date Taking? Authorizing Provider  acetaminophen (TYLENOL) 500 MG tablet Take 1,000 mg by mouth every 6 (six) hours as needed for pain.    [provider]  ALPRAZolam Duanne Moron) 1 MG tablet Take 1 mg by mouth 4 (four) times daily.    [provider]  dexamethasone (DECADRON) 1 MG tablet 2 tablets twice daily for 2 days, one tablet twice daily for 2 days, one tablet daily for 2 days. 08/29/13   Kristeen Miss, MD  HYDROcodone-acetaminophen (NORCO/VICODIN) 5-325 MG per tablet Take 1 tablet by mouth every 6 (six) hours as needed (Severe pain). 08/29/13   Kristeen Miss, MD  losartan (COZAAR) 100 MG tablet Take 100 mg by mouth daily.    [provider]    metFORMIN (GLUCOPHAGE) 500 MG tablet Take 1,000 mg by mouth 2 (two) times daily.    [provider]     Allergies:    No Known Allergies   Physical Exam:   Vitals  Blood pressure (!) 154/87, pulse 88, temperature 98.1 F (36.7 C), temperature source Oral, resp. rate 13, height 5\' 10"  (1.778 m), weight 102.1 kg, SpO2 97 %.   1. General elderly female,  laying in bed, no apparent distress  2. Normal affect and insight, Not Suicidal or Homicidal, Awake Alert, Oriented X 3.  3. No F.N deficits, ALL C.Nerves Intact, patient with weakness in lower extremities related to pain, but overall she is 4/5 in lower extremity, 5/5 in upper extremities   4. Ears and Eyes appear Normal, Conjunctivae clear, PERRLA. Moist Oral Mucosa.  5. Supple Neck, No JVD, No cervical lymphadenopathy appriciated, No Carotid Bruits.  6. Symmetrical Chest wall movement, Good air movement bilaterally, CTAB.  7. RRR, No Gallops, Rubs or Murmurs, No Parasternal Heave.  8. Positive Bowel Sounds, Abdomen Soft, No organomegaly appriciated,No rebound -guarding or rigidity.  9.  No Cyanosis, Normal Skin Turgor, patient with stage II sacral pressure ulcer, please review picture below  10. Good muscle tone,  joints appear normal , no effusions, range of motion limited mainly in the right hip area secondary to pain.  11. No Palpable Lymph Nodes in Neck or Axillae       Data Review:    CBC Recent Labs  Lab 07/28/20 1243  WBC 9.1  HGB 16.4*  HCT 50.3*  PLT 167  MCV 93.5  MCH 30.5  MCHC 32.6  RDW 13.8  LYMPHSABS 0.6*  MONOABS 0.4  EOSABS 0.4  BASOSABS 0.0   ------------------------------------------------------------------------------------------------------------------  Chemistries  Recent Labs  Lab 07/28/20 1243  NA 143  K 3.9  CL 106  CO2 22  GLUCOSE 202*  BUN 12  CREATININE 0.61  CALCIUM 9.9  AST 42*  ALT 21  ALKPHOS 66  BILITOT 2.2*    ------------------------------------------------------------------------------------------------------------------ estimated creatinine clearance is 79.8 mL/min (by C-G formula based on SCr of 0.61 mg/dL). ------------------------------------------------------------------------------------------------------------------ No results for input(s): TSH, T4TOTAL, T3FREE, THYROIDAB in the last 72 hours.  Invalid input(s): FREET3  Coagulation profile No results for input(s): INR, PROTIME in the last 168 hours. ------------------------------------------------------------------------------------------------------------------- No results for input(s): DDIMER in  the last 72 hours. -------------------------------------------------------------------------------------------------------------------  Cardiac Enzymes No results for input(s): CKMB, TROPONINI, MYOGLOBIN in the last 168 hours.  Invalid input(s): CK ------------------------------------------------------------------------------------------------------------------ No results found for: BNP   ---------------------------------------------------------------------------------------------------------------  Urinalysis    Component Value Date/Time   COLORURINE YELLOW 08/23/2013 1404   APPEARANCEUR TURBID (A) 08/23/2013 1404   LABSPEC 1.013 08/23/2013 1404   PHURINE 5.0 08/23/2013 1404   GLUCOSEU NEGATIVE 08/23/2013 1404   HGBUR NEGATIVE 08/23/2013 1404   BILIRUBINUR NEGATIVE 08/23/2013 1404   KETONESUR NEGATIVE 08/23/2013 1404   PROTEINUR 30 (A) 08/23/2013 1404   UROBILINOGEN 0.2 08/23/2013 1404   NITRITE NEGATIVE 08/23/2013 1404   LEUKOCYTESUR MODERATE (A) 08/23/2013 1404    ----------------------------------------------------------------------------------------------------------------   Imaging Results:    DG Chest 1 View  Result Date: 07/28/2020 CLINICAL DATA:  Fall last night. EXAM: CHEST  1 VIEW COMPARISON:  August 22, 2013. FINDINGS: Stable cardiomediastinal silhouette. Mild bibasilar subsegmental atelectasis is noted. No pneumothorax or pleural effusion is noted. Bony thorax is unremarkable. IMPRESSION: Mild bibasilar subsegmental atelectasis. Electronically Signed   By: Marijo Conception M.D.   On: 07/28/2020 12:47   DG Lumbar Spine Complete  Result Date: 07/28/2020 CLINICAL DATA:  Lower back pain after fall last night. EXAM: LUMBAR SPINE - COMPLETE 4+ VIEW COMPARISON:  None. FINDINGS: No fracture or significant spondylolisthesis is noted. Moderate to severe degenerative disc disease is noted at all levels of the lumbar spine. 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 No acute abnormality seen in the lumbar spine. Electronically Signed   By: Marijo Conception M.D.   On: 07/28/2020 12:49   CT ABDOMEN PELVIS W CONTRAST  Result Date: 07/28/2020 CLINICAL DATA:  Low back and right hip pain since falling from bed last night. EXAM: CT ABDOMEN AND PELVIS WITH CONTRAST TECHNIQUE: Multidetector CT imaging of the abdomen and pelvis was performed using the standard protocol following bolus administration of intravenous contrast. CONTRAST:  139mL OMNIPAQUE IOHEXOL 300 MG/ML  SOLN COMPARISON:  Lumbar spine and right hip radiographs same date. CT lumbar spine 07/13/2020. FINDINGS: Lower chest: Atherosclerosis of the aorta and coronary arteries. Possible calcifications of the aortic valve. The heart size is normal. There is no significant pleural or pericardial effusion. Mild atelectasis is present at both lung bases. Hepatobiliary: The liver is normal in density without suspicious focal abnormality. No evidence of gallstones, gallbladder wall thickening or biliary dilatation. Pancreas: Unremarkable. No pancreatic ductal dilatation or surrounding inflammatory changes. Spleen: Small central calcifications, likely granulomas. No splenomegaly or acute abnormality. Adrenals/Urinary Tract: Both adrenal glands appear  normal. No evidence of acute kidney injury, urinary tract calculus or hydronephrosis. 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 Dependent high density in the right aspect of the bladder may reflect a small bladder calculus. No definite contrast excretion into the bladder. Stomach/Bowel: No evidence of bowel wall thickening, distention or surrounding inflammatory change. Vascular/Lymphatic: There are no enlarged abdominal or pelvic lymph nodes. No evidence of retroperitoneal hematoma. There is diffuse aortic and branch vessel atherosclerosis. As above, stable right renal artery aneurysm. Reproductive: Probable fiducial clips in the lower uterine segment. The uterus and ovaries otherwise appear normal. Other: Intact anterior abdominal wall. No ascites, hemoperitoneum or pneumoperitoneum. Musculoskeletal: No acute or significant osseous findings. There is a stable chronic superior endplate compression deformity at L1. There is stable multilevel lumbar spondylosis. Stable sclerosis in the sacral ala  bilaterally. IMPRESSION: 1. No acute findings or explanation for the patient's symptoms. 2. 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 3. Possible small bladder calculus. 4. Aortic Atherosclerosis (ICD10-I70.0). CT ADDITIONAL VIEWS (CT OF THE RIGHT HIP) Small field-of-view reformatted images through the right hip were obtained. There is no evidence of acute fracture or dislocation. There are mild right hip degenerative changes. There are stable degenerative changes at the right sacroiliac joint and bilateral sacral ala sclerosis. No significant soft tissue abnormalities in the proximal right thigh. Electronically Signed   By: Richardean Sale M.D.   On: 07/28/2020 14:20   CT NO CHARGE  Result Date: 07/28/2020 CLINICAL DATA:  Low back and right hip pain since falling from bed last  night. EXAM: CT ABDOMEN AND PELVIS WITH CONTRAST TECHNIQUE: Multidetector CT imaging of the abdomen and pelvis was performed using the standard protocol following bolus administration of intravenous contrast. CONTRAST:  116mL OMNIPAQUE IOHEXOL 300 MG/ML  SOLN COMPARISON:  Lumbar spine and right hip radiographs same date. CT lumbar spine 07/13/2020. FINDINGS: Lower chest: Atherosclerosis of the aorta and coronary arteries. Possible calcifications of the aortic valve. The heart size is normal. There is no significant pleural or pericardial effusion. Mild atelectasis is present at both lung bases. Hepatobiliary: The liver is normal in density without suspicious focal abnormality. No evidence of gallstones, gallbladder wall thickening or biliary dilatation. Pancreas: Unremarkable. No pancreatic ductal dilatation or surrounding inflammatory changes. Spleen: Small central calcifications, likely granulomas. No splenomegaly or acute abnormality. Adrenals/Urinary Tract: Both adrenal glands appear normal. No evidence of acute kidney injury, urinary tract calculus or hydronephrosis. 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 Dependent high density in the right aspect of the bladder may reflect a small bladder calculus. No definite contrast excretion into the bladder. Stomach/Bowel: No evidence of bowel wall thickening, distention or surrounding inflammatory change. Vascular/Lymphatic: There are no enlarged abdominal or pelvic lymph nodes. No evidence of retroperitoneal hematoma. There is diffuse aortic and branch vessel atherosclerosis. As above, stable right renal artery aneurysm. Reproductive: Probable fiducial clips in the lower uterine segment. The uterus and ovaries otherwise appear normal. Other: Intact anterior abdominal wall. No ascites, hemoperitoneum or pneumoperitoneum. Musculoskeletal: No  acute or significant osseous findings. There is a stable chronic superior endplate compression deformity at L1. There is stable multilevel lumbar spondylosis. Stable sclerosis in the sacral ala bilaterally. IMPRESSION: 1. No acute findings or explanation for the patient's symptoms. 2. 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 3. Possible small bladder calculus. 4. Aortic Atherosclerosis (ICD10-I70.0). CT ADDITIONAL VIEWS (CT OF THE RIGHT HIP) Small field-of-view reformatted images through the right hip were obtained. There is no evidence of acute fracture or dislocation. There are mild right hip degenerative changes. There are stable degenerative changes at the right sacroiliac joint and bilateral sacral ala sclerosis. No significant soft tissue abnormalities in the proximal right thigh. Electronically Signed   By: Richardean Sale M.D.   On: 07/28/2020 14:20   DG Hip Unilat W or Wo Pelvis 2-3 Views Right  Result Date: 07/28/2020 CLINICAL DATA:  Right hip pain after fall. EXAM: DG HIP (WITH OR WITHOUT PELVIS) 2-3V RIGHT COMPARISON:  None. FINDINGS: There is no evidence of hip fracture or dislocation. There is no evidence of arthropathy or other focal bone abnormality. IMPRESSION: Negative. Electronically Signed   By: Marijo Conception M.D.   On: 07/28/2020 12:46  My personal review of EKG: Rhythm NSR, Rate  84 /min, QTc 456   Assessment & Plan:    Active Problems:   Rhabdomyolysis   Fall at home, initial encounter   Pressure ulcer   Generalized weakness   Rhabdomyolysis -Mild, with total CK of 2200, but given she is on Metformin, received IV contrast, she will be admitted for IV hydration, received 1 L bolus in ED, I will continue with IV gentle hydration at 50 cc of normal saline, and will repeat BMP in a.m., will check total CK daily, will avoid nephrotoxic medications, will hold Metformin.  Fall/unsteady gait/generalized weakness -Patient with multiple falls over last month,  this is her third fall, she does appear to be with lumbar radiculopathy. -He is almost has been bedbound over last few weeks because of that, will consult PT/OT. -I have discussed at length with the daughter, and patient, she is very likely having lumbar radiculopathy causing all of the symptoms, she will need MRI of her lumbar spine, have discussed with her to schedule with PCP next week regarding that. -Check B12 level  Sacral pressure ulcer stage II -This is due to the fact she is less ambulatory due to falls and lower back pain, will consult wound care.  Hypertension -Weighted, most likely to pain, resume home medication, will add low-dose Norvasc 2.5 mg, and as needed hydralazine.  Diabetes mellitus -Hold Metformin, especially she received IV contrast, and she is in rhabdomyolysis, keep on insulin sliding scale.  Of note initial EKG reads as A. fib, but this is related to artifact, I have repeated EKG, which confirms normal sinus rhythm.   DVT Prophylaxis  Lovenox   AM Labs Ordered, also please review Full Orders  Family Communication: Admission, patients condition and plan of care including tests being ordered have been discussed with the patient and daughter  who indicate understanding and agree with the plan and Code Status.  Code Status Full  Likely DC to  Pending PT evaluation  Condition GUARDED    Consults called: None  Admission status:  observation  Time spent in minutes : 55 minutes   Phillips Climes M.D on 07/28/2020 at 3:50 PM   [facility] - Office  [phone]

## 2020-07-28 NOTE — ED Triage Notes (Signed)
Pt fell last night at home and laid in floor all night.  Daughter found her in the floor.

## 2020-07-29 DIAGNOSIS — Z6832 Body mass index (BMI) 32.0-32.9, adult: Secondary | ICD-10-CM | POA: Insufficient documentation

## 2020-07-29 DIAGNOSIS — W19XXXD Unspecified fall, subsequent encounter: Secondary | ICD-10-CM | POA: Diagnosis not present

## 2020-07-29 DIAGNOSIS — R531 Weakness: Secondary | ICD-10-CM

## 2020-07-29 DIAGNOSIS — T796XXS Traumatic ischemia of muscle, sequela: Secondary | ICD-10-CM | POA: Diagnosis not present

## 2020-07-29 DIAGNOSIS — S79811D Other specified injuries of right hip, subsequent encounter: Secondary | ICD-10-CM

## 2020-07-29 DIAGNOSIS — S79819A Other specified injuries of unspecified hip, initial encounter: Secondary | ICD-10-CM | POA: Insufficient documentation

## 2020-07-29 DIAGNOSIS — L8942 Pressure ulcer of contiguous site of back, buttock and hip, stage 2: Secondary | ICD-10-CM | POA: Diagnosis not present

## 2020-07-29 DIAGNOSIS — E6609 Other obesity due to excess calories: Secondary | ICD-10-CM

## 2020-07-29 DIAGNOSIS — I10 Essential (primary) hypertension: Secondary | ICD-10-CM | POA: Insufficient documentation

## 2020-07-29 LAB — CBC
HCT: 45.6 % (ref 36.0–46.0)
Hemoglobin: 14.6 g/dL (ref 12.0–15.0)
MCH: 30.2 pg (ref 26.0–34.0)
MCHC: 32 g/dL (ref 30.0–36.0)
MCV: 94.2 fL (ref 80.0–100.0)
Platelets: 167 10*3/uL (ref 150–400)
RBC: 4.84 MIL/uL (ref 3.87–5.11)
RDW: 14 % (ref 11.5–15.5)
WBC: 7.6 10*3/uL (ref 4.0–10.5)
nRBC: 0 % (ref 0.0–0.2)

## 2020-07-29 LAB — CK: Total CK: 1053 U/L — ABNORMAL HIGH (ref 38–234)

## 2020-07-29 MED ORDER — ENOXAPARIN SODIUM 40 MG/0.4ML ~~LOC~~ SOLN
40.0000 mg | SUBCUTANEOUS | Status: DC
  Administered 2020-07-29: 40 mg via SUBCUTANEOUS
  Filled 2020-07-29: qty 0.4

## 2020-07-29 MED ORDER — LOSARTAN POTASSIUM 25 MG PO TABS
100.0000 mg | ORAL_TABLET | Freq: Every day | ORAL | Status: DC
  Administered 2020-07-29: 100 mg via ORAL
  Filled 2020-07-29: qty 4

## 2020-07-29 MED ORDER — METHOCARBAMOL 500 MG PO TABS: 500.0000 mg | ORAL_TABLET | Freq: Two times a day (BID) | ORAL | 0 refills | Status: DC | PRN

## 2020-07-29 MED ORDER — AMLODIPINE BESYLATE 10 MG PO TABS: 5.0000 mg | ORAL_TABLET | Freq: Every day | ORAL | Status: DC

## 2020-07-29 NOTE — TOC Transition Note (Signed)
Transition of Care [facility]) - CM/SW Discharge Note   Patient Details  Name: Susan Odom MRN: [id number] Date of Birth: Jan 29, 1946  [facility] of Care [facility]) CM/SW Contact:  Natasha Bence, LCSW Phone Number: 07/29/2020, 2:21 PM   Clinical Narrative:    CSW received consult for SNF. Patient and family refused SNF and reported that the daughter provides majority of patient's care at home. CSW offered HH. Family agreeable to [facility]. CSW placed referral with [facility]. Georgina Snell with [facility] agreeable to providing services for patient. TOC signing off.   Final next level of care: [place] Barriers to Discharge: Barriers Resolved   Patient Goals and CMS Choice        Discharge Placement                  Name of family member notified: [name] [place] Patient and family notified of of transfer: 07/29/20  Discharge Plan and Services                            [place]: [place] Date [place]: 07/29/20 Time [place]: 1217 Representative spoke with at [place]: [place] Determinants of Health ([place]) Interventions     Readmission Risk Interventions No flowsheet data found.

## 2020-07-29 NOTE — Plan of Care (Signed)
°  Problem: Acute Rehab PT Goals(only PT should resolve) Goal: Pt will Roll Supine to Side Flowsheets (Taken 07/29/2020 1134) Pt will Roll Supine to Side:  with mod assist  with rail Goal: Pt Will Go Supine/Side To Sit Flowsheets (Taken 07/29/2020 1134) Pt will go Supine/Side to Sit: with moderate assist Goal: Pt Will Go Sit To Supine/Side Flowsheets (Taken 07/29/2020 1134) Pt will go Sit to Supine/Side: with moderate assist Goal: Patient Will Perform Sitting Balance Flowsheets (Taken 07/29/2020 1134) Patient will perform sitting balance:  with minimal assist  with bilateral UE support   11:36 AM, 07/29/20 [name], DPT Physical Therapy with [facility]  [phone] office

## 2020-07-29 NOTE — Evaluation (Signed)
Physical Therapy Evaluation Patient Details Name: Susan Odom MRN: [id number] DOB: 01-30-1946 Today's Date: 07/29/2020   History of Present Illness  Susan Odom  is a 74 y.o. female, history of diabetes mellitus, hypertension, subdural hematoma, patient presents to ED secondary to fall, daughter at bedside assist with the history, over the last few weeks, patient has been less mobile, very to obesity, chronic lower back pain, and unsteady gait, patient reports she fell at 6:00 yesterday, she was trying to go to the fridge to get some ice water, reports she did trip at the edge of the recliner, she remained on the floor till 9 AM but her daughter came to check on her and found her on the floor where she called EMS, patient denies any dizziness, lightheadedness, loss of consciousness, focal tingling or numbness, but she fell on the right hip, she remained on the floor laying on the right side, is very tender, daughter report this is patient's third fall over last month, she is sleeping on recliner, she is more deconditioned, less ambulatory, spends most of the time sitting or sleeping in the recliner, he did develop sacral pressure ulcer over last 2 weeks as well per her daughter.   Clinical Impression   Patient agreeable to therapy but fatigued, returned half hour later and patient more alert for PT evaluation. Patient completely soiled in bed. Nurse assisted with all bed mobilities for left and right rolling. Patient required verbal cues to grab guard rail to assist with rolling and required total assist to maintain position in side lying. Unable to assess sitting or walking on this date secondary to fatigue, weakness and discomfort along groin region. Discussed benefits of transitioning to SNF prior to home and patient not agreeable to SNF at this time. Given current level of assist and only PRN assist from her daughter, additional care at home is needed for safe return home.  Patient will continue  to benefit from skilled physical therapy in hospital and recommended venue below to increase strength, balance, endurance for safe ADLs and gait.    Follow Up Recommendations SNF;Supervision for mobility/OOB;Supervision/Assistance - 24 hour    Equipment Recommendations  None recommended by PT    Recommendations for Other Services       Precautions / Restrictions Precautions Precautions: Fall Restrictions Weight Bearing Restrictions: No      Mobility  Bed Mobility Overal bed mobility: Needs Assistance Bed Mobility: Rolling Rolling: Total assist;+2 for physical assistance         General bed mobility comments: rolling left and right verbal cues for patietn to grab bar and assist with rolling with 2 to maintain either left or right side lying. Discomfort noted with rolling and patient unable to sit up due to fatigue and weakness   Transfers                    Ambulation/Gait                Stairs            Wheelchair Mobility    Modified Rankin (Stroke Patients Only)       Balance                                             Pertinent Vitals/Pain      Home Living Family/patient expects to be discharged to::  Private residence Living Arrangements: Alone Available Help at Discharge: Family;Available PRN/intermittently Type of Home: House Home Access: Level entry     Home Layout: One level Home Equipment: Walker - 2 wheels;Cane - single point;Bedside commode;Shower seat      Prior Function Level of Independence: Independent with assistive device(s);Needs assistance      ADL's / Homemaking Assistance Needed: able to cook and clean around home, does not drive. uses RW for all ambulation        Hand Dominance        Extremity/Trunk Assessment   Upper Extremity Assessment Upper Extremity Assessment: Defer to OT evaluation    Lower Extremity Assessment Lower Extremity Assessment: Generalized weakness        Communication   Communication: No difficulties  Cognition Arousal/Alertness: Awake/alert Behavior During Therapy: WFL for tasks assessed/performed Overall Cognitive Status: Within Functional Limits for tasks assessed                                        General Comments      Exercises     Assessment/Plan    PT Assessment    PT Problem List         PT Treatment Interventions      PT Goals (Current goals can be found in the Care Plan section)  Acute Rehab PT Goals Patient Stated Goal: to go home PT Goal Formulation: With patient Time For Goal Achievement: 08/12/20 Potential to Achieve Goals: Fair    Frequency     Barriers to discharge        Co-evaluation               AM-PAC PT "6 Clicks" Mobility  Outcome Measure Help needed turning from your back to your side while in a flat bed without using bedrails?: Total Help needed moving from lying on your back to sitting on the side of a flat bed without using bedrails?: Total Help needed moving to and from a bed to a chair (including a wheelchair)?: Total Help needed standing up from a chair using your arms (e.g., wheelchair or bedside chair)?: Total Help needed to walk in hospital room?: Total Help needed climbing 3-5 steps with a railing? : Total 6 Click Score: 6    End of Session              Time: 6269-4854 PT Time Calculation (min) (ACUTE ONLY): 30 min   Charges:   PT Evaluation $PT Eval Low Complexity: 1 Low PT Treatments $Therapeutic Activity: 8-22 mins   11:34 AM, 07/29/20 [name], DPT Physical Therapy with [facility]  [phone] office

## 2020-07-29 NOTE — Discharge Summary (Signed)
Physician Discharge Summary  Susan Odom BHA:[id number] DOB: 12-27-1945 DOA: 07/28/2020  PCP: Redmond School, MD  Admit date: 07/28/2020 Discharge date: 07/29/2020  Time spent: 35 minutes  Recommendations for Outpatient Follow-up:  Please review initiation of B12 supplementation and check vitamin D level (with further repletion/maintenance as needed). Reassess blood pressure and adjust antihypertensive regimen as required Continue to assess patient with ongoing physical deconditioning and home health needs.  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  Home health services for PT has been arranged.  Family looking to provide closer assistance/care and supervision, and will arrange for private care.  CODE STATUS: Full code.  Diet recommendation: Low calorie, heart healthy and modified carbohydrates diet.  Filed Weights   07/28/20 1111  Weight: 102.1 kg    History of present illness:  As per H&P written by Dr. Waldron Labs on 07/28/2020 Susan Odom  is a 74 y.o. female, history of diabetes mellitus, hypertension, subdural hematoma, patient presents to ED secondary to fall, daughter at bedside assist with the history, over the last few weeks, patient has been less mobile, mainly due to obesity, chronic lower back pain, and unsteady gait, patient reports she fell at 6:00 pm yesterday, she was trying to go to the fridge to get some ice water, reports she did trip at the edge of the recliner, she remained on the floor till 9 AM, when her daughter came to check on her and found her on the floor; she called EMS, patient denies any dizziness, lightheadedness, loss of  consciousness, focal tingling or numbness, but she fell on the right hip, she remained on the floor laying on the right side, is very tender, daughter report this is patient's third fall over last month, she is sleeping on recliner, she is more deconditioned, less ambulatory, spends most of the time sitting or sleeping in the recliner, She did develop sacral pressure ulcer over last 2-3 weeks as well per her daughter. - in ED blood work significant for total CK of 2200, but her creatinine was 0.6, hemoglobin was 16.4, glucose was 202, CT abdomen pelvis with no acute findings for fx's in the hip area, severe significant degenerative disease, but no acute findings on her back. Giving her rhabdomyolysis, and poor mobility, [facility] hospitalist requested to place in the hospital for further evaluation and management.  Hospital Course:  1-mild traumatic rhabdomyolysis -Significant improvement after fluid resuscitation given -Patient eating and drinking at this time -Normal renal function and electrolytes. -Instructed to keep yourself well-hydrated -Follow-up with PCP in 10 days. -CK level at discharge low thousand range; no impediments appreciated for patient to maintain adequate oral fluid intake.  2-physical deconditioning/weakness/unsteady gait/recent falls -Physical therapy has seen patient and recommended skilled nursing facility -Patient has declined these services -Home health PT has been requested -Patient looking to hire private care at this time. -B12 borderline low normal; after discussed with patient he has opted to follow-up with PCP before starting  intramuscular supplementation; which is what I would recommend at this time for the next 5 to 6 months before starting oral maintenance.. -Will also recommend checking vitamin D level and further replete as needed.  3-stage II pressure ulcer: Present on admission. -Please refer  to epic media for pictures -No signs of superimposed  infection -Continue barrier cream and preventive measures -Patient definitely needs to increase physical activity and offload pressure to the area. -The use of a donut hole cushion can assist when sitting down.  4-essential hypertension -Stable overall -Will resume home antihypertensive regimen -Patient advised to follow heart healthy diet.  5-type 2 diabetes mellitus -Resume home oral hypoglycemic agents -Advised to maintain adequate hydration -Instructed to follow modified carbohydrate diet.  6-class I obesity -Low calorie diet, portion control and increase physical activity discussed with patient. -Body mass index is 32.28 kg/m.   Procedures:  See below for x-ray reports  Consultations:  None  Discharge Exam: Vitals:   07/29/20 0030 07/29/20 0230  BP: (!) 142/51 (!) 136/58  Pulse: 75 79  Resp: (!) 26 (!) 25  Temp:    SpO2: 95% 93%    General: Afebrile, no chest pain, no nausea, no vomiting, no abdominal pain.  Reporting feeling her legs to be heavy and having usual chronic lower back discomfort.  She has expressed no desire to go to any skilled nursing facility but is in acceptance of home health services to be see her at home. Cardiovascular: S1 and S2, no rubs, no gallops, no JVD on exam. Respiratory: Good air movement bilaterally, no cough, no wheezing. Abdomen: Soft, obese, nontender, nondistended, positive bowel sounds Extremities: No edema appreciated, no cyanosis or clubbing.  Patient reports hip on the right hip and that is decreased range of motion and just overall decrease strength due to deconditioning.  Discharge Instructions   Discharge Instructions    Diet - low sodium heart healthy   Complete by: As directed    Discharge instructions   Complete by: As directed    Take medications as prescribed Maintain adequate hydration Arrange follow-up with PCP in 10 days Follow instructions and recommendations by home health physical therapy staff.      Allergies as of 07/29/2020   No Known Allergies     Medication List    TAKE these medications   acetaminophen 500 MG tablet Commonly known as: TYLENOL Take 1,000 mg by mouth every 6 (six) hours as needed for pain.   ALPRAZolam 1 MG tablet Commonly known as: XANAX Take 1 mg by mouth 4 (four) times daily.   amLODipine 10 MG tablet Commonly known as: NORVASC Take 0.5 tablets (5 mg total) by mouth daily. What changed: how much to take   gabapentin 600 MG tablet Commonly known as: NEURONTIN Take 600 mg by mouth 3 (three) times daily.   HYDROcodone-acetaminophen 10-325 MG tablet Commonly known as: NORCO Take 1 tablet by mouth 3 (three) times daily as needed.   IBU 800 MG tablet Generic drug: ibuprofen Take 800 mg by mouth every 8 (eight) hours as needed for pain.   Invokana 100 MG Tabs tablet Generic drug: canagliflozin Take 100 mg by mouth daily before breakfast.   losartan 100 MG tablet Commonly known as: COZAAR Take 100 mg by mouth daily.   methocarbamol 500 MG tablet Commonly known as: Robaxin Take 1 tablet (500 mg total) by mouth every 12 (twelve) hours as needed for muscle spasms.   Vitamin D (Ergocalciferol) 1.25 MG (50000 UNIT) Caps capsule Commonly known as: DRISDOL Take 50,000 Units by mouth once a week.      No Known Allergies  Follow-up Information    Redmond School, MD. Schedule an appointment as soon as possible for a visit in 10 day(s).   Specialty: Internal Medicine  Contact information: [address] [place] [address] [phone]               The results of significant diagnostics from this hospitalization (including imaging, microbiology, ancillary and laboratory) are listed below for reference.    Significant Diagnostic Studies: DG Chest 1 View  Result Date: 07/28/2020 CLINICAL DATA:  Fall last night. EXAM: CHEST  1 VIEW COMPARISON:  August 22, 2013. FINDINGS: Stable cardiomediastinal silhouette. Mild bibasilar  subsegmental atelectasis is noted. No pneumothorax or pleural effusion is noted. Bony thorax is unremarkable. IMPRESSION: Mild bibasilar subsegmental atelectasis. Electronically Signed   By: Marijo Conception M.D.   On: 07/28/2020 12:47   DG Lumbar Spine Complete  Result Date: 07/28/2020 CLINICAL DATA:  Lower back pain after fall last night. EXAM: LUMBAR SPINE - COMPLETE 4+ VIEW COMPARISON:  None. FINDINGS: No fracture or significant spondylolisthesis is noted. Moderate to severe degenerative disc disease is noted at all levels of the lumbar spine. 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 No acute abnormality seen in the lumbar spine. Electronically Signed   By: Marijo Conception M.D.   On: 07/28/2020 12:49   CT ABDOMEN PELVIS W CONTRAST  Result Date: 07/28/2020 CLINICAL DATA:  Low back and right hip pain since falling from bed last night. EXAM: CT ABDOMEN AND PELVIS WITH CONTRAST TECHNIQUE: Multidetector CT imaging of the abdomen and pelvis was performed using the standard protocol following bolus administration of intravenous contrast. CONTRAST:  191mL OMNIPAQUE IOHEXOL 300 MG/ML  SOLN COMPARISON:  Lumbar spine and right hip radiographs same date. CT lumbar spine 07/13/2020. FINDINGS: Lower chest: Atherosclerosis of the aorta and coronary arteries. Possible calcifications of the aortic valve. The heart size is normal. There is no significant pleural or pericardial effusion. Mild atelectasis is present at both lung bases. Hepatobiliary: The liver is normal in density without suspicious focal abnormality. No evidence of gallstones, gallbladder wall thickening or biliary dilatation. Pancreas: Unremarkable. No pancreatic ductal dilatation or surrounding inflammatory changes. Spleen: Small central calcifications, likely granulomas. No splenomegaly or acute abnormality. Adrenals/Urinary Tract: Both adrenal glands appear normal. No evidence of acute kidney injury, urinary tract calculus or  hydronephrosis. 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 Dependent high density in the right aspect of the bladder may reflect a small bladder calculus. No definite contrast excretion into the bladder. Stomach/Bowel: No evidence of bowel wall thickening, distention or surrounding inflammatory change. Vascular/Lymphatic: There are no enlarged abdominal or pelvic lymph nodes. No evidence of retroperitoneal hematoma. There is diffuse aortic and branch vessel atherosclerosis. As above, stable right renal artery aneurysm. Reproductive: Probable fiducial clips in the lower uterine segment. The uterus and ovaries otherwise appear normal. Other: Intact anterior abdominal wall. No ascites, hemoperitoneum or pneumoperitoneum. Musculoskeletal: No acute or significant osseous findings. There is a stable chronic superior endplate compression deformity at L1. There is stable multilevel lumbar spondylosis. Stable sclerosis in the sacral ala bilaterally. IMPRESSION: 1. No acute findings or explanation for the patient's symptoms. 2. 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 3. Possible small bladder calculus. 4. Aortic Atherosclerosis (ICD10-I70.0). CT ADDITIONAL VIEWS (CT OF THE RIGHT HIP) Small field-of-view reformatted images through the right hip were obtained. There is no evidence of acute fracture or dislocation. There are mild right hip degenerative changes. There are stable degenerative changes at the right sacroiliac joint and bilateral sacral ala sclerosis. No  significant soft tissue abnormalities in the proximal right thigh. Electronically Signed   By: Richardean Sale M.D.   On: 07/28/2020 14:20   CT NO CHARGE  Result Date: 07/28/2020 CLINICAL DATA:  Low back and right hip pain since falling from bed last night. EXAM: CT ABDOMEN AND PELVIS WITH CONTRAST TECHNIQUE:  Multidetector CT imaging of the abdomen and pelvis was performed using the standard protocol following bolus administration of intravenous contrast. CONTRAST:  14mL OMNIPAQUE IOHEXOL 300 MG/ML  SOLN COMPARISON:  Lumbar spine and right hip radiographs same date. CT lumbar spine 07/13/2020. FINDINGS: Lower chest: Atherosclerosis of the aorta and coronary arteries. Possible calcifications of the aortic valve. The heart size is normal. There is no significant pleural or pericardial effusion. Mild atelectasis is present at both lung bases. Hepatobiliary: The liver is normal in density without suspicious focal abnormality. No evidence of gallstones, gallbladder wall thickening or biliary dilatation. Pancreas: Unremarkable. No pancreatic ductal dilatation or surrounding inflammatory changes. Spleen: Small central calcifications, likely granulomas. No splenomegaly or acute abnormality. Adrenals/Urinary Tract: Both adrenal glands appear normal. No evidence of acute kidney injury, urinary tract calculus or hydronephrosis. 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 Dependent high density in the right aspect of the bladder may reflect a small bladder calculus. No definite contrast excretion into the bladder. Stomach/Bowel: No evidence of bowel wall thickening, distention or surrounding inflammatory change. Vascular/Lymphatic: There are no enlarged abdominal or pelvic lymph nodes. No evidence of retroperitoneal hematoma. There is diffuse aortic and branch vessel atherosclerosis. As above, stable right renal artery aneurysm. Reproductive: Probable fiducial clips in the lower uterine segment. The uterus and ovaries otherwise appear normal. Other: Intact anterior abdominal wall. No ascites, hemoperitoneum or pneumoperitoneum. Musculoskeletal: No acute or significant osseous findings. There is a stable  chronic superior endplate compression deformity at L1. There is stable multilevel lumbar spondylosis. Stable sclerosis in the sacral ala bilaterally. IMPRESSION: 1. No acute findings or explanation for the patient's symptoms. 2. 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 3. Possible small bladder calculus. 4. Aortic Atherosclerosis (ICD10-I70.0). CT ADDITIONAL VIEWS (CT OF THE RIGHT HIP) Small field-of-view reformatted images through the right hip were obtained. There is no evidence of acute fracture or dislocation. There are mild right hip degenerative changes. There are stable degenerative changes at the right sacroiliac joint and bilateral sacral ala sclerosis. No significant soft tissue abnormalities in the proximal right thigh. Electronically Signed   By: Richardean Sale M.D.   On: 07/28/2020 14:20   DG Hip Unilat W or Wo Pelvis 2-3 Views Right  Result Date: 07/28/2020 CLINICAL DATA:  Right hip pain after fall. EXAM: DG HIP (WITH OR WITHOUT PELVIS) 2-3V RIGHT COMPARISON:  None. FINDINGS: There is no evidence of hip fracture or dislocation. There is no evidence of arthropathy or other focal bone abnormality. IMPRESSION: Negative. Electronically Signed   By: Marijo Conception M.D.   On: 07/28/2020 12:46    Microbiology: Recent Results (from the past 240 hour(s))  SARS Coronavirus 2 by RT PCR (hospital order, performed in [facility] hospital lab) Nasopharyngeal Nasopharyngeal Swab     Status: None   Collection Time: 07/28/20 11:36 AM   Specimen: Nasopharyngeal Swab  Result Value Ref Range Status   SARS Coronavirus 2 NEGATIVE NEGATIVE Final    Comment: (NOTE) SARS-CoV-2 target nucleic acids are NOT DETECTED.  The SARS-CoV-2 RNA is generally  detectable in upper and lower respiratory specimens during the acute phase of infection. The lowest concentration of SARS-CoV-2 viral copies this assay can detect is 250 copies / mL. A negative result does not preclude SARS-CoV-2 infection and  should not be used as the sole basis for treatment or other patient management decisions.  A negative result may occur with improper specimen collection / handling, submission of specimen other than nasopharyngeal swab, presence of viral mutation(s) within the areas targeted by this assay, and inadequate number of viral copies (<250 copies / mL). A negative result must be combined with clinical observations, patient history, and epidemiological information.  Fact Sheet for Patients:   StrictlyIdeas.no  Fact Sheet for Healthcare Providers: BankingDealers.co.za  This test is not yet approved or  cleared by the [place] FDA and has been authorized for detection and/or diagnosis of SARS-CoV-2 by FDA under an Emergency Use Authorization (EUA).  This EUA will remain in effect (meaning this test can be used) for the duration of the COVID-19 declaration under Section 564(b)(1) of the Act, 21 U.S.C. section 360bbb-3(b)(1), unless the authorization is terminated or revoked sooner.  Performed at [facility], [address], [place], [place] [address]      Labs: Basic Metabolic Panel: Recent Labs  Lab 07/28/20 1243  NA 143  K 3.9  CL 106  CO2 22  GLUCOSE 202*  BUN 12  CREATININE 0.61  CALCIUM 9.9   Liver Function Tests: Recent Labs  Lab 07/28/20 1243  AST 42*  ALT 21  ALKPHOS 66  BILITOT 2.2*  PROT 7.6  ALBUMIN 4.2   CBC: Recent Labs  Lab 07/28/20 1243 07/29/20 0600  WBC 9.1 7.6  NEUTROABS 7.6  --   HGB 16.4* 14.6  HCT 50.3* 45.6  MCV 93.5 94.2  PLT 167 167   Cardiac Enzymes: Recent Labs  Lab 07/28/20 1243 07/29/20 0548  CKTOTAL 2,202* 1,053*    CBG: Recent Labs  Lab 07/28/20 1658  GLUCAP 156*    Signed:  Barton Dubois MD.  [facility] 07/29/2020, 2:01 PM

## 2020-08-06 ENCOUNTER — Other Ambulatory Visit: Payer: Self-pay

## 2020-08-06 ENCOUNTER — Emergency Department (HOSPITAL_COMMUNITY)
Admission: EM | Admit: 2020-08-06 | Discharge: 2020-08-07 | Disposition: A | Discharge status: Home / Self Care | Payer: Medicare Other | Attending: Emergency Medicine | Admitting: Emergency Medicine

## 2020-08-06 ENCOUNTER — Encounter (HOSPITAL_COMMUNITY): Payer: Self-pay

## 2020-08-06 DIAGNOSIS — K219 Gastro-esophageal reflux disease without esophagitis: Secondary | ICD-10-CM | POA: Diagnosis not present

## 2020-08-06 DIAGNOSIS — R0902 Hypoxemia: Secondary | ICD-10-CM | POA: Diagnosis not present

## 2020-08-06 DIAGNOSIS — M5442 Lumbago with sciatica, left side: Secondary | ICD-10-CM | POA: Insufficient documentation

## 2020-08-06 DIAGNOSIS — Z20822 Contact with and (suspected) exposure to covid-19: Secondary | ICD-10-CM | POA: Diagnosis not present

## 2020-08-06 DIAGNOSIS — M5440 Lumbago with sciatica, unspecified side: Secondary | ICD-10-CM

## 2020-08-06 DIAGNOSIS — M5441 Lumbago with sciatica, right side: Secondary | ICD-10-CM | POA: Insufficient documentation

## 2020-08-06 DIAGNOSIS — G529 Cranial nerve disorder, unspecified: Secondary | ICD-10-CM | POA: Diagnosis not present

## 2020-08-06 DIAGNOSIS — E1140 Type 2 diabetes mellitus with diabetic neuropathy, unspecified: Secondary | ICD-10-CM | POA: Diagnosis not present

## 2020-08-06 DIAGNOSIS — Z79899 Other long term (current) drug therapy: Secondary | ICD-10-CM | POA: Insufficient documentation

## 2020-08-06 DIAGNOSIS — T796XXD Traumatic ischemia of muscle, subsequent encounter: Secondary | ICD-10-CM | POA: Diagnosis not present

## 2020-08-06 DIAGNOSIS — G894 Chronic pain syndrome: Secondary | ICD-10-CM | POA: Diagnosis not present

## 2020-08-06 DIAGNOSIS — G834 Cauda equina syndrome: Secondary | ICD-10-CM | POA: Diagnosis not present

## 2020-08-06 DIAGNOSIS — R600 Localized edema: Secondary | ICD-10-CM | POA: Diagnosis not present

## 2020-08-06 DIAGNOSIS — S72491D Other fracture of lower end of right femur, subsequent encounter for closed fracture with routine healing: Secondary | ICD-10-CM | POA: Diagnosis not present

## 2020-08-06 DIAGNOSIS — I10 Essential (primary) hypertension: Secondary | ICD-10-CM | POA: Insufficient documentation

## 2020-08-06 DIAGNOSIS — W19XXXA Unspecified fall, initial encounter: Secondary | ICD-10-CM | POA: Insufficient documentation

## 2020-08-06 DIAGNOSIS — L89152 Pressure ulcer of sacral region, stage 2: Secondary | ICD-10-CM | POA: Diagnosis not present

## 2020-08-06 DIAGNOSIS — M5489 Other dorsalgia: Secondary | ICD-10-CM | POA: Diagnosis not present

## 2020-08-06 DIAGNOSIS — I2510 Atherosclerotic heart disease of native coronary artery without angina pectoris: Secondary | ICD-10-CM | POA: Diagnosis not present

## 2020-08-06 DIAGNOSIS — E119 Type 2 diabetes mellitus without complications: Secondary | ICD-10-CM | POA: Diagnosis not present

## 2020-08-06 DIAGNOSIS — R5381 Other malaise: Secondary | ICD-10-CM | POA: Diagnosis not present

## 2020-08-06 DIAGNOSIS — R52 Pain, unspecified: Secondary | ICD-10-CM

## 2020-08-06 DIAGNOSIS — Z743 Need for continuous supervision: Secondary | ICD-10-CM | POA: Diagnosis not present

## 2020-08-06 NOTE — ED Triage Notes (Signed)
Pt presents to ED with complaints of lower back pain and bilateral leg pain x few days.

## 2020-08-07 ENCOUNTER — Emergency Department (HOSPITAL_COMMUNITY): Payer: Medicare Other

## 2020-08-07 DIAGNOSIS — M1611 Unilateral primary osteoarthritis, right hip: Secondary | ICD-10-CM | POA: Diagnosis not present

## 2020-08-07 DIAGNOSIS — R404 Transient alteration of awareness: Secondary | ICD-10-CM | POA: Diagnosis not present

## 2020-08-07 DIAGNOSIS — C711 Malignant neoplasm of frontal lobe: Secondary | ICD-10-CM | POA: Diagnosis not present

## 2020-08-07 DIAGNOSIS — M545 Low back pain: Secondary | ICD-10-CM | POA: Diagnosis not present

## 2020-08-07 LAB — URINALYSIS, ROUTINE W REFLEX MICROSCOPIC
Bilirubin Urine: NEGATIVE
Glucose, UA: >=500 mg/dL — AB
Ketones, ur: 20 mg/dL — AB
Nitrite: NEGATIVE
Protein, ur: 30 mg/dL — AB
RBC / HPF: >50 RBC/hpf — ABNORMAL HIGH (ref 0–5)
Specific Gravity, Urine: 1.024 (ref 1.005–1.030)
WBC, UA: >50 WBC/hpf — ABNORMAL HIGH (ref 0–5)
pH: 5 (ref 5.0–8.0)

## 2020-08-07 LAB — CBC WITH DIFFERENTIAL/PLATELET
Abs Immature Granulocytes: 0.21 10*3/uL — ABNORMAL HIGH (ref 0.00–0.07)
Basophils Absolute: 0.1 10*3/uL (ref 0.0–0.1)
Basophils Relative: 1 %
Eosinophils Absolute: 0 10*3/uL (ref 0.0–0.5)
Eosinophils Relative: 0 %
HCT: 44.5 % (ref 36.0–46.0)
Hemoglobin: 14.3 g/dL (ref 12.0–15.0)
Immature Granulocytes: 2 %
Lymphocytes Relative: 9 %
Lymphs Abs: 1.1 10*3/uL (ref 0.7–4.0)
MCH: 30.4 pg (ref 26.0–34.0)
MCHC: 32.1 g/dL (ref 30.0–36.0)
MCV: 94.5 fL (ref 80.0–100.0)
Monocytes Absolute: 0.9 10*3/uL (ref 0.1–1.0)
Monocytes Relative: 8 %
Neutro Abs: 9.1 10*3/uL — ABNORMAL HIGH (ref 1.7–7.7)
Neutrophils Relative %: 80 %
Platelets: 199 10*3/uL (ref 150–400)
RBC: 4.71 MIL/uL (ref 3.87–5.11)
RDW: 14 % (ref 11.5–15.5)
WBC: 11.3 10*3/uL — ABNORMAL HIGH (ref 4.0–10.5)
nRBC: 0 % (ref 0.0–0.2)

## 2020-08-07 LAB — COMPREHENSIVE METABOLIC PANEL
ALT: 17 U/L (ref 0–44)
AST: 13 U/L — ABNORMAL LOW (ref 15–41)
Albumin: 3.4 g/dL — ABNORMAL LOW (ref 3.5–5.0)
Alkaline Phosphatase: 61 U/L (ref 38–126)
Anion gap: 11 (ref 5–15)
BUN: 30 mg/dL — ABNORMAL HIGH (ref 8–23)
CO2: 23 mmol/L (ref 22–32)
Calcium: 9.5 mg/dL (ref 8.9–10.3)
Chloride: 101 mmol/L (ref 98–111)
Creatinine, Ser: 0.75 mg/dL (ref 0.44–1.00)
GFR calc Af Amer: >60 mL/min (ref >60)
GFR calc non Af Amer: >60 mL/min (ref >60)
Glucose, Bld: 274 mg/dL — ABNORMAL HIGH (ref 70–99)
Potassium: 4.3 mmol/L (ref 3.5–5.1)
Sodium: 135 mmol/L (ref 135–145)
Total Bilirubin: 1.5 mg/dL — ABNORMAL HIGH (ref 0.3–1.2)
Total Protein: 6.6 g/dL (ref 6.5–8.1)

## 2020-08-07 LAB — SARS CORONAVIRUS 2 BY RT PCR (HOSPITAL ORDER, PERFORMED IN ~~LOC~~ HOSPITAL LAB): SARS Coronavirus 2: NEGATIVE

## 2020-08-07 MED ORDER — OXYCODONE-ACETAMINOPHEN 5-325 MG PO TABS: 1.0000 | ORAL_TABLET | Freq: Four times a day (QID) | ORAL | 0 refills | Status: DC | PRN

## 2020-08-07 MED ORDER — CEPHALEXIN 500 MG PO CAPS: 500.0000 mg | ORAL_CAPSULE | Freq: Four times a day (QID) | ORAL | 0 refills | Status: DC

## 2020-08-07 MED ORDER — HYDROMORPHONE HCL 1 MG/ML IJ SOLN
1.0000 mg | Freq: Once | INTRAMUSCULAR | Status: AC
  Administered 2020-08-07: 1 mg via INTRAVENOUS
  Filled 2020-08-07: qty 1

## 2020-08-07 MED ORDER — SODIUM CHLORIDE 0.9 % IV SOLN
1.0000 g | Freq: Once | INTRAVENOUS | Status: AC
  Administered 2020-08-07: 1 g via INTRAVENOUS
  Filled 2020-08-07: qty 10

## 2020-08-07 MED ORDER — SODIUM CHLORIDE 0.9 % IV BOLUS
500.0000 mL | Freq: Once | INTRAVENOUS | Status: AC
  Administered 2020-08-07: 500 mL via INTRAVENOUS

## 2020-08-07 MED ORDER — HYDROCODONE-ACETAMINOPHEN 5-325 MG PO TABS
2.0000 | ORAL_TABLET | Freq: Once | ORAL | Status: AC
  Administered 2020-08-07: 2 via ORAL
  Filled 2020-08-07: qty 2

## 2020-08-07 MED ORDER — PREDNISONE 10 MG PO TABS: 20.0000 mg | ORAL_TABLET | Freq: Every day | ORAL | 0 refills | Status: DC

## 2020-08-07 MED ORDER — ONDANSETRON HCL 4 MG/2ML IJ SOLN
4.0000 mg | Freq: Once | INTRAMUSCULAR | Status: AC
  Administered 2020-08-07: 4 mg via INTRAVENOUS
  Filled 2020-08-07: qty 2

## 2020-08-07 MED ORDER — FENTANYL CITRATE (PF) 100 MCG/2ML IJ SOLN
50.0000 ug | Freq: Once | INTRAMUSCULAR | Status: AC
  Administered 2020-08-07: 50 ug via INTRAVENOUS
  Filled 2020-08-07: qty 2

## 2020-08-07 MED ORDER — METHYLPREDNISOLONE SODIUM SUCC 125 MG IJ SOLR
125.0000 mg | Freq: Once | INTRAMUSCULAR | Status: AC
  Administered 2020-08-07: 125 mg via INTRAVENOUS
  Filled 2020-08-07: qty 2

## 2020-08-07 NOTE — Discharge Instructions (Signed)
Follow-up with Dr. Riley Kill for urinary tract infection next week.  Call the neurosurgeons office Dr. Reatha Armour and make an appointment with that physician or one of his partners in the next couple weeks

## 2020-08-07 NOTE — ED Notes (Signed)
Return from MRI

## 2020-08-07 NOTE — ED Provider Notes (Signed)
Patient with back pain UTI and arthritis in her back.  No need for immediate neurosurgery consult.  Patient is sent home with Percocets prednisone and Keflex.  I have spoke with her daughter about following up with Dr. Wyatt Mage for the urinary tract infection and she has been referred to neurosurgery   Milton Ferguson, MD 08/07/20 1350

## 2020-08-07 NOTE — ED Provider Notes (Signed)
[facility] EMERGENCY DEPARTMENT Provider Note   CSN: [id number] Arrival date & time: 08/06/20  1653   Time seen 11:50 PM  History Chief Complaint  Patient presents with  . Back Pain    Susan Odom is a 74 y.o. female.  HPI   Patient states she has had low back pain that radiates up into her upper back between her shoulder blades for the past 2 weeks.  It gets worse with movement.  She states the pain will come and go and last about 30 minutes.  She states it was aching however the past week it has been "heat".  She has had some urinary and rectal incontinence "sometimes" for the past month.  She states the pain radiates into both of her legs down to her ankles.  She was seen last week by her primary care doctor and he is working on getting her referred to a spine specialist.  Nurses report patient arrived by EMS and she was put in the waiting room due to no bed availability.  She had urinated on herself and they put her in the triage area to get her cleaned up.  They had her stand up at which point her knees buckled and she went to the floor and they heard a large pop from her hip they believe on the right.  PCP Redmond School, MD   Past Medical History:  Diagnosis Date  . Diabetes mellitus   . Hypertension     Patient Active Problem List   Diagnosis Date Noted  . Blunt trauma of hip   . Class 1 obesity due to excess calories with body mass index (BMI) of 32.0 to 32.9 in adult   . Essential hypertension   . Rhabdomyolysis 07/28/2020  . Fall 07/28/2020  . Pressure ulcer 07/28/2020  . Generalized weakness 07/28/2020    Past Surgical History:  Procedure Laterality Date  . BREAST SURGERY       OB History   No obstetric history on file.     No family history on file.  Social History   Tobacco Use  . Smoking status: Never Smoker  . Smokeless tobacco: Never Used  Substance Use Topics  . Alcohol use: No  . Drug use: No  Lives at home Lives alone, states  her daughter has been staying with her recently due to her back pain Uses a walker and sometimes a cane  Home Medications Prior to Admission medications   Medication Sig Start Date End Date Taking? Authorizing Provider  acetaminophen (TYLENOL) 500 MG tablet Take 1,000 mg by mouth every 6 (six) hours as needed for pain.    [provider]  ALPRAZolam Duanne Moron) 1 MG tablet Take 1 mg by mouth 4 (four) times daily.    [provider]  amLODipine (NORVASC) 10 MG tablet Take 0.5 tablets (5 mg total) by mouth daily. 07/29/20   Barton Dubois, MD  canagliflozin (INVOKANA) 100 MG TABS tablet Take 100 mg by mouth daily before breakfast.    [provider]  gabapentin (NEURONTIN) 600 MG tablet Take 600 mg by mouth 3 (three) times daily. 07/25/20   [provider]  HYDROcodone-acetaminophen (NORCO) 10-325 MG tablet Take 1 tablet by mouth 3 (three) times daily as needed. 07/18/20   [provider]  IBU 800 MG tablet Take 800 mg by mouth every 8 (eight) hours as needed for pain. 07/24/20   [provider]  losartan (COZAAR) 100 MG tablet Take 100 mg by mouth  daily.    [provider]  methocarbamol (ROBAXIN) 500 MG tablet Take 1 tablet (500 mg total) by mouth every 12 (twelve) hours as needed for muscle spasms. 07/29/20   Barton Dubois, MD  Vitamin D, Ergocalciferol, (DRISDOL) 1.25 MG (50000 UNIT) CAPS capsule Take 50,000 Units by mouth once a week. 03/16/20   [provider]    Allergies    Patient has no known allergies.  Review of Systems   Review of Systems  All other systems reviewed and are negative.   Physical Exam Updated Vital Signs BP 132/70   Pulse 85   Temp 98.9 F (37.2 C) (Oral)   Resp 16   Ht 5\' 11"  (1.803 m)   Wt 99.8 kg   SpO2 91%   BMI 30.68 kg/m   Physical Exam Vitals and nursing note reviewed.  Constitutional:      General: She is not in acute distress.    Appearance: Normal appearance. She is obese.    HENT:     Head: Normocephalic and atraumatic.     Right Ear: External ear normal.     Left Ear: External ear normal.  Eyes:     Extraocular Movements: Extraocular movements intact.     Conjunctiva/sclera: Conjunctivae normal.  Cardiovascular:     Rate and Rhythm: Normal rate.  Pulmonary:     Effort: Pulmonary effort is normal. No respiratory distress.  Musculoskeletal:     Cervical back: Normal range of motion.     Right lower leg: Edema present.     Left lower leg: Edema present.     Comments: Patient has edema of both ankles which she states is chronic.  There is no shortening of her right leg, there is no internal or external rotation.  When I palpate her back she is nontender in the thoracic or lumbar spine but is very tender over the sacrum.  Patient is extremely painful on any movement and has to be assisted by nursing staff.  Unable to do any further exam on her back.  Patient is noted to be incontinent of urine and stool.  Skin:    General: Skin is warm and dry.  Neurological:     General: No focal deficit present.     Mental Status: She is alert and oriented to person, place, and time.     Cranial Nerves: Cranial nerve deficit present.  Psychiatric:        Mood and Affect: Mood normal.        Behavior: Behavior normal.        Thought Content: Thought content normal.     ED Results / Procedures / Treatments   Labs (all labs ordered are listed, but only abnormal results are displayed) Labs Reviewed  CBC WITH DIFFERENTIAL/PLATELET  COMPREHENSIVE METABOLIC PANEL  URINALYSIS, ROUTINE W REFLEX MICROSCOPIC    EKG None  Radiology DG HIP UNILAT WITH PELVIS 2-3 VIEWS RIGHT  Result Date: 08/07/2020 CLINICAL DATA:  Status post fall with subsequent right hip pain. EXAM: DG HIP (WITH OR WITHOUT PELVIS) 2-3V RIGHT COMPARISON:  July 28, 2020 FINDINGS: There is no evidence of an acute hip fracture or dislocation. The mild to moderate severity degenerative changes seen  involving both hips. Radiopaque surgical clips are seen overlying the lower pelvis. IMPRESSION: No acute osseous abnormality. Electronically Signed   By: Virgina Norfolk M.D.   On: 08/07/2020 00:58    Procedures Procedures (including critical care time)  Medications Ordered in ED Medications  sodium chloride  0.9 % bolus 500 mL (has no administration in time range)  fentaNYL (SUBLIMAZE) injection 50 mcg (50 mcg Intravenous Given 08/07/20 0014)  HYDROcodone-acetaminophen (NORCO/VICODIN) 5-325 MG per tablet 2 tablet (2 tablets Oral Given 08/07/20 8416)    ED Course  I have reviewed the triage vital signs and the nursing notes.  Pertinent labs & imaging results that were available during my care of the patient were reviewed by me and considered in my medical decision making (see chart for details).    MDM Rules/Calculators/A&P                          Review of her chart shows she was seen in the ED on September 4 with complaints of back pain and right hip pain.  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  She also had CT of the abdomen/pelvis and CT views of her right hip that did not show any acute changes.  Patient was given fentanyl IV for pain, x-rays of her right hip were redone.  Patient continued to complain of pain and she was given hydrocodone 10 mg / 650 mg orally.  After reviewing her x-rays which did not show any acute changes patient was scheduled to have MRI of her lumbar spine in the morning. Dr Roderic Palau will check her MRI results. If no acute findings, may need to go to a facility until she is able to be mobile again.   Review of the [place] shows patient got #120 hydrocodone 10/325 on August 25 by her PCP.  She received 40 tablets on August 14.  Prior to that she had gotten #180 oxycodone 10/325's in 2019 through March 2020.  Final Clinical Impression(s) / ED Diagnoses Final diagnoses:    Pain  Fall  Acute midline low back pain with bilateral sciatica    Rx / DC Orders  Disposition pending  Rolland Porter, MD, Barbette Or, MD 08/07/20 [phone]

## 2020-08-07 NOTE — ED Notes (Signed)
Pt was unable to get into vehicle when wheeled out to truck.  EMS transported pt.

## 2020-08-07 NOTE — ED Notes (Signed)
Pt incontinent of urine and stool.  Cleaned pt up.  Peri and sacral area red.  Applied moisture barrier and turned pt on side.

## 2020-08-08 LAB — URINE CULTURE

## 2020-08-09 ENCOUNTER — Other Ambulatory Visit (HOSPITAL_COMMUNITY): Payer: Self-pay | Admitting: Internal Medicine

## 2020-08-09 DIAGNOSIS — S79911A Unspecified injury of right hip, initial encounter: Secondary | ICD-10-CM | POA: Diagnosis not present

## 2020-08-09 DIAGNOSIS — I2510 Atherosclerotic heart disease of native coronary artery without angina pectoris: Secondary | ICD-10-CM | POA: Diagnosis not present

## 2020-08-09 DIAGNOSIS — M47816 Spondylosis without myelopathy or radiculopathy, lumbar region: Secondary | ICD-10-CM | POA: Diagnosis not present

## 2020-08-09 DIAGNOSIS — G8220 Paraplegia, unspecified: Secondary | ICD-10-CM | POA: Diagnosis not present

## 2020-08-09 DIAGNOSIS — M25551 Pain in right hip: Secondary | ICD-10-CM

## 2020-08-09 DIAGNOSIS — S72491D Other fracture of lower end of right femur, subsequent encounter for closed fracture with routine healing: Secondary | ICD-10-CM | POA: Diagnosis not present

## 2020-08-09 DIAGNOSIS — T796XXD Traumatic ischemia of muscle, subsequent encounter: Secondary | ICD-10-CM | POA: Diagnosis not present

## 2020-08-09 DIAGNOSIS — S8991XA Unspecified injury of right lower leg, initial encounter: Secondary | ICD-10-CM | POA: Diagnosis not present

## 2020-08-09 DIAGNOSIS — L89152 Pressure ulcer of sacral region, stage 2: Secondary | ICD-10-CM | POA: Diagnosis not present

## 2020-08-09 DIAGNOSIS — I10 Essential (primary) hypertension: Secondary | ICD-10-CM | POA: Diagnosis not present

## 2020-08-09 DIAGNOSIS — M79604 Pain in right leg: Secondary | ICD-10-CM

## 2020-08-10 ENCOUNTER — Inpatient Hospital Stay (HOSPITAL_COMMUNITY): Payer: Medicare Other

## 2020-08-10 ENCOUNTER — Emergency Department (HOSPITAL_COMMUNITY): Payer: Medicare Other

## 2020-08-10 ENCOUNTER — Inpatient Hospital Stay (HOSPITAL_COMMUNITY)
Admission: EM | Admit: 2020-08-10 | Discharge: 2020-08-21 | DRG: 025 | Disposition: A | Discharge status: Skilled Nursing Facility | Payer: Medicare Other | Source: Ambulatory Visit | Attending: Internal Medicine | Admitting: Internal Medicine

## 2020-08-10 ENCOUNTER — Other Ambulatory Visit: Payer: Self-pay

## 2020-08-10 DIAGNOSIS — C711 Malignant neoplasm of frontal lobe: Principal | ICD-10-CM | POA: Diagnosis present

## 2020-08-10 DIAGNOSIS — M25561 Pain in right knee: Secondary | ICD-10-CM | POA: Diagnosis not present

## 2020-08-10 DIAGNOSIS — E876 Hypokalemia: Secondary | ICD-10-CM | POA: Diagnosis not present

## 2020-08-10 DIAGNOSIS — E118 Type 2 diabetes mellitus with unspecified complications: Secondary | ICD-10-CM | POA: Diagnosis not present

## 2020-08-10 DIAGNOSIS — S72401D Unspecified fracture of lower end of right femur, subsequent encounter for closed fracture with routine healing: Secondary | ICD-10-CM | POA: Diagnosis not present

## 2020-08-10 DIAGNOSIS — Z66 Do not resuscitate: Secondary | ICD-10-CM | POA: Diagnosis not present

## 2020-08-10 DIAGNOSIS — M6258 Muscle wasting and atrophy, not elsewhere classified, other site: Secondary | ICD-10-CM | POA: Diagnosis not present

## 2020-08-10 DIAGNOSIS — S72401A Unspecified fracture of lower end of right femur, initial encounter for closed fracture: Secondary | ICD-10-CM | POA: Diagnosis not present

## 2020-08-10 DIAGNOSIS — S72491A Other fracture of lower end of right femur, initial encounter for closed fracture: Secondary | ICD-10-CM

## 2020-08-10 DIAGNOSIS — Z6832 Body mass index (BMI) 32.0-32.9, adult: Secondary | ICD-10-CM | POA: Diagnosis not present

## 2020-08-10 DIAGNOSIS — T380X5A Adverse effect of glucocorticoids and synthetic analogues, initial encounter: Secondary | ICD-10-CM | POA: Diagnosis not present

## 2020-08-10 DIAGNOSIS — Z419 Encounter for procedure for purposes other than remedying health state, unspecified: Secondary | ICD-10-CM

## 2020-08-10 DIAGNOSIS — R296 Repeated falls: Secondary | ICD-10-CM | POA: Diagnosis not present

## 2020-08-10 DIAGNOSIS — M25559 Pain in unspecified hip: Secondary | ICD-10-CM

## 2020-08-10 DIAGNOSIS — S72331A Displaced oblique fracture of shaft of right femur, initial encounter for closed fracture: Secondary | ICD-10-CM | POA: Diagnosis present

## 2020-08-10 DIAGNOSIS — S72001A Fracture of unspecified part of neck of right femur, initial encounter for closed fracture: Secondary | ICD-10-CM | POA: Diagnosis not present

## 2020-08-10 DIAGNOSIS — T796XXD Traumatic ischemia of muscle, subsequent encounter: Secondary | ICD-10-CM | POA: Diagnosis not present

## 2020-08-10 DIAGNOSIS — S72041D Displaced fracture of base of neck of right femur, subsequent encounter for closed fracture with routine healing: Secondary | ICD-10-CM | POA: Diagnosis not present

## 2020-08-10 DIAGNOSIS — S7290XA Unspecified fracture of unspecified femur, initial encounter for closed fracture: Secondary | ICD-10-CM

## 2020-08-10 DIAGNOSIS — R2689 Other abnormalities of gait and mobility: Secondary | ICD-10-CM | POA: Diagnosis not present

## 2020-08-10 DIAGNOSIS — G9389 Other specified disorders of brain: Secondary | ICD-10-CM

## 2020-08-10 DIAGNOSIS — E669 Obesity, unspecified: Secondary | ICD-10-CM | POA: Diagnosis present

## 2020-08-10 DIAGNOSIS — L89312 Pressure ulcer of right buttock, stage 2: Secondary | ICD-10-CM | POA: Diagnosis not present

## 2020-08-10 DIAGNOSIS — R1312 Dysphagia, oropharyngeal phase: Secondary | ICD-10-CM | POA: Diagnosis not present

## 2020-08-10 DIAGNOSIS — E1140 Type 2 diabetes mellitus with diabetic neuropathy, unspecified: Secondary | ICD-10-CM | POA: Diagnosis not present

## 2020-08-10 DIAGNOSIS — R930 Abnormal findings on diagnostic imaging of skull and head, not elsewhere classified: Secondary | ICD-10-CM | POA: Diagnosis not present

## 2020-08-10 DIAGNOSIS — Z7189 Other specified counseling: Secondary | ICD-10-CM

## 2020-08-10 DIAGNOSIS — M2550 Pain in unspecified joint: Secondary | ICD-10-CM | POA: Diagnosis not present

## 2020-08-10 DIAGNOSIS — Z7401 Bed confinement status: Secondary | ICD-10-CM | POA: Diagnosis not present

## 2020-08-10 DIAGNOSIS — R0902 Hypoxemia: Secondary | ICD-10-CM | POA: Diagnosis not present

## 2020-08-10 DIAGNOSIS — N390 Urinary tract infection, site not specified: Secondary | ICD-10-CM | POA: Diagnosis present

## 2020-08-10 DIAGNOSIS — M19071 Primary osteoarthritis, right ankle and foot: Secondary | ICD-10-CM | POA: Diagnosis not present

## 2020-08-10 DIAGNOSIS — S7291XA Unspecified fracture of right femur, initial encounter for closed fracture: Secondary | ICD-10-CM | POA: Diagnosis not present

## 2020-08-10 DIAGNOSIS — D72829 Elevated white blood cell count, unspecified: Secondary | ICD-10-CM | POA: Diagnosis present

## 2020-08-10 DIAGNOSIS — M79606 Pain in leg, unspecified: Secondary | ICD-10-CM

## 2020-08-10 DIAGNOSIS — M25572 Pain in left ankle and joints of left foot: Secondary | ICD-10-CM | POA: Diagnosis not present

## 2020-08-10 DIAGNOSIS — I10 Essential (primary) hypertension: Secondary | ICD-10-CM | POA: Diagnosis present

## 2020-08-10 DIAGNOSIS — R404 Transient alteration of awareness: Secondary | ICD-10-CM | POA: Diagnosis not present

## 2020-08-10 DIAGNOSIS — M1711 Unilateral primary osteoarthritis, right knee: Secondary | ICD-10-CM | POA: Diagnosis not present

## 2020-08-10 DIAGNOSIS — D430 Neoplasm of uncertain behavior of brain, supratentorial: Secondary | ICD-10-CM | POA: Diagnosis not present

## 2020-08-10 DIAGNOSIS — M25551 Pain in right hip: Secondary | ICD-10-CM | POA: Diagnosis not present

## 2020-08-10 DIAGNOSIS — E1165 Type 2 diabetes mellitus with hyperglycemia: Secondary | ICD-10-CM | POA: Diagnosis present

## 2020-08-10 DIAGNOSIS — I6782 Cerebral ischemia: Secondary | ICD-10-CM | POA: Diagnosis not present

## 2020-08-10 DIAGNOSIS — I6529 Occlusion and stenosis of unspecified carotid artery: Secondary | ICD-10-CM | POA: Diagnosis not present

## 2020-08-10 DIAGNOSIS — S7292XA Unspecified fracture of left femur, initial encounter for closed fracture: Secondary | ICD-10-CM | POA: Diagnosis not present

## 2020-08-10 DIAGNOSIS — E119 Type 2 diabetes mellitus without complications: Secondary | ICD-10-CM | POA: Diagnosis not present

## 2020-08-10 DIAGNOSIS — M7989 Other specified soft tissue disorders: Secondary | ICD-10-CM | POA: Diagnosis not present

## 2020-08-10 DIAGNOSIS — M6281 Muscle weakness (generalized): Secondary | ICD-10-CM | POA: Diagnosis not present

## 2020-08-10 DIAGNOSIS — G939 Disorder of brain, unspecified: Secondary | ICD-10-CM | POA: Diagnosis not present

## 2020-08-10 DIAGNOSIS — D649 Anemia, unspecified: Secondary | ICD-10-CM | POA: Diagnosis not present

## 2020-08-10 DIAGNOSIS — Z20822 Contact with and (suspected) exposure to covid-19: Secondary | ICD-10-CM | POA: Diagnosis present

## 2020-08-10 DIAGNOSIS — S72351A Displaced comminuted fracture of shaft of right femur, initial encounter for closed fracture: Secondary | ICD-10-CM | POA: Diagnosis not present

## 2020-08-10 DIAGNOSIS — L8990 Pressure ulcer of unspecified site, unspecified stage: Secondary | ICD-10-CM | POA: Diagnosis present

## 2020-08-10 DIAGNOSIS — W19XXXA Unspecified fall, initial encounter: Secondary | ICD-10-CM | POA: Diagnosis not present

## 2020-08-10 DIAGNOSIS — I2510 Atherosclerotic heart disease of native coronary artery without angina pectoris: Secondary | ICD-10-CM | POA: Diagnosis not present

## 2020-08-10 DIAGNOSIS — Z515 Encounter for palliative care: Secondary | ICD-10-CM

## 2020-08-10 DIAGNOSIS — Z743 Need for continuous supervision: Secondary | ICD-10-CM | POA: Diagnosis not present

## 2020-08-10 LAB — COMPREHENSIVE METABOLIC PANEL
ALT: 16 U/L (ref 0–44)
AST: 16 U/L (ref 15–41)
Albumin: 3.3 g/dL — ABNORMAL LOW (ref 3.5–5.0)
Alkaline Phosphatase: 75 U/L (ref 38–126)
Anion gap: 13 (ref 5–15)
BUN: 26 mg/dL — ABNORMAL HIGH (ref 8–23)
CO2: 24 mmol/L (ref 22–32)
Calcium: 10 mg/dL (ref 8.9–10.3)
Chloride: 97 mmol/L — ABNORMAL LOW (ref 98–111)
Creatinine, Ser: 0.78 mg/dL (ref 0.44–1.00)
GFR calc Af Amer: >60 mL/min (ref >60)
GFR calc non Af Amer: >60 mL/min (ref >60)
Glucose, Bld: 270 mg/dL — ABNORMAL HIGH (ref 70–99)
Potassium: 4.4 mmol/L (ref 3.5–5.1)
Sodium: 134 mmol/L — ABNORMAL LOW (ref 135–145)
Total Bilirubin: 1.4 mg/dL — ABNORMAL HIGH (ref 0.3–1.2)
Total Protein: 6.8 g/dL (ref 6.5–8.1)

## 2020-08-10 LAB — HEMOGLOBIN A1C
Hgb A1c MFr Bld: 7.3 % — ABNORMAL HIGH (ref 4.8–5.6)
Mean Plasma Glucose: 162.81 mg/dL

## 2020-08-10 LAB — URINALYSIS, ROUTINE W REFLEX MICROSCOPIC
Bilirubin Urine: NEGATIVE
Glucose, UA: >=500 mg/dL — AB
Ketones, ur: 20 mg/dL — AB
Nitrite: NEGATIVE
Protein, ur: NEGATIVE mg/dL
RBC / HPF: >50 RBC/hpf — ABNORMAL HIGH (ref 0–5)
Specific Gravity, Urine: 1.028 (ref 1.005–1.030)
WBC, UA: >50 WBC/hpf — ABNORMAL HIGH (ref 0–5)
pH: 5 (ref 5.0–8.0)

## 2020-08-10 LAB — CBC WITH DIFFERENTIAL/PLATELET
Abs Immature Granulocytes: 0.59 10*3/uL — ABNORMAL HIGH (ref 0.00–0.07)
Basophils Absolute: 0.1 10*3/uL (ref 0.0–0.1)
Basophils Relative: 1 %
Eosinophils Absolute: 0 10*3/uL (ref 0.0–0.5)
Eosinophils Relative: 0 %
HCT: 42.6 % (ref 36.0–46.0)
Hemoglobin: 13.7 g/dL (ref 12.0–15.0)
Immature Granulocytes: 4 %
Lymphocytes Relative: 6 %
Lymphs Abs: 0.9 10*3/uL (ref 0.7–4.0)
MCH: 29.8 pg (ref 26.0–34.0)
MCHC: 32.2 g/dL (ref 30.0–36.0)
MCV: 92.6 fL (ref 80.0–100.0)
Monocytes Absolute: 0.7 10*3/uL (ref 0.1–1.0)
Monocytes Relative: 5 %
Neutro Abs: 11.7 10*3/uL — ABNORMAL HIGH (ref 1.7–7.7)
Neutrophils Relative %: 84 %
Platelets: 218 10*3/uL (ref 150–400)
RBC: 4.6 MIL/uL (ref 3.87–5.11)
RDW: 13.7 % (ref 11.5–15.5)
WBC: 14 10*3/uL — ABNORMAL HIGH (ref 4.0–10.5)
nRBC: 0.1 % (ref 0.0–0.2)

## 2020-08-10 LAB — SEDIMENTATION RATE: Sed Rate: 19 mm/hr (ref 0–22)

## 2020-08-10 LAB — MAGNESIUM: Magnesium: 2 mg/dL (ref 1.7–2.4)

## 2020-08-10 LAB — TROPONIN I (HIGH SENSITIVITY)
Troponin I (High Sensitivity): 6 ng/L (ref <18)
Troponin I (High Sensitivity): 6 ng/L (ref <18)

## 2020-08-10 LAB — C-REACTIVE PROTEIN: CRP: 5.4 mg/dL — ABNORMAL HIGH (ref <1.0)

## 2020-08-10 LAB — GLUCOSE, CAPILLARY: Glucose-Capillary: 206 mg/dL — ABNORMAL HIGH (ref 70–99)

## 2020-08-10 LAB — SARS CORONAVIRUS 2 BY RT PCR (HOSPITAL ORDER, PERFORMED IN ~~LOC~~ HOSPITAL LAB): SARS Coronavirus 2: NEGATIVE

## 2020-08-10 MED ORDER — FENTANYL CITRATE (PF) 100 MCG/2ML IJ SOLN
50.0000 ug | Freq: Once | INTRAMUSCULAR | Status: AC
  Administered 2020-08-10: 50 ug via INTRAVENOUS
  Filled 2020-08-10: qty 2

## 2020-08-10 MED ORDER — INSULIN ASPART 100 UNIT/ML ~~LOC~~ SOLN
0.0000 [IU] | Freq: Every day | SUBCUTANEOUS | Status: DC
  Administered 2020-08-10 – 2020-08-15 (×4): 2 [IU] via SUBCUTANEOUS
  Administered 2020-08-16: 3 [IU] via SUBCUTANEOUS
  Administered 2020-08-17: 4 [IU] via SUBCUTANEOUS
  Administered 2020-08-20: 3 [IU] via SUBCUTANEOUS

## 2020-08-10 MED ORDER — CHLORHEXIDINE GLUCONATE 4 % EX LIQD: 60.0000 mL | Freq: Once | CUTANEOUS | Status: DC

## 2020-08-10 MED ORDER — ACETAMINOPHEN 650 MG RE SUPP: 650.0000 mg | Freq: Four times a day (QID) | RECTAL | Status: DC | PRN

## 2020-08-10 MED ORDER — POVIDONE-IODINE 10 % EX SWAB: 2.0000 "application " | Freq: Once | CUTANEOUS | Status: DC

## 2020-08-10 MED ORDER — GADOBUTROL 1 MMOL/ML IV SOLN
10.0000 mL | Freq: Once | INTRAVENOUS | Status: AC | PRN
  Administered 2020-08-10: 10 mL via INTRAVENOUS

## 2020-08-10 MED ORDER — HYDRALAZINE HCL 20 MG/ML IJ SOLN: 10.0000 mg | Freq: Four times a day (QID) | INTRAMUSCULAR | Status: DC | PRN

## 2020-08-10 MED ORDER — OXYCODONE HCL 5 MG PO TABS
5.0000 mg | ORAL_TABLET | ORAL | Status: DC | PRN
  Administered 2020-08-10 – 2020-08-21 (×21): 5 mg via ORAL
  Filled 2020-08-10 (×21): qty 1

## 2020-08-10 MED ORDER — ACETAMINOPHEN 325 MG PO TABS: 650.0000 mg | ORAL_TABLET | Freq: Four times a day (QID) | ORAL | Status: DC | PRN

## 2020-08-10 MED ORDER — FENTANYL CITRATE (PF) 100 MCG/2ML IJ SOLN
12.5000 ug | INTRAMUSCULAR | Status: DC | PRN
  Administered 2020-08-11: 100 ug via INTRAVENOUS
  Administered 2020-08-11: 50 ug via INTRAVENOUS

## 2020-08-10 MED ORDER — CEFAZOLIN SODIUM-DEXTROSE 2-4 GM/100ML-% IV SOLN
2.0000 g | INTRAVENOUS | Status: AC
  Administered 2020-08-11: 2 g via INTRAVENOUS
  Filled 2020-08-10: qty 100

## 2020-08-10 MED ORDER — ONDANSETRON HCL 4 MG/2ML IJ SOLN: 4.0000 mg | Freq: Four times a day (QID) | INTRAMUSCULAR | Status: DC | PRN

## 2020-08-10 MED ORDER — SODIUM CHLORIDE 0.9 % IV SOLN
1.0000 g | Freq: Once | INTRAVENOUS | Status: AC
  Administered 2020-08-10: 1 g via INTRAVENOUS
  Filled 2020-08-10: qty 10

## 2020-08-10 MED ORDER — ONDANSETRON HCL 4 MG PO TABS: 4.0000 mg | ORAL_TABLET | Freq: Four times a day (QID) | ORAL | Status: DC | PRN

## 2020-08-10 MED ORDER — SODIUM CHLORIDE 0.9 % IV BOLUS
500.0000 mL | Freq: Once | INTRAVENOUS | Status: AC
  Administered 2020-08-10: 500 mL via INTRAVENOUS

## 2020-08-10 MED ORDER — SODIUM CHLORIDE 0.9 % IV SOLN
1.0000 g | INTRAVENOUS | Status: AC
  Administered 2020-08-11 – 2020-08-13 (×3): 1 g via INTRAVENOUS
  Filled 2020-08-10 (×3): qty 10

## 2020-08-10 MED ORDER — INSULIN ASPART 100 UNIT/ML ~~LOC~~ SOLN
0.0000 [IU] | Freq: Three times a day (TID) | SUBCUTANEOUS | Status: DC
  Administered 2020-08-11 – 2020-08-12 (×2): 3 [IU] via SUBCUTANEOUS
  Administered 2020-08-12: 5 [IU] via SUBCUTANEOUS
  Administered 2020-08-12: 8 [IU] via SUBCUTANEOUS
  Administered 2020-08-13 – 2020-08-15 (×8): 3 [IU] via SUBCUTANEOUS
  Administered 2020-08-16: 5 [IU] via SUBCUTANEOUS
  Administered 2020-08-16 (×2): 8 [IU] via SUBCUTANEOUS
  Administered 2020-08-17: 5 [IU] via SUBCUTANEOUS
  Administered 2020-08-17: 8 [IU] via SUBCUTANEOUS
  Administered 2020-08-17 – 2020-08-18 (×2): 5 [IU] via SUBCUTANEOUS
  Administered 2020-08-18: 11 [IU] via SUBCUTANEOUS
  Administered 2020-08-18 – 2020-08-19 (×2): 5 [IU] via SUBCUTANEOUS
  Administered 2020-08-19: 3 [IU] via SUBCUTANEOUS
  Administered 2020-08-19: 11 [IU] via SUBCUTANEOUS
  Administered 2020-08-20: 5 [IU] via SUBCUTANEOUS
  Administered 2020-08-20: 3 [IU] via SUBCUTANEOUS
  Administered 2020-08-20: 11 [IU] via SUBCUTANEOUS
  Administered 2020-08-21 (×2): 8 [IU] via SUBCUTANEOUS
  Administered 2020-08-21: 11 [IU] via SUBCUTANEOUS

## 2020-08-10 NOTE — ED Notes (Signed)
Attending at bedside.

## 2020-08-10 NOTE — Consult Note (Signed)
Reason for Consult: Mental status change Referring Physician: [name] is an 74 y.o. female.  HPI: [name] is a 74 year old individual who presented to the emergency department after being seen in my office earlier this morning.  She is referred via Dr. Asencion Noble for difficulties with back pain and inability to walk for 2 weeks time.  Over the last 2 weeks she has had 2 visits to the emergency department at [facility].  When she presented to the emergency room they did a work-up including a CT scan of the lower lumbar spine and the abdomen and pelvis looking for some hip pathology none we'll was particularly noted an MRI of the lumbar spine then demonstrated that she had minimal spondylitic changes in the lower lumbar spine.  Patient has not been able to walk for 2 weeks time and in our office I noted that she had a severe valgus deformity of the right knee.  She could not stand to bear weight and was extremely uncomfortable and pain we are not able to obtain a radiograph in our office because she was not able to be moved from the chair and I advised that she be transferred to the emergency department for further work-up.  While in the ED a CT scan of the brain was performed as her daughter notes that she has had some mental status changes the scan of the brain demonstrates that there is a lesion in the left frontal region which extends to the corpus callosum that is poorly characterized on a noncontrast CT of the head.  The patient will require an MRI of the brain.  As regards the right knee she appears to have a distal femur fracture.  This is being evaluated by orthopedics.  Past Medical History:  Diagnosis Date  . Diabetes mellitus   . Hypertension     Past Surgical History:  Procedure Laterality Date  . BREAST SURGERY      No family history on file.  Social History:  reports that she has never smoked. She has never used smokeless tobacco. She reports  that she does not drink alcohol and does not use drugs.  Allergies: No Known Allergies  Medications: I have reviewed the patient's current medications.  Results for orders placed or performed during the hospital encounter of 08/10/20 (from the past 48 hour(s))  CBC with Differential     Status: Abnormal   Collection Time: 08/10/20  2:05 PM  Result Value Ref Range   WBC 14.0 (H) 4.0 - 10.5 K/uL   RBC 4.60 3.87 - 5.11 MIL/uL   Hemoglobin 13.7 12.0 - 15.0 g/dL   HCT 42.6 36 - 46 %   MCV 92.6 80.0 - 100.0 fL   MCH 29.8 26.0 - 34.0 pg   MCHC 32.2 30.0 - 36.0 g/dL   RDW 13.7 11.5 - 15.5 %   Platelets 218 150 - 400 K/uL   nRBC 0.1 0.0 - 0.2 %   Neutrophils Relative % 84 %   Neutro Abs 11.7 (H) 1.7 - 7.7 K/uL   Lymphocytes Relative 6 %   Lymphs Abs 0.9 0.7 - 4.0 K/uL   Monocytes Relative 5 %   Monocytes Absolute 0.7 0 - 1 K/uL   Eosinophils Relative 0 %   Eosinophils Absolute 0.0 0 - 0 K/uL   Basophils Relative 1 %   Basophils Absolute 0.1 0 - 0 K/uL   Immature Granulocytes 4 %   Abs Immature Granulocytes  0.59 (H) 0.00 - 0.07 K/uL    Comment: Performed at [facility], [place] [address], [place], [place] [address]  Comprehensive metabolic panel     Status: Abnormal   Collection Time: 08/10/20  2:05 PM  Result Value Ref Range   Sodium 134 (L) 135 - 145 mmol/L   Potassium 4.4 3.5 - 5.1 mmol/L   Chloride 97 (L) 98 - 111 mmol/L   CO2 24 22 - 32 mmol/L   Glucose, Bld 270 (H) 70 - 99 mg/dL    Comment: Glucose reference range applies only to samples taken after fasting for at least 8 hours.   BUN 26 (H) 8 - 23 mg/dL   Creatinine, Ser 0.78 0.44 - 1.00 mg/dL   Calcium 10.0 8.9 - 10.3 mg/dL   Total Protein 6.8 6.5 - 8.1 g/dL   Albumin 3.3 (L) 3.5 - 5.0 g/dL   AST 16 15 - 41 U/L   ALT 16 0 - 44 U/L   Alkaline Phosphatase 75 38 - 126 U/L   Total Bilirubin 1.4 (H) 0.3 - 1.2 mg/dL   GFR calc non Af Amer >60 >60 mL/min   GFR calc Af Amer >60 >60 mL/min   Anion gap 13 5 - 15     Comment: Performed at [facility] [address], [place], [place] [address]  Sedimentation rate     Status: None   Collection Time: 08/10/20  2:05 PM  Result Value Ref Range   Sed Rate 19 0 - 22 mm/hr    Comment: Performed at [facility] [address], [place], [place] [address]  C-reactive protein     Status: Abnormal   Collection Time: 08/10/20  2:05 PM  Result Value Ref Range   CRP 5.4 (H) <1.0 mg/dL    Comment: Performed at [facility], [place] [address], [place], [place] [address]  SARS Coronavirus 2 by RT PCR (hospital order, performed in [facility] hospital lab) Nasopharyngeal Nasopharyngeal Swab     Status: None   Collection Time: 08/10/20  2:33 PM   Specimen: Nasopharyngeal Swab  Result Value Ref Range   SARS Coronavirus 2 NEGATIVE NEGATIVE    Comment: (NOTE) SARS-CoV-2 target nucleic acids are NOT DETECTED.  The SARS-CoV-2 RNA is generally detectable in upper and lower respiratory specimens during the acute phase of infection. The lowest concentration of SARS-CoV-2 viral copies this assay can detect is 250 copies / mL. A negative result does not preclude SARS-CoV-2 infection and should not be used as the sole basis for treatment or other patient management decisions.  A negative result may occur with improper specimen collection / handling, submission of specimen other than nasopharyngeal swab, presence of viral mutation(s) within the areas targeted by this assay, and inadequate number of viral copies (<250 copies / mL). A negative result must be combined with clinical observations, patient history, and epidemiological information.  Fact Sheet for Patients:   StrictlyIdeas.no  Fact Sheet for Healthcare Providers: BankingDealers.co.za  This test is not yet approved or  cleared by the [place] FDA and has been authorized for detection and/or diagnosis of SARS-CoV-2 by FDA under an Emergency Use  Authorization (EUA).  This EUA will remain in effect (meaning this test can be used) for the duration of the COVID-19 declaration under Section 564(b)(1) of the Act, 21 U.S.C. section 360bbb-3(b)(1), unless the authorization is terminated or revoked sooner.  Performed at [facility], [place],  [place] [address]   Magnesium     Status: None   Collection Time: 08/10/20  4:16 PM  Result Value Ref Range   Magnesium 2.0 1.7 - 2.4 mg/dL    Comment: Performed at [facility], [place] [address], [place], [place] [address]  Troponin I (High Sensitivity)     Status: None   Collection Time: 08/10/20  4:16 PM  Result Value Ref Range   Troponin I (High Sensitivity) 6 <18 ng/L    Comment: (NOTE) Elevated high sensitivity troponin I (hsTnI) values and significant  changes across serial measurements may suggest ACS but many other  chronic and acute conditions are known to elevate hsTnI results.  Refer to the "Links" section for chest pain algorithms and additional  guidance. Performed at [facility], [place] [address], [place], [place] [address]     DG Knee 1-2 Views Right  Result Date: 08/10/2020 CLINICAL DATA:  Right knee pain after multiple falls. EXAM: RIGHT KNEE - 1-2 VIEW COMPARISON:  None. FINDINGS: Severely displaced and comminuted oblique fracture is seen involving the distal right femur. There is slight overriding of the fracture fragments. Degenerative changes are seen involving the right knee. IMPRESSION: Severely displaced and comminuted oblique fracture of distal right femur. Electronically Signed   By: Marijo Conception M.D.   On: 08/10/2020 13:24   CT Head Wo Contrast  Result Date: 08/10/2020 CLINICAL DATA:  Mental status change, history of falls EXAM: CT HEAD WITHOUT CONTRAST TECHNIQUE: Contiguous axial images were obtained from the base of the skull through the vertex without intravenous contrast. COMPARISON:  CT 10/17/2013 FINDINGS: Brain: Ill-defined  slightly hyperattenuating lobular masslike lesions about the corpus callosum,, interventricular septum and fornix with some hypoattenuating, likely vasogenic edema crossing genu of the corpus callosum and deep white matter adjacent the left lateral ventricle as well. Overall, incompletely characterized on the CT imaging. No areas of hyperdense hemorrhage. Some local mass effect and partial effacement of the lateral ventricles is noted. No extra-axial collection or hemorrhage is evident. Chronic mild expansion of the retro cerebellar CSF space likely reflecting arachnoid cyst versus mega cisterna magna. Symmetric prominence of the ventricles, cisterns and sulci compatible with parenchymal volume loss. Chronic small vessel ischemic changes are similar to priors. Vascular: Atherosclerotic calcification of the carotid siphons and intradural vertebral arteries. No hyperdense vessel. Skull: No calvarial fracture or suspicious osseous lesion. No scalp swelling or hematoma. Sinuses/Orbits: Paranasal sinuses and mastoid air cells are predominantly clear. Included orbital structures are unremarkable. Other: None IMPRESSION: Lobular masslike lesion centered predominantly along the corpus callosum, interventricular septum and fornix with likely surrounding vasogenic edema which crosses the genu of the corpus callosum and is also present the left frontal white matter. Primary differential considerations would include a intracranial neoplasm such as CNS lymphoma or glioblastoma multiforme. Overall, incompletely characterized on this unenhanced head CT. Recommend further evaluation with MRI with contrast. No other acute intracranial abnormality. Background of some mild chronic parenchymal volume loss and microvascular angiopathy changes with intracranial atherosclerosis. These results were called by telephone at the time of interpretation on 08/10/2020 at 4:01 pm to provider [facility] , who verbally acknowledged these  results. Electronically Signed   By: Lovena Le M.D.   On: 08/10/2020 16:01   CT Knee Right Wo Contrast  Result Date: 08/10/2020 CLINICAL DATA:  Frequent falls.  Distal femur fracture. EXAM: CT OF THE RIGHT KNEE WITHOUT CONTRAST TECHNIQUE: Multidetector CT imaging of the right knee was performed according to  the standard protocol. Multiplanar CT image reconstructions were also generated. COMPARISON:  Radiographs same date. FINDINGS: Bones/Joint/Cartilage Oblique fracture of the distal femoral metadiaphysis is again noted with mild comminution. This fracture is associated with up to 4.3 cm of medial displacement as well as significant overriding of the fracture fragments. There is no intra-articular extension of the fracture. The patella, proximal tibia and proximal fibula are intact. There are advanced tricompartmental degenerative changes at the knee with large osteophytes and intra-articular loose bodies. There is a small joint effusion. Ligaments Suboptimally assessed by CT. The anterior cruciate ligament is not well visualized. Muscles and Tendons The extensor mechanism is intact. Mild generalized muscular atrophy. Soft tissues Small hematoma adjacent to the fracture. No foreign body, soft tissue emphysema or bone destruction. IMPRESSION: 1. Mildly comminuted and significantly displaced fracture of the distal femoral metadiaphysis as described. No intra-articular extension of the fracture. 2. 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 Electronically Signed   By: Richardean Sale M.D.   On: 08/10/2020 15:47   DG Chest Portable 1 View  Result Date: 08/10/2020 CLINICAL DATA:  Altered mental status EXAM: PORTABLE CHEST 1 VIEW COMPARISON:  Radiograph 07/28/2020 FINDINGS: Chronically coarsened interstitial changes in the lungs are not significantly changed from comparison studies. These are slightly more pronounced in the left lung than  right. No new focal consolidative opacity is seen. No visible pneumothorax or effusion though portion of the left costophrenic sulcus is collimated from view. Cardiac size is within normal limits for portable technique. The aorta is calcified. The remaining cardiomediastinal contours are unremarkable. No acute osseous or soft tissue abnormality. Degenerative changes are present in the imaged spine and shoulders. Telemetry leads and nasal cannula overlie the chest. IMPRESSION: Chronically coarsened interstitial changes in the lungs. No convincing new focal consolidative opacity. Electronically Signed   By: Lovena Le M.D.   On: 08/10/2020 15:08    Review of Systems  Constitutional: Positive for activity change.  HENT: Negative.   Eyes: Negative.   Respiratory: Negative.   Cardiovascular: Negative.   Endocrine: Negative.   Musculoskeletal: Positive for back pain.  Skin: Negative.   Allergic/Immunologic: Negative.   Neurological: Positive for weakness.  Psychiatric/Behavioral: Negative.    Blood pressure (!) 159/74, pulse 92, temperature 97.8 F (36.6 C), temperature source Oral, resp. rate (!) 25, SpO2 94 %. Physical Exam Constitutional:      Appearance: She is obese.  HENT:     Head: Normocephalic and atraumatic.     Nose: Nose normal.     Mouth/Throat:     Mouth: Mucous membranes are moist.  Eyes:     Extraocular Movements: Extraocular movements intact.     Pupils: Pupils are equal, round, and reactive to light.  Cardiovascular:     Rate and Rhythm: Normal rate and regular rhythm.  Musculoskeletal:     Cervical back: Normal range of motion and neck supple.  Neurological:     Mental Status: She is alert.     Comments: Patient is a very emotionally consumed with pain.  Any attempt to move the right lower extremity creates substantial pain.  She voluntarily moves her upper extremities but does not voluntarily move her lower extremities.  She is slouched in a wheelchair with a severe  degree of valgus deformity in her right knee.  Cranial nerve examination is intact.  Pupils are 4 mm and briskly reactive light and accommodation extraocular movements are full the face is symmetric.  Upper extremities have equal strength.     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  This is on unenhanced cranial CT.  She will need an MRI of the brain with and without contrast.  Further planning for intervention will be based on the MRI itself and she may either require surgical biopsy or extirpation of the lesion depending on its morphologic characteristics.  Will follow along.  Susan Odom 08/10/2020, 5:16 PM

## 2020-08-10 NOTE — ED Provider Notes (Signed)
[facility] EMERGENCY DEPARTMENT Provider Note   CSN: [id number] Arrival date & time: 08/10/20  1254     History Chief Complaint  Patient presents with  . Leg Pain    Susan Odom is a 74 y.o. female history of diabetes, hypertension, obesity, pressure ulcer.  Patient presents today for evaluation of right knee pain she reports intermittent pain for the past 2 weeks, had originally been pain of the right hip but over the past few days she feels that her right knee has become the focal point of her pain.  She describes severe constant pain worse with movement, no alleviating factors or radiation of pain.  Associated with right knee swelling.  She reports she has been unable to ambulate over the past few days secondary to pain.  Patient cannot recall if she had any fall she reports feeling more confused over the last few days she is not sure why.  Patient has asked me to speak with her daughter Susan Odom for further information, patient lives with her daughter. - Additional history obtained from both phone by patient's daughter.  Susan Odom confirms that patient has been complaining of right hip pain for the past 2 weeks, she had work-up several days ago including MRI without acute findings.  Susan Odom reports that patient suffered a fall 3 days ago and has had worsening right knee pain since that time.  She reports over the last 2 weeks patient has been "talking out of her head", she reports that the patient was diagnosed with a UTI 3 days ago and has been taking Keflex as prescribed for treatment but the confusion does not seem to be improving, reports the confusion is only intermittent she was repetitive questions and be temporarily confused before it resolves.  No fever/chills, head injury, back pain, chest pain, abdominal pain, vomiting, diarrhea, numbness/tingling, weakness or any additional concerns.  HPI     Past Medical History:  Diagnosis Date  . Diabetes  mellitus   . Hypertension     Patient Active Problem List   Diagnosis Date Noted  . Femur fracture, right ([place])   . Leukocytosis   . Brain mass   . Blunt trauma of hip   . Class 1 obesity due to excess calories with body mass index (BMI) of 32.0 to 32.9 in adult   . Hypertension   . Rhabdomyolysis 07/28/2020  . Fall 07/28/2020  . Pressure ulcer 07/28/2020  . Generalized weakness 07/28/2020    Past Surgical History:  Procedure Laterality Date  . BREAST SURGERY       OB History   No obstetric history on file.     No family history on file.  Social History   Tobacco Use  . Smoking status: Never Smoker  . Smokeless tobacco: Never Used  Substance Use Topics  . Alcohol use: No  . Drug use: No    Home Medications Prior to Admission medications   Medication Sig Start Date End Date Taking? Authorizing Provider  ALPRAZolam Duanne Moron) 1 MG tablet Take 1 mg by mouth 4 (four) times daily.    [provider]  amLODipine (NORVASC) 10 MG tablet Take 0.5 tablets (5 mg total) by mouth daily. 07/29/20   Barton Dubois, MD  canagliflozin (INVOKANA) 100 MG TABS tablet Take 100 mg by mouth daily before breakfast.    [provider]  cephALEXin (KEFLEX) 500 MG capsule Take 1 capsule (500 mg total) by mouth 4 (four) times daily. 08/07/20   Zammit,  Broadus John, MD  gabapentin (NEURONTIN) 600 MG tablet Take 600 mg by mouth 3 (three) times daily. 07/25/20   [provider]  IBU 800 MG tablet Take 800 mg by mouth every 8 (eight) hours as needed for pain. 07/24/20   [provider]  losartan (COZAAR) 100 MG tablet Take 100 mg by mouth daily.    [provider]  methocarbamol (ROBAXIN) 500 MG tablet Take 1 tablet (500 mg total) by mouth every 12 (twelve) hours as needed for muscle spasms. 07/29/20   Barton Dubois, MD  oxyCODONE-acetaminophen (PERCOCET) 5-325 MG tablet Take 1 tablet by mouth every 6 (six) hours as needed. 08/07/20   Milton Ferguson, MD  predniSONE  (DELTASONE) 10 MG tablet Take 2 tablets (20 mg total) by mouth daily. 08/07/20   Milton Ferguson, MD  Vitamin D, Ergocalciferol, (DRISDOL) 1.25 MG (50000 UNIT) CAPS capsule Take 50,000 Units by mouth once a week. 03/16/20   [provider]    Allergies    Patient has no known allergies.  Review of Systems   Review of Systems Ten systems are reviewed and are negative for acute change except as noted in the HPI  Physical Exam Updated Vital Signs BP (!) 149/80   Pulse 85   Temp 97.8 F (36.6 C) (Oral)   Resp (!) 24   SpO2 90%   Physical Exam Constitutional:      General: She is not in acute distress.    Appearance: Normal appearance. She is well-developed. She is not ill-appearing or diaphoretic.  HENT:     Head: Normocephalic and atraumatic.  Eyes:     General: Vision grossly intact. Gaze aligned appropriately.     Pupils: Pupils are equal, round, and reactive to light.  Neck:     Trachea: Trachea and phonation normal.  Pulmonary:     Effort: Pulmonary effort is normal. No respiratory distress.  Abdominal:     General: There is no distension.     Palpations: Abdomen is soft.     Tenderness: There is no abdominal tenderness. There is no guarding or rebound.  Musculoskeletal:        General: Normal range of motion.     Cervical back: Normal range of motion.     Comments: Right leg shortened compared to left.  No tenderness to palpation at the hip or pelvis.  Thigh nontender.  Right knee tender to palpation, no erythema or overlying skin change, moderate swelling.  Right lower leg ankle and foot nontender, good range of motion at the right ankle without pain.  Skin:    General: Skin is warm and dry.  Neurological:     Mental Status: She is alert.     GCS: GCS eye subscore is 4. GCS verbal subscore is 5. GCS motor subscore is 6.     Comments: Speech is clear and goal oriented, follows commands Major Cranial nerves without deficit, no facial droop Moves extremities  without ataxia, coordination intact  Psychiatric:        Behavior: Behavior normal.     ED Results / Procedures / Treatments   Labs (all labs ordered are listed, but only abnormal results are displayed) Labs Reviewed  CBC WITH DIFFERENTIAL/PLATELET - Abnormal; Notable for the following components:      Result Value   WBC 14.0 (*)    Neutro Abs 11.7 (*)    Abs Immature Granulocytes 0.59 (*)    All other components within normal limits  COMPREHENSIVE METABOLIC PANEL - Abnormal;  Notable for the following components:   Sodium 134 (*)    Chloride 97 (*)    Glucose, Bld 270 (*)    BUN 26 (*)    Albumin 3.3 (*)    Total Bilirubin 1.4 (*)    All other components within normal limits  C-REACTIVE PROTEIN - Abnormal; Notable for the following components:   CRP 5.4 (*)    All other components within normal limits  SARS CORONAVIRUS 2 BY RT PCR (HOSPITAL ORDER, [facility])  SEDIMENTATION RATE  URINALYSIS, ROUTINE W REFLEX MICROSCOPIC  MAGNESIUM  TROPONIN I (HIGH SENSITIVITY)    EKG EKG Interpretation  Date/Time:  Friday August 10 2020 16:27:45 EDT Ventricular Rate:  89 PR Interval:    QRS Duration: 87 QT Interval:  451 QTC Calculation: 549 R Axis:   24 Text Interpretation: Sinus rhythm Borderline T abnormalities, anterior leads Prolonged QT interval Poor data quality No significant change since last tracing Confirmed by Dorie Rank [phone]) on 08/10/2020 4:31:34 PM   Radiology DG Knee 1-2 Views Right  Result Date: 08/10/2020 CLINICAL DATA:  Right knee pain after multiple falls. EXAM: RIGHT KNEE - 1-2 VIEW COMPARISON:  None. FINDINGS: Severely displaced and comminuted oblique fracture is seen involving the distal right femur. There is slight overriding of the fracture fragments. Degenerative changes are seen involving the right knee. IMPRESSION: Severely displaced and comminuted oblique fracture of distal right femur. Electronically Signed   By: Marijo Conception M.D.   On: 08/10/2020 13:24   CT Head Wo Contrast  Result Date: 08/10/2020 CLINICAL DATA:  Mental status change, history of falls EXAM: CT HEAD WITHOUT CONTRAST TECHNIQUE: Contiguous axial images were obtained from the base of the skull through the vertex without intravenous contrast. COMPARISON:  CT 10/17/2013 FINDINGS: Brain: Ill-defined slightly hyperattenuating lobular masslike lesions about the corpus callosum,, interventricular septum and fornix with some hypoattenuating, likely vasogenic edema crossing genu of the corpus callosum and deep white matter adjacent the left lateral ventricle as well. Overall, incompletely characterized on the CT imaging. No areas of hyperdense hemorrhage. Some local mass effect and partial effacement of the lateral ventricles is noted. No extra-axial collection or hemorrhage is evident. Chronic mild expansion of the retro cerebellar CSF space likely reflecting arachnoid cyst versus mega cisterna magna. Symmetric prominence of the ventricles, cisterns and sulci compatible with parenchymal volume loss. Chronic small vessel ischemic changes are similar to priors. Vascular: Atherosclerotic calcification of the carotid siphons and intradural vertebral arteries. No hyperdense vessel. Skull: No calvarial fracture or suspicious osseous lesion. No scalp swelling or hematoma. Sinuses/Orbits: Paranasal sinuses and mastoid air cells are predominantly clear. Included orbital structures are unremarkable. Other: None IMPRESSION: Lobular masslike lesion centered predominantly along the corpus callosum, interventricular septum and fornix with likely surrounding vasogenic edema which crosses the genu of the corpus callosum and is also present the left frontal white matter. Primary differential considerations would include a intracranial neoplasm such as CNS lymphoma or glioblastoma multiforme. Overall, incompletely characterized on this unenhanced head CT. Recommend further evaluation  with MRI with contrast. No other acute intracranial abnormality. Background of some mild chronic parenchymal volume loss and microvascular angiopathy changes with intracranial atherosclerosis. These results were called by telephone at the time of interpretation on 08/10/2020 at 4:01 pm to provider [place] , who verbally acknowledged these results. Electronically Signed   By: Lovena Le M.D.   On: 08/10/2020 16:01   CT Knee Right Wo Contrast  Result Date: 08/10/2020  CLINICAL DATA:  Frequent falls.  Distal femur fracture. EXAM: CT OF THE RIGHT KNEE WITHOUT CONTRAST TECHNIQUE: Multidetector CT imaging of the right knee was performed according to the standard protocol. Multiplanar CT image reconstructions were also generated. COMPARISON:  Radiographs same date. FINDINGS: Bones/Joint/Cartilage Oblique fracture of the distal femoral metadiaphysis is again noted with mild comminution. This fracture is associated with up to 4.3 cm of medial displacement as well as significant overriding of the fracture fragments. There is no intra-articular extension of the fracture. The patella, proximal tibia and proximal fibula are intact. There are advanced tricompartmental degenerative changes at the knee with large osteophytes and intra-articular loose bodies. There is a small joint effusion. Ligaments Suboptimally assessed by CT. The anterior cruciate ligament is not well visualized. Muscles and Tendons The extensor mechanism is intact. Mild generalized muscular atrophy. Soft tissues Small hematoma adjacent to the fracture. No foreign body, soft tissue emphysema or bone destruction. IMPRESSION: 1. Mildly comminuted and significantly displaced fracture of the distal femoral metadiaphysis as described. No intra-articular extension of the fracture. 2. 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 Electronically Signed   By: Carey Bullocks M.D.   On:  08/10/2020 15:47   DG Chest Portable 1 View  Result Date: 08/10/2020 CLINICAL DATA:  Altered mental status EXAM: PORTABLE CHEST 1 VIEW COMPARISON:  Radiograph 07/28/2020 FINDINGS: Chronically coarsened interstitial changes in the lungs are not significantly changed from comparison studies. These are slightly more pronounced in the left lung than right. No new focal consolidative opacity is seen. No visible pneumothorax or effusion though portion of the left costophrenic sulcus is collimated from view. Cardiac size is within normal limits for portable technique. The aorta is calcified. The remaining cardiomediastinal contours are unremarkable. No acute osseous or soft tissue abnormality. Degenerative changes are present in the imaged spine and shoulders. Telemetry leads and nasal cannula overlie the chest. IMPRESSION: Chronically coarsened interstitial changes in the lungs. No convincing new focal consolidative opacity. Electronically Signed   By: Kreg Shropshire M.D.   On: 08/10/2020 15:08    Procedures Procedures (including critical care time)  Medications Ordered in ED Medications  acetaminophen (TYLENOL) tablet 650 mg (has no administration in time range)    Or  acetaminophen (TYLENOL) suppository 650 mg (has no administration in time range)  oxyCODONE (Oxy IR/ROXICODONE) immediate release tablet 5 mg (has no administration in time range)  fentaNYL (SUBLIMAZE) injection 12.5-50 mcg (has no administration in time range)  ondansetron (ZOFRAN) tablet 4 mg (has no administration in time range)    Or  ondansetron (ZOFRAN) injection 4 mg (has no administration in time range)  hydrALAZINE (APRESOLINE) injection 10 mg (has no administration in time range)  fentaNYL (SUBLIMAZE) injection 50 mcg (50 mcg Intravenous Given 08/10/20 1415)  sodium chloride 0.9 % bolus 500 mL (0 mLs Intravenous Stopped 08/10/20 1613)  cefTRIAXone (ROCEPHIN) 1 g in sodium chloride 0.9 % 100 mL IVPB (0 g Intravenous Stopped  08/10/20 1501)    ED Course  I have reviewed the triage vital signs and the nursing notes.  Pertinent labs & imaging results that were available during my care of the patient were reviewed by me and considered in my medical decision making (see chart for details).  Clinical Course as of Aug 10 1634  Fri Aug 10, 2020  1502 CT, knee immoblizer, NPO, plans repair today   [BM]  1608 Dr. Jacqulyn Bath   [BM]    Clinical Course User  Index [BM] Gari Crown   MDM Rules/Calculators/A&P                         Additional history obtained from: 1. Nursing notes from this visit. 2. Electronic medical record review. 3. Family, daughter Susan Odom.  Permission given by patient to discuss case with her daughter. --------------------------- 74 year old female presents with right knee pain over the last few days inability to walk, has right leg shortening.  She has fracture on x-ray today.  She is neurovascular intact strong equal pedal pulses.  Additionally she is endorsing some confusion over the last 2 weeks, she was also diagnosed with a UTI few days ago and has been taking Keflex.  I have added urinalysis and chest x-ray for evaluation of confusion in elderly patient.  We will also obtain CT head given patient's recent falls and confusion.  Will give IV Rocephin for treatment of UTI, reviewed urine culture from this week, it was unsuccessful, multiple species were present and they suggested recollection. - I reviewed and interpreted labs which include: Covid test negative CRP elevated but ESR within normal limits, doubt septic arthritis. CMP shows no emergent electrolyte derangement, AKI, LFT elevations or gap. CBC shows nonspecific leukocytosis, suspect this may be from her UTI versus fracture, doubt septic arthritis at this time.  DG Right knee:  IMPRESSION:  Severely displaced and comminuted oblique fracture of distal right  femur.   CXR:  IMPRESSION:  Chronically coarsened  interstitial changes in the lungs.    No convincing new focal consolidative opacity.  ------------------ 3:02 PM: Discussed the case with Dr. Marlou Sa, recommends patient placement in a knee immobilizer, obtaining CT of the right knee, and having patient n.p.o., plans to repair today. - CT head:  IMPRESSION:  Lobular masslike lesion centered predominantly along the corpus  callosum, interventricular septum and fornix with likely surrounding  vasogenic edema which crosses the genu of the corpus callosum and is  also present the left frontal white matter. Primary differential  considerations would include a intracranial neoplasm such as CNS  lymphoma or glioblastoma multiforme. Overall, incompletely  characterized on this unenhanced head CT. Recommend further  evaluation with MRI with contrast.    No other acute intracranial abnormality.    Background of some mild chronic parenchymal volume loss and  microvascular angiopathy changes with intracranial atherosclerosis.    - Patient reassessed resting comfortably no acute distress, discussed plan of care with patient she is agreeable to admission and MRI of the head.  I also discussed plan of care with patient's daughter who is in agreement with plan.  Consult placed to hospitalist service and neurosurgery. - 4:08 PM: Discussed case with Dr. Doristine Bosworth, patient accepted to hospitalist service. - Updated orthopedics physician assistant on CT findings and plan for hospitalist admission. - 4:40 PM: Discussed case with neurosurgeon Dr. Ellene Route, will see patient in consult.  Patient admitted to hospitalist service.   Note: Portions of this report may have been transcribed using voice recognition software. Every effort was made to ensure accuracy; however, inadvertent computerized transcription errors may still be present. Final Clinical Impression(s) / ED Diagnoses Final diagnoses:  Closed displaced oblique fracture of shaft of right femur,  initial encounter ([place])  Urinary tract infection with hematuria, site unspecified  Transient alteration of awareness  Abnormal CT of the head    Rx / DC Orders ED Discharge Orders    None       Nuala Alpha  A, PA-C 08/10/20 1702    Fredia Sorrow, MD 08/31/20 [phone]

## 2020-08-10 NOTE — ED Notes (Signed)
Pt back in room from MRI.

## 2020-08-10 NOTE — ED Notes (Signed)
Ortho tech at bedside 

## 2020-08-10 NOTE — ED Triage Notes (Signed)
Emergency Medicine Provider Triage Evaluation Note  Susan Odom , a 74 y.o. female  was evaluated in triage.  Pt complains of right knee and leg pain.  Patient states she has had pain intermittently for 2 weeks.  Today her knee is much more swollen.  She denies fevers or trauma.  She reports mostly now her pain is in her knee, but sometimes is in her calf.  Recently seen at [facility], had work-up including MRI of the lumbar spine.  Review of Systems  Positive: Right leg and knee pain Negative: Numbness, fever, trauma  Physical Exam  BP (!) 145/69 (BP Location: Left Arm)    Pulse 85    Temp 97.8 F (36.6 C) (Oral)    Resp 16    SpO2 94%  Gen:   Awake, no distress   HEENT:  Atraumatic  Resp:  Normal effort  Cardiac:  Normal rate  Abd:   Nondistended, nontender  MSK:   Right knee nonerythematous, however swollen and warm to touch.  Limited range of motion.  Patient also with tenderness palpation of the calf. Neuro:  Speech clear  Medical Decision Making  Medically screening exam initiated at 1:17 PM.  Appropriate orders placed.  Susan Odom was informed that the remainder of the evaluation will be completed by another provider, this initial triage assessment does not replace that evaluation, and the importance of remaining in the ED until their evaluation is complete.  Clinical Impression   Right leg and knee pain.  Consider DVT.  Consider away.  Consider fusion.  Consider septic joint, although patient without erythema or fever.  Will obtain labs including CRP and ESR.  X-ray and DVT US ordered.    Franchot Heidelberg, PA-C 08/10/20 1318

## 2020-08-10 NOTE — ED Notes (Signed)
Pt not in room.

## 2020-08-10 NOTE — ED Triage Notes (Signed)
C/O sudden leg pain this AM; arrived from OP clinic. Stated her right leg became swollen overnight.

## 2020-08-10 NOTE — Progress Notes (Addendum)
Second attempt lower extremity venous duplex- patient now in MRI. Will attempt again as schedule permits.  08/10/2020 4:59 PM Kelby Aline., MHA, RVT, RDCS, RDMS   Attempted lower extremity venous duplex, however RN is currently trying to get a line in. Will attempt again as schedule permits.  08/10/2020 1:56 PM Maudry Mayhew, MHA, RVT, RDCS, RDMS

## 2020-08-10 NOTE — ED Notes (Signed)
Pt transported to CT ?

## 2020-08-10 NOTE — ED Provider Notes (Signed)
Medical screening examination/treatment/procedure(s) were conducted as a shared visit with non-physician practitioner(s) and myself.  I personally evaluated the patient during the encounter.      Patient seen by me along with physician assistant. Patient with some new confusion according to her daughter. Patient with a complaint of right leg pain and knee pain. Patient was sudden pain in the leg this morning. Sent in from outpatient clinic. Patient seen in the [facility] emergency department for back pain on September 13. Had MRI done I think of the back at that time. But patient had a fall while in the emergency department. Patient's had falls in the past according to daughter. But does live with the daughter and no known fall since that time. X-ray here of the right femur shows a distal femur fracture with some displacement. Patient has excellent dorsalis pedis pulse excellent cap refill. Able to move her toes. Will need admission for repair of this but in addition patient also has urinary tract infection will need treatment for that. And according to daughter has increased level of confusion which may be secondary to the urinary tract infection, urinalysis pending,  but we will CT her head because of the history of falls. Patient most likely will be a hospitalist admission Dr. Marlou Sa from orthopedics was contacted and he will see the patient in consultation for the femur fracture.   Fredia Sorrow, MD 08/10/20 1510

## 2020-08-10 NOTE — Consult Note (Addendum)
ORTHOPAEDIC CONSULTATION  REQUESTING PHYSICIAN: Pahwani, Michell Heinrich, MD  Chief Complaint: "my right knee hurts"  HPI: Susan Odom is a 74 y.o. female who presents with right knee pain.  She was evaluated for right hip pain following a fall several days ago.  She notes increased right knee pain over the last couple days without new fall reportedly.  She has large amount of swelling to the right knee and radiographs show distal femur fracture with displacement.  She denies any history of surgery to the right knee.  Denies taking any blood thinners. Last ate around 1530.  Also notes right groin pain and right ankle pain.  She has swelling around the left ankle.  Denies significant pain elsewhere in the extremities.    Past Medical History:  Diagnosis Date  . Diabetes mellitus   . Hypertension    Past Surgical History:  Procedure Laterality Date  . BREAST SURGERY     Social History   Socioeconomic History  . Marital status: Married    Spouse name: Not on file  . Number of children: Not on file  . Years of education: Not on file  . Highest education level: Not on file  Occupational History  . Not on file  Tobacco Use  . Smoking status: Never Smoker  . Smokeless tobacco: Never Used  Substance and Sexual Activity  . Alcohol use: No  . Drug use: No  . Sexual activity: Not on file  Other Topics Concern  . Not on file  Social History Narrative  . Not on file   Social Determinants of Health   Financial Resource Strain:   . Difficulty of Paying Living Expenses: Not on file  Food Insecurity:   . Worried About Charity fundraiser in the Last Year: Not on file  . Ran Out of Food in the Last Year: Not on file  Transportation Needs:   . Lack of Transportation (Medical): Not on file  . Lack of Transportation (Non-Medical): Not on file  Physical Activity:   . Days of Exercise per Week: Not on file  . Minutes of Exercise per Session: Not on file  Stress:   . Feeling of Stress : Not  on file  Social Connections:   . Frequency of Communication with Friends and Family: Not on file  . Frequency of Social Gatherings with Friends and Family: Not on file  . Attends Religious Services: Not on file  . Active Member of Clubs or Organizations: Not on file  . Attends Archivist Meetings: Not on file  . Marital Status: Not on file   No family history on file. - negative except otherwise stated in the family history section No Known Allergies Prior to Admission medications   Medication Sig Start Date End Date Taking? Authorizing Provider  ALPRAZolam Duanne Moron) 1 MG tablet Take 1 mg by mouth 4 (four) times daily.    [provider]  amLODipine (NORVASC) 10 MG tablet Take 0.5 tablets (5 mg total) by mouth daily. 07/29/20   Barton Dubois, MD  canagliflozin (INVOKANA) 100 MG TABS tablet Take 100 mg by mouth daily before breakfast.    [provider]  cephALEXin (KEFLEX) 500 MG capsule Take 1 capsule (500 mg total) by mouth 4 (four) times daily. 08/07/20   Milton Ferguson, MD  gabapentin (NEURONTIN) 600 MG tablet Take 600 mg by mouth 3 (three) times daily. 07/25/20   [provider]  HYDROcodone-acetaminophen (NORCO) 10-325 MG tablet  08/09/20  [provider]  IBU 800 MG tablet Take 800 mg by mouth every 8 (eight) hours as needed for pain. 07/24/20   [provider]  losartan (COZAAR) 100 MG tablet Take 100 mg by mouth daily.    [provider]  methocarbamol (ROBAXIN) 500 MG tablet Take 1 tablet (500 mg total) by mouth every 12 (twelve) hours as needed for muscle spasms. 07/29/20   Barton Dubois, MD  NON FORMULARY Fanny cream 08/07/20   [provider]  oxyCODONE-acetaminophen (PERCOCET) 5-325 MG tablet Take 1 tablet by mouth every 6 (six) hours as needed. 08/07/20   Milton Ferguson, MD  predniSONE (DELTASONE) 10 MG tablet Take 2 tablets (20 mg total) by mouth daily. 08/07/20   Milton Ferguson, MD  Vitamin D, Ergocalciferol,  (DRISDOL) 1.25 MG (50000 UNIT) CAPS capsule Take 50,000 Units by mouth once a week. 03/16/20   [provider]   DG Knee 1-2 Views Right  Result Date: 08/10/2020 CLINICAL DATA:  Right knee pain after multiple falls. EXAM: RIGHT KNEE - 1-2 VIEW COMPARISON:  None. FINDINGS: Severely displaced and comminuted oblique fracture is seen involving the distal right femur. There is slight overriding of the fracture fragments. Degenerative changes are seen involving the right knee. IMPRESSION: Severely displaced and comminuted oblique fracture of distal right femur. Electronically Signed   By: Marijo Conception M.D.   On: 08/10/2020 13:24   CT Head Wo Contrast  Result Date: 08/10/2020 CLINICAL DATA:  Mental status change, history of falls EXAM: CT HEAD WITHOUT CONTRAST TECHNIQUE: Contiguous axial images were obtained from the base of the skull through the vertex without intravenous contrast. COMPARISON:  CT 10/17/2013 FINDINGS: Brain: Ill-defined slightly hyperattenuating lobular masslike lesions about the corpus callosum,, interventricular septum and fornix with some hypoattenuating, likely vasogenic edema crossing genu of the corpus callosum and deep white matter adjacent the left lateral ventricle as well. Overall, incompletely characterized on the CT imaging. No areas of hyperdense hemorrhage. Some local mass effect and partial effacement of the lateral ventricles is noted. No extra-axial collection or hemorrhage is evident. Chronic mild expansion of the retro cerebellar CSF space likely reflecting arachnoid cyst versus mega cisterna magna. Symmetric prominence of the ventricles, cisterns and sulci compatible with parenchymal volume loss. Chronic small vessel ischemic changes are similar to priors. Vascular: Atherosclerotic calcification of the carotid siphons and intradural vertebral arteries. No hyperdense vessel. Skull: No calvarial fracture or suspicious osseous lesion. No scalp swelling or hematoma.  Sinuses/Orbits: Paranasal sinuses and mastoid air cells are predominantly clear. Included orbital structures are unremarkable. Other: None IMPRESSION: Lobular masslike lesion centered predominantly along the corpus callosum, interventricular septum and fornix with likely surrounding vasogenic edema which crosses the genu of the corpus callosum and is also present the left frontal white matter. Primary differential considerations would include a intracranial neoplasm such as CNS lymphoma or glioblastoma multiforme. Overall, incompletely characterized on this unenhanced head CT. Recommend further evaluation with MRI with contrast. No other acute intracranial abnormality. Background of some mild chronic parenchymal volume loss and microvascular angiopathy changes with intracranial atherosclerosis. These results were called by telephone at the time of interpretation on 08/10/2020 at 4:01 pm to provider [facility] , who verbally acknowledged these results. Electronically Signed   By: Lovena Le M.D.   On: 08/10/2020 16:01   CT Knee Right Wo Contrast  Result Date: 08/10/2020 CLINICAL DATA:  Frequent falls.  Distal femur fracture. EXAM: CT OF THE RIGHT KNEE WITHOUT CONTRAST TECHNIQUE: Multidetector CT imaging of  the right knee was performed according to the standard protocol. Multiplanar CT image reconstructions were also generated. COMPARISON:  Radiographs same date. FINDINGS: Bones/Joint/Cartilage Oblique fracture of the distal femoral metadiaphysis is again noted with mild comminution. This fracture is associated with up to 4.3 cm of medial displacement as well as significant overriding of the fracture fragments. There is no intra-articular extension of the fracture. The patella, proximal tibia and proximal fibula are intact. There are advanced tricompartmental degenerative changes at the knee with large osteophytes and intra-articular loose bodies. There is a small joint effusion. Ligaments Suboptimally  assessed by CT. The anterior cruciate ligament is not well visualized. Muscles and Tendons The extensor mechanism is intact. Mild generalized muscular atrophy. Soft tissues Small hematoma adjacent to the fracture. No foreign body, soft tissue emphysema or bone destruction. IMPRESSION: 1. Mildly comminuted and significantly displaced fracture of the distal femoral metadiaphysis as described. No intra-articular extension of the fracture. 2. 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 Electronically Signed   By: Richardean Sale M.D.   On: 08/10/2020 15:47   DG Chest Portable 1 View  Result Date: 08/10/2020 CLINICAL DATA:  Altered mental status EXAM: PORTABLE CHEST 1 VIEW COMPARISON:  Radiograph 07/28/2020 FINDINGS: Chronically coarsened interstitial changes in the lungs are not significantly changed from comparison studies. These are slightly more pronounced in the left lung than right. No new focal consolidative opacity is seen. No visible pneumothorax or effusion though portion of the left costophrenic sulcus is collimated from view. Cardiac size is within normal limits for portable technique. The aorta is calcified. The remaining cardiomediastinal contours are unremarkable. No acute osseous or soft tissue abnormality. Degenerative changes are present in the imaged spine and shoulders. Telemetry leads and nasal cannula overlie the chest. IMPRESSION: Chronically coarsened interstitial changes in the lungs. No convincing new focal consolidative opacity. Electronically Signed   By: Lovena Le M.D.   On: 08/10/2020 15:08   - pertinent xrays, CT, MRI studies were reviewed and independently interpreted  Positive ROS: All other systems have been reviewed and were otherwise negative with the exception of those mentioned in the HPI and as above.  Physical Exam: General: Alert, no acute distress Psychiatric: Patient is competent for consent  with normal mood and affect Lymphatic: No axillary or cervical lymphadenopathy Cardiovascular: No pedal edema Respiratory: No cyanosis, no use of accessory musculature GI: No organomegaly, abdomen is soft and non-tender    Images:  @ENCIMAGES @  Labs:  Lab Results  Component Value Date   ESRSEDRATE 19 08/10/2020   CRP 5.4 (H) 08/10/2020   REPTSTATUS 08/08/2020 FINAL 08/07/2020   CULT MULTIPLE SPECIES PRESENT, SUGGEST RECOLLECTION (A) 08/07/2020    Lab Results  Component Value Date   ALBUMIN 3.3 (L) 08/10/2020   ALBUMIN 3.4 (L) 08/07/2020   ALBUMIN 4.2 07/28/2020    Neurologic: Patient does not have protective sensation bilateral lower extremities.   MUSCULOSKELETAL:   Ortho exam demonstrates right knee with significant amount of swelling.  No overlying abrasion or laceration or skin injury noted.  Significant tenderness palpation overlying the area of swelling.  Diffuse tenderness throughout the right ankle.  Compartments of the calf are soft and nontender.  Able to dorsiflex and plantarflex her right ankle actively.  2+ DP pulse of the right lower extremity.  No significant tenderness to palpation throughout the right foot.  Large amount of swelling throughout the left ankle with mild tenderness to palpation.  No significant pain throughout  the left tib-fib region, knee, groin.  Tenderness to palpation over the right groin that patient notes is 8/10.  No significant tenderness to palpation throughout the bilateral hands, wrists, forearms, elbow.  Mild pain with range of motion of the bilateral shoulders.  Assessment: Closed displaced distal femur fracture of the right lower extremity  Plan: Case discussed with Dr. Alphonzo Severance.  Plan for operative fixation of the right distal femur fracture tomorrow morning.  Hold anticoagulation.  Nonweightbearing in the meantime and following procedure.  N.p.o after midnight tonight.  Ordered radiographs of the bilateral ankles and right hip  based on exam findings.  We will review these prior to procedure.  Thank you for the consult and the opportunity to see Susan Odom, Susan Odom [phone] 5:21 PM

## 2020-08-10 NOTE — H&P (Signed)
History and Physical    HETHER ANSELMO WEX:[id number] DOB: 1946-02-18 DOA: 08/10/2020  PCP: Redmond School, MD  Patient coming from: home  I have personally briefly reviewed patient's old medical records in [place]  Chief Complaint: right leg/knee pain since 2 weeks  HPI: Susan Odom is a 74 y.o. female with medical history significant of hypertension, diabetes, obesity presented to ER for evaluation of right knee pain.  Patient reports severe right knee pain since 2 weeks however it got worse this morning, 10 out of 10, associated with right knee swelling.  Reports that she fell couple of days ago and hit her right leg.  Reports midsternal chest pain, nonradiating, constant, 5-6 out of 10, denies association with shortness of breath, palpitation, diaphoresis.  she went to [facility] ED on 9/14 with back pain.  Had MRI done-which shows degenerative changes.  UA was concerning for UTI therefore she discharged home on Keflex, Percocet and prednisone and recommended outpatient neurosurgery follow-up.  Was seen by neurosurgery this morning due to worsening back pain and difficulty walking since 2 weeks.  At the office: She was not able to stand or bear weight, was extremely in pain,  not able to obtain radiographs therefore she was sent to ER for further evaluation and management.  Daughter reports gradual onset of confusion & cognitive decline recently.  She denies headache, blurry vision, lightheadedness, dizziness, seizures, chest pain, shortness of breath, palpitation, nausea, vomiting, abdominal pain or bowel changes.  She has been taking Keflex as prescribed.  She continues to have dysuria and foul-smelling urine since 2 weeks.  She lives with her daughter at home.  No history of smoking, alcohol, illicit drug use.  ED Course: Upon arrival to ED: Patient's vital signs stable.  CBC shows leukocytosis of 14,000, CMP shows sodium of 134, ESR: WNL, CRP: 5.4, COVID-19 negative, UA: Pending.   She was given a dose of Rocephin in ED.  X-ray of right hip shows: Severely displaced and comminuted oblique fracture of distal right femur.  Chest x-ray shows no acute findings.  CT of right knee shows: Mildly comminuted and significantly displaced fracture of distal femoral metadiaphysis.  No intra-articular extension of the fracture.  CT head was performed which showed: Lobular masslike lesion centered predominantly along the corpus callosum, IV septum and fornix with likely surrounding vasogenic edema which crosses the genu of the corpus callosum and is also present in the left frontal white matter-TTE including intracranial neoplasm such as CNS lymphoma or glioblastoma multiforme.  Recommended MRI.  EDP consulted orthopedic surgery and neurosurgery.  MRI ordered and is pending.  [facility] hospitalist consulted for admission   review of Systems: As per HPI otherwise negative.    Past Medical History:  Diagnosis Date  . Diabetes mellitus   . Hypertension     Past Surgical History:  Procedure Laterality Date  . BREAST SURGERY       reports that she has never smoked. She has never used smokeless tobacco. She reports that she does not drink alcohol and does not use drugs.  No Known Allergies  No family history on file.  Prior to Admission medications   Medication Sig Start Date End Date Taking? Authorizing Provider  ALPRAZolam Duanne Moron) 1 MG tablet Take 1 mg by mouth 4 (four) times daily.    [provider]  amLODipine (NORVASC) 10 MG tablet Take 0.5 tablets (5 mg total) by mouth daily. 07/29/20   Barton Dubois, MD  canagliflozin [facility]) 100 MG  TABS tablet Take 100 mg by mouth daily before breakfast.    [provider]  cephALEXin (KEFLEX) 500 MG capsule Take 1 capsule (500 mg total) by mouth 4 (four) times daily. 08/07/20   Milton Ferguson, MD  gabapentin (NEURONTIN) 600 MG tablet Take 600 mg by mouth 3 (three) times daily. 07/25/20   [provider]  IBU 800 MG  tablet Take 800 mg by mouth every 8 (eight) hours as needed for pain. 07/24/20   [provider]  losartan (COZAAR) 100 MG tablet Take 100 mg by mouth daily.    [provider]  methocarbamol (ROBAXIN) 500 MG tablet Take 1 tablet (500 mg total) by mouth every 12 (twelve) hours as needed for muscle spasms. 07/29/20   Barton Dubois, MD  oxyCODONE-acetaminophen (PERCOCET) 5-325 MG tablet Take 1 tablet by mouth every 6 (six) hours as needed. 08/07/20   Milton Ferguson, MD  predniSONE (DELTASONE) 10 MG tablet Take 2 tablets (20 mg total) by mouth daily. 08/07/20   Milton Ferguson, MD  Vitamin D, Ergocalciferol, (DRISDOL) 1.25 MG (50000 UNIT) CAPS capsule Take 50,000 Units by mouth once a week. 03/16/20   [provider]    Physical Exam: Vitals:   08/10/20 1301 08/10/20 1430 08/10/20 1445  BP: (!) 145/69 134/68 (!) 149/80  Pulse: 85 86 85  Resp: 16  (!) 24  Temp: 97.8 F (36.6 C)    TempSrc: Oral    SpO2: 94% 91% 90%    Constitutional: NAD, calm, comfortable, on room air, communicating well, obese Eyes: PERRL, lids and conjunctivae normal ENMT: Mucous membranes are moist. Posterior pharynx clear of any exudate or lesions.Normal dentition.  Neck: normal, supple, no masses, no thyromegaly Respiratory: clear to auscultation bilaterally, no wheezing, no crackles. Normal respiratory effort. No accessory muscle use.  Cardiovascular: Regular rate and rhythm, no murmurs / rubs / gallops. No extremity edema. 2+ pedal pulses. No carotid bruits.  Abdomen: no tenderness, no masses palpated. No hepatosplenomegaly. Bowel sounds positive.  Musculoskeletal: Right leg: In a splint.   Skin: no rashes, lesions, ulcers. No induration Neurologic: CN 2-12 grossly intact. Sensation intact, DTR normal. Strength 5/5 in all 4.  Psychiatric: Normal judgment and insight. Alert and oriented x 3. Normal mood.    Labs on Admission: I have personally reviewed following labs and imaging  studies  CBC: Recent Labs  Lab 08/07/20 0729 08/10/20 1405  WBC 11.3* 14.0*  NEUTROABS 9.1* 11.7*  HGB 14.3 13.7  HCT 44.5 42.6  MCV 94.5 92.6  PLT 199 202   Basic Metabolic Panel: Recent Labs  Lab 08/07/20 0729 08/10/20 1405  NA 135 134*  K 4.3 4.4  CL 101 97*  CO2 23 24  GLUCOSE 274* 270*  BUN 30* 26*  CREATININE 0.75 0.78  CALCIUM 9.5 10.0   GFR: Estimated Creatinine Clearance: 80.3 mL/min (by C-G formula based on SCr of 0.78 mg/dL). Liver Function Tests: Recent Labs  Lab 08/07/20 0729 08/10/20 1405  AST 13* 16  ALT 17 16  ALKPHOS 61 75  BILITOT 1.5* 1.4*  PROT 6.6 6.8  ALBUMIN 3.4* 3.3*   No results for input(s): LIPASE, AMYLASE in the last 168 hours. No results for input(s): AMMONIA in the last 168 hours. Coagulation Profile: No results for input(s): INR, PROTIME in the last 168 hours. Cardiac Enzymes: No results for input(s): CKTOTAL, CKMB, CKMBINDEX, TROPONINI in the last 168 hours. BNP (last 3 results) No results for input(s): PROBNP in the last 8760 hours. HbA1C: No results  for input(s): HGBA1C in the last 72 hours. CBG: No results for input(s): GLUCAP in the last 168 hours. Lipid Profile: No results for input(s): CHOL, HDL, LDLCALC, TRIG, CHOLHDL, LDLDIRECT in the last 72 hours. Thyroid Function Tests: No results for input(s): TSH, T4TOTAL, FREET4, T3FREE, THYROIDAB in the last 72 hours. Anemia Panel: No results for input(s): VITAMINB12, FOLATE, FERRITIN, TIBC, IRON, RETICCTPCT in the last 72 hours. Urine analysis:    Component Value Date/Time   COLORURINE YELLOW 08/07/2020 0722   APPEARANCEUR TURBID (A) 08/07/2020 0722   LABSPEC 1.024 08/07/2020 0722   PHURINE 5.0 08/07/2020 0722   GLUCOSEU >=500 (A) 08/07/2020 0722   HGBUR LARGE (A) 08/07/2020 0722   BILIRUBINUR NEGATIVE 08/07/2020 0722   KETONESUR 20 (A) 08/07/2020 0722   PROTEINUR 30 (A) 08/07/2020 0722   UROBILINOGEN 0.2 08/23/2013 1404   NITRITE NEGATIVE 08/07/2020 0722    LEUKOCYTESUR MODERATE (A) 08/07/2020 0722    Radiological Exams on Admission: DG Knee 1-2 Views Right  Result Date: 08/10/2020 CLINICAL DATA:  Right knee pain after multiple falls. EXAM: RIGHT KNEE - 1-2 VIEW COMPARISON:  None. FINDINGS: Severely displaced and comminuted oblique fracture is seen involving the distal right femur. There is slight overriding of the fracture fragments. Degenerative changes are seen involving the right knee. IMPRESSION: Severely displaced and comminuted oblique fracture of distal right femur. Electronically Signed   By: Marijo Conception M.D.   On: 08/10/2020 13:24   CT Head Wo Contrast  Result Date: 08/10/2020 CLINICAL DATA:  Mental status change, history of falls EXAM: CT HEAD WITHOUT CONTRAST TECHNIQUE: Contiguous axial images were obtained from the base of the skull through the vertex without intravenous contrast. COMPARISON:  CT 10/17/2013 FINDINGS: Brain: Ill-defined slightly hyperattenuating lobular masslike lesions about the corpus callosum,, interventricular septum and fornix with some hypoattenuating, likely vasogenic edema crossing genu of the corpus callosum and deep white matter adjacent the left lateral ventricle as well. Overall, incompletely characterized on the CT imaging. No areas of hyperdense hemorrhage. Some local mass effect and partial effacement of the lateral ventricles is noted. No extra-axial collection or hemorrhage is evident. Chronic mild expansion of the retro cerebellar CSF space likely reflecting arachnoid cyst versus mega cisterna magna. Symmetric prominence of the ventricles, cisterns and sulci compatible with parenchymal volume loss. Chronic small vessel ischemic changes are similar to priors. Vascular: Atherosclerotic calcification of the carotid siphons and intradural vertebral arteries. No hyperdense vessel. Skull: No calvarial fracture or suspicious osseous lesion. No scalp swelling or hematoma. Sinuses/Orbits: Paranasal sinuses and mastoid  air cells are predominantly clear. Included orbital structures are unremarkable. Other: None IMPRESSION: Lobular masslike lesion centered predominantly along the corpus callosum, interventricular septum and fornix with likely surrounding vasogenic edema which crosses the genu of the corpus callosum and is also present the left frontal white matter. Primary differential considerations would include a intracranial neoplasm such as CNS lymphoma or glioblastoma multiforme. Overall, incompletely characterized on this unenhanced head CT. Recommend further evaluation with MRI with contrast. No other acute intracranial abnormality. Background of some mild chronic parenchymal volume loss and microvascular angiopathy changes with intracranial atherosclerosis. These results were called by telephone at the time of interpretation on 08/10/2020 at 4:01 pm to provider [facility] , who verbally acknowledged these results. Electronically Signed   By: Lovena Le M.D.   On: 08/10/2020 16:01   CT Knee Right Wo Contrast  Result Date: 08/10/2020 CLINICAL DATA:  Frequent falls.  Distal femur fracture. EXAM: CT OF THE RIGHT KNEE  WITHOUT CONTRAST TECHNIQUE: Multidetector CT imaging of the right knee was performed according to the standard protocol. Multiplanar CT image reconstructions were also generated. COMPARISON:  Radiographs same date. FINDINGS: Bones/Joint/Cartilage Oblique fracture of the distal femoral metadiaphysis is again noted with mild comminution. This fracture is associated with up to 4.3 cm of medial displacement as well as significant overriding of the fracture fragments. There is no intra-articular extension of the fracture. The patella, proximal tibia and proximal fibula are intact. There are advanced tricompartmental degenerative changes at the knee with large osteophytes and intra-articular loose bodies. There is a small joint effusion. Ligaments Suboptimally assessed by CT. The anterior cruciate ligament is  not well visualized. Muscles and Tendons The extensor mechanism is intact. Mild generalized muscular atrophy. Soft tissues Small hematoma adjacent to the fracture. No foreign body, soft tissue emphysema or bone destruction. IMPRESSION: 1. Mildly comminuted and significantly displaced fracture of the distal femoral metadiaphysis as described. No intra-articular extension of the fracture. 2. 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 Electronically Signed   By: Richardean Sale M.D.   On: 08/10/2020 15:47   DG Chest Portable 1 View  Result Date: 08/10/2020 CLINICAL DATA:  Altered mental status EXAM: PORTABLE CHEST 1 VIEW COMPARISON:  Radiograph 07/28/2020 FINDINGS: Chronically coarsened interstitial changes in the lungs are not significantly changed from comparison studies. These are slightly more pronounced in the left lung than right. No new focal consolidative opacity is seen. No visible pneumothorax or effusion though portion of the left costophrenic sulcus is collimated from view. Cardiac size is within normal limits for portable technique. The aorta is calcified. The remaining cardiomediastinal contours are unremarkable. No acute osseous or soft tissue abnormality. Degenerative changes are present in the imaged spine and shoulders. Telemetry leads and nasal cannula overlie the chest. IMPRESSION: Chronically coarsened interstitial changes in the lungs. No convincing new focal consolidative opacity. Electronically Signed   By: Lovena Le M.D.   On: 08/10/2020 15:08    EKG: Independently reviewed.  A. fib, borderline T wave abnormalities in anterior leads, prolonged QT interval.   Assessment/Plan Principal Problem:   Femur fracture, right (HCC) Active Problems:   Fall   Hypertension   Leukocytosis   Brain mass    Right femur fracture: -Reviewed x-ray of right knee and CT of right knee. -Orthopedic surgery  consulted-likely have surgery tonight. -We will keep her n.p.o. -Tylenol/oxycodone/fentanyl as needed for mild/moderate/severe pain respectively.  Monitor vitals and H&H closely.  Brain mass/frontal brain lesion: -Reviewed CT head.  Differential diagnosis including lymphoma versus glioblastoma multiforme.  MRI brain is pending. -EDP consulted neurosurgery-appreciate help.  Hypertension: -Blood pressure is stable.  Will hold home BP meds as patient is NPO.  She is on amlodipine, losartan at home. -Hydralazine as needed for blood pressure more than 160/100  Diabetes mellitus: Check A1c -Hold Invokana and started patient on sliding scale insulin  UTI: -Repeat UA is pending.  Urine culture from 9/14 shows contamination.  Patient continues to have dysuria and foul-smelling urine.  Received Rocephin once in ED.  We will continue Rocephin and follow urine culture result.  Leukocytosis: Patient is afebrile with leukocytosis of 14,000 -Could be secondary to p.o. steroids -Continue IV antibiotics.  Repeat CBC tomorrow AM. - COVID-19 is negative.  Chest x-ray is negative for pneumonia.  Diabetic neuropathy: hold gabapentin  Unable to safely start patient's home medication as med reconciliation is pending by pharmacy.  DVT prophylaxis: SCD Code Status:  Full code-confirmed with the patient Family Communication: None present at bedside.  Plan of care discussed with patient in length and she verbalized understanding and agreed with it. Disposition Plan: Likely rehab after surgery Consults called: Orthopedic surgery and neurosurgery by EDP Admission status: Inpatient   Mckinley Jewel MD [facility]  If 7PM-7AM, please contact night-coverage [email or web address] Password [facility]  08/10/2020, 4:17 PM

## 2020-08-10 NOTE — Progress Notes (Signed)
Orthopedic Tech Progress Note Patient Details:  Susan Odom 11/15/1946 [id number]  Ortho Devices Type of Ortho Device: Knee Immobilizer Ortho Device/Splint Location: RLE Ortho Device/Splint Interventions: Ordered, Application   Post Interventions Patient Tolerated: Well Instructions Provided: Care of device   Janit Pagan 08/10/2020, 4:38 PM

## 2020-08-11 ENCOUNTER — Inpatient Hospital Stay (HOSPITAL_COMMUNITY): Payer: Medicare Other

## 2020-08-11 ENCOUNTER — Encounter (HOSPITAL_COMMUNITY): Payer: Medicare Other

## 2020-08-11 ENCOUNTER — Inpatient Hospital Stay (HOSPITAL_COMMUNITY): Payer: Medicare Other | Admitting: Anesthesiology

## 2020-08-11 ENCOUNTER — Encounter (HOSPITAL_COMMUNITY)
Admission: EM | Disposition: A | Discharge status: Skilled Nursing Facility | Payer: Self-pay | Source: Ambulatory Visit | Attending: Internal Medicine

## 2020-08-11 DIAGNOSIS — Z7189 Other specified counseling: Secondary | ICD-10-CM

## 2020-08-11 DIAGNOSIS — Z515 Encounter for palliative care: Secondary | ICD-10-CM

## 2020-08-11 DIAGNOSIS — S7290XA Unspecified fracture of unspecified femur, initial encounter for closed fracture: Secondary | ICD-10-CM | POA: Diagnosis present

## 2020-08-11 DIAGNOSIS — S72331A Displaced oblique fracture of shaft of right femur, initial encounter for closed fracture: Secondary | ICD-10-CM

## 2020-08-11 DIAGNOSIS — S7292XA Unspecified fracture of left femur, initial encounter for closed fracture: Secondary | ICD-10-CM

## 2020-08-11 DIAGNOSIS — Z66 Do not resuscitate: Secondary | ICD-10-CM

## 2020-08-11 DIAGNOSIS — W19XXXA Unspecified fall, initial encounter: Secondary | ICD-10-CM

## 2020-08-11 DIAGNOSIS — I10 Essential (primary) hypertension: Secondary | ICD-10-CM

## 2020-08-11 HISTORY — PX: FEMUR IM NAIL: SHX1597

## 2020-08-11 LAB — GLUCOSE, CAPILLARY
Glucose-Capillary: 194 mg/dL — ABNORMAL HIGH (ref 70–99)
Glucose-Capillary: 223 mg/dL — ABNORMAL HIGH (ref 70–99)
Glucose-Capillary: 193 mg/dL — ABNORMAL HIGH (ref 70–99)
Glucose-Capillary: 179 mg/dL — ABNORMAL HIGH (ref 70–99)
Glucose-Capillary: 208 mg/dL — ABNORMAL HIGH (ref 70–99)

## 2020-08-11 LAB — MRSA PCR SCREENING: MRSA by PCR: NEGATIVE

## 2020-08-11 SURGERY — INSERTION, INTRAMEDULLARY ROD, FEMUR, RETROGRADE
Anesthesia: General | Laterality: Right

## 2020-08-11 MED ORDER — METOCLOPRAMIDE HCL 5 MG/ML IJ SOLN: 5.0000 mg | Freq: Three times a day (TID) | INTRAMUSCULAR | Status: DC | PRN

## 2020-08-11 MED ORDER — PROPOFOL 10 MG/ML IV BOLUS
INTRAVENOUS | Status: DC | PRN
  Administered 2020-08-11: 100 mg via INTRAVENOUS

## 2020-08-11 MED ORDER — MENTHOL 3 MG MT LOZG
1.0000 | LOZENGE | OROMUCOSAL | Status: DC | PRN
  Filled 2020-08-11: qty 9

## 2020-08-11 MED ORDER — HYDROMORPHONE HCL 1 MG/ML IJ SOLN
INTRAMUSCULAR | Status: AC
  Filled 2020-08-11: qty 1

## 2020-08-11 MED ORDER — PHENYLEPHRINE HCL (PRESSORS) 10 MG/ML IV SOLN
INTRAVENOUS | Status: DC | PRN
  Administered 2020-08-11: 40 ug via INTRAVENOUS

## 2020-08-11 MED ORDER — METOCLOPRAMIDE HCL 5 MG PO TABS
5.0000 mg | ORAL_TABLET | Freq: Three times a day (TID) | ORAL | Status: DC | PRN
  Filled 2020-08-11: qty 2

## 2020-08-11 MED ORDER — ONDANSETRON HCL 4 MG PO TABS: 4.0000 mg | ORAL_TABLET | Freq: Four times a day (QID) | ORAL | Status: DC | PRN

## 2020-08-11 MED ORDER — ONDANSETRON HCL 4 MG/2ML IJ SOLN: 4.0000 mg | Freq: Four times a day (QID) | INTRAMUSCULAR | Status: DC | PRN

## 2020-08-11 MED ORDER — TRANEXAMIC ACID-NACL 1000-0.7 MG/100ML-% IV SOLN
INTRAVENOUS | Status: AC
  Filled 2020-08-11: qty 100

## 2020-08-11 MED ORDER — SODIUM CHLORIDE 0.9 % IV SOLN: 1.0000 g | Freq: Once | INTRAVENOUS | Status: DC

## 2020-08-11 MED ORDER — CHLORHEXIDINE GLUCONATE 0.12 % MT SOLN
OROMUCOSAL | Status: AC
  Administered 2020-08-11: 15 mL via OROMUCOSAL
  Filled 2020-08-11: qty 15

## 2020-08-11 MED ORDER — PHENOL 1.4 % MT LIQD: 1.0000 | OROMUCOSAL | Status: DC | PRN

## 2020-08-11 MED ORDER — SODIUM CHLORIDE 0.9 % IV SOLN: INTRAVENOUS | Status: DC | PRN

## 2020-08-11 MED ORDER — LACTATED RINGERS IV SOLN: INTRAVENOUS | Status: DC | PRN

## 2020-08-11 MED ORDER — ASPIRIN EC 81 MG PO TBEC
81.0000 mg | DELAYED_RELEASE_TABLET | Freq: Two times a day (BID) | ORAL | Status: DC
  Administered 2020-08-11 – 2020-08-13 (×6): 81 mg via ORAL
  Filled 2020-08-11 (×6): qty 1

## 2020-08-11 MED ORDER — VANCOMYCIN HCL 1000 MG IV SOLR
INTRAVENOUS | Status: AC
  Filled 2020-08-11: qty 1000

## 2020-08-11 MED ORDER — CEFAZOLIN SODIUM-DEXTROSE 2-4 GM/100ML-% IV SOLN
2.0000 g | Freq: Four times a day (QID) | INTRAVENOUS | Status: AC
  Administered 2020-08-11 (×2): 2 g via INTRAVENOUS
  Filled 2020-08-11 (×2): qty 100

## 2020-08-11 MED ORDER — PROPOFOL 10 MG/ML IV BOLUS
INTRAVENOUS | Status: AC
  Filled 2020-08-11: qty 40

## 2020-08-11 MED ORDER — PHENYLEPHRINE HCL-NACL 10-0.9 MG/250ML-% IV SOLN
INTRAVENOUS | Status: DC | PRN
  Administered 2020-08-11: 35 ug/min via INTRAVENOUS
  Administered 2020-08-11: 50 ug/min via INTRAVENOUS

## 2020-08-11 MED ORDER — LIDOCAINE 2% (20 MG/ML) 5 ML SYRINGE
INTRAMUSCULAR | Status: DC | PRN
  Administered 2020-08-11: 100 mg via INTRAVENOUS

## 2020-08-11 MED ORDER — ALBUMIN HUMAN 5 % IV SOLN: INTRAVENOUS | Status: DC | PRN

## 2020-08-11 MED ORDER — VANCOMYCIN HCL 1000 MG IV SOLR
INTRAVENOUS | Status: DC | PRN
  Administered 2020-08-11: 1000 mg via TOPICAL

## 2020-08-11 MED ORDER — DOCUSATE SODIUM 100 MG PO CAPS
100.0000 mg | ORAL_CAPSULE | Freq: Two times a day (BID) | ORAL | Status: DC
  Administered 2020-08-12 – 2020-08-21 (×17): 100 mg via ORAL
  Filled 2020-08-11 (×19): qty 1

## 2020-08-11 MED ORDER — CHLORHEXIDINE GLUCONATE CLOTH 2 % EX PADS
6.0000 | MEDICATED_PAD | Freq: Every day | CUTANEOUS | Status: DC
  Administered 2020-08-12 – 2020-08-20 (×9): 6 via TOPICAL

## 2020-08-11 MED ORDER — MEPERIDINE HCL 25 MG/ML IJ SOLN: 6.2500 mg | INTRAMUSCULAR | Status: DC | PRN

## 2020-08-11 MED ORDER — SUGAMMADEX SODIUM 200 MG/2ML IV SOLN
INTRAVENOUS | Status: DC | PRN
  Administered 2020-08-11: 300 mg via INTRAVENOUS

## 2020-08-11 MED ORDER — TRANEXAMIC ACID 1000 MG/10ML IV SOLN
INTRAVENOUS | Status: DC | PRN
  Administered 2020-08-11: 1000 mg via INTRAVENOUS

## 2020-08-11 MED ORDER — ARTIFICIAL TEARS OPHTHALMIC OINT: TOPICAL_OINTMENT | OPHTHALMIC | Status: DC | PRN

## 2020-08-11 MED ORDER — FENTANYL CITRATE (PF) 250 MCG/5ML IJ SOLN
INTRAMUSCULAR | Status: AC
  Filled 2020-08-11: qty 5

## 2020-08-11 MED ORDER — LACTATED RINGERS IV SOLN: INTRAVENOUS | Status: DC

## 2020-08-11 MED ORDER — ROCURONIUM BROMIDE 10 MG/ML (PF) SYRINGE
PREFILLED_SYRINGE | INTRAVENOUS | Status: DC | PRN
  Administered 2020-08-11: 40 mg via INTRAVENOUS
  Administered 2020-08-11: 20 mg via INTRAVENOUS

## 2020-08-11 MED ORDER — POTASSIUM CHLORIDE IN NACL 20-0.9 MEQ/L-% IV SOLN
INTRAVENOUS | Status: DC
  Filled 2020-08-11 (×2): qty 1000

## 2020-08-11 MED ORDER — ONDANSETRON HCL 4 MG/2ML IJ SOLN
INTRAMUSCULAR | Status: DC | PRN
  Administered 2020-08-11: 4 mg via INTRAVENOUS

## 2020-08-11 MED ORDER — SUCCINYLCHOLINE CHLORIDE 20 MG/ML IJ SOLN
INTRAMUSCULAR | Status: DC | PRN
  Administered 2020-08-11: 120 mg via INTRAVENOUS

## 2020-08-11 MED ORDER — PHENYLEPHRINE HCL (PRESSORS) 10 MG/ML IV SOLN
INTRAVENOUS | Status: DC | PRN
  Administered 2020-08-11: 120 ug via INTRAVENOUS

## 2020-08-11 MED ORDER — PROMETHAZINE HCL 25 MG/ML IJ SOLN: 6.2500 mg | INTRAMUSCULAR | Status: DC | PRN

## 2020-08-11 MED ORDER — HYDROMORPHONE HCL 1 MG/ML IJ SOLN
0.2500 mg | INTRAMUSCULAR | Status: DC | PRN
  Administered 2020-08-11: 0.25 mg via INTRAVENOUS
  Administered 2020-08-11: 0.5 mg via INTRAVENOUS
  Administered 2020-08-11: 0.25 mg via INTRAVENOUS

## 2020-08-11 MED ORDER — BUPIVACAINE-EPINEPHRINE 0.5% -1:200000 IJ SOLN
INTRAMUSCULAR | Status: AC
  Filled 2020-08-11: qty 1

## 2020-08-11 MED ORDER — CHLORHEXIDINE GLUCONATE 0.12 % MT SOLN: 15.0000 mL | Freq: Once | OROMUCOSAL | Status: AC

## 2020-08-11 SURGICAL SUPPLY — 75 items
BANDAGE ESMARK 6X9 LF (GAUZE/BANDAGES/DRESSINGS) IMPLANT
BIT DRILL CALIBRATED 4.3MMX365 (DRILL) IMPLANT
BIT DRILL CROWE PNT TWST 4.5MM (DRILL) IMPLANT
BLADE SURG 15 STRL LF DISP TIS (BLADE) ×1 IMPLANT
BLADE SURG 15 STRL SS (BLADE) ×3
BNDG CMPR 9X6 STRL LF SNTH (GAUZE/BANDAGES/DRESSINGS)
BNDG CMPR MED 15X6 ELC VLCR LF (GAUZE/BANDAGES/DRESSINGS) ×1
BNDG COHESIVE 6X5 TAN STRL LF (GAUZE/BANDAGES/DRESSINGS) ×3 IMPLANT
BNDG ELASTIC 4X5.8 VLCR STR LF (GAUZE/BANDAGES/DRESSINGS) ×3 IMPLANT
BNDG ELASTIC 6X15 VLCR STRL LF (GAUZE/BANDAGES/DRESSINGS) ×2 IMPLANT
BNDG ELASTIC 6X5.8 VLCR STR LF (GAUZE/BANDAGES/DRESSINGS) ×3 IMPLANT
BNDG ESMARK 6X9 LF (GAUZE/BANDAGES/DRESSINGS)
BNDG GAUZE ELAST 4 BULKY (GAUZE/BANDAGES/DRESSINGS) ×3 IMPLANT
CABLE CERLAGE W/CRIMP 1.8 (Cable) ×2 IMPLANT
CABLE CERLAGE W/CRIMP 1.8MM (Cable) ×2 IMPLANT
CLOSURE STERI-STRIP 1/2X4 (GAUZE/BANDAGES/DRESSINGS) ×1
CLSR STERI-STRIP ANTIMIC 1/2X4 (GAUZE/BANDAGES/DRESSINGS) ×1 IMPLANT
CUFF TOURN SGL QUICK 34 (TOURNIQUET CUFF)
CUFF TOURN SGL QUICK 42 (TOURNIQUET CUFF) IMPLANT
CUFF TRNQT CYL 34X4.125X (TOURNIQUET CUFF) IMPLANT
DRAPE C-ARM 42X72 X-RAY (DRAPES) ×3 IMPLANT
DRAPE HALF SHEET 40X57 (DRAPES) ×6 IMPLANT
DRAPE IMP U-DRAPE 54X76 (DRAPES) ×3 IMPLANT
DRAPE ORTHO SPLIT 77X108 STRL (DRAPES) ×6
DRAPE SURG ORHT 6 SPLT 77X108 (DRAPES) ×2 IMPLANT
DRAPE U-SHAPE 47X51 STRL (DRAPES) ×3 IMPLANT
DRILL CALIBRATED 4.3MMX365 (DRILL) ×3
DRILL CROWE POINT TWIST 4.5MM (DRILL) ×3
DRSG AQUACEL AG ADV 3.5X 4 (GAUZE/BANDAGES/DRESSINGS) ×6 IMPLANT
DRSG AQUACEL AG ADV 3.5X 6 (GAUZE/BANDAGES/DRESSINGS) ×2 IMPLANT
DRSG MEPILEX SACRM 8.7X9.8 (GAUZE/BANDAGES/DRESSINGS) ×2 IMPLANT
DURAPREP 26ML APPLICATOR (WOUND CARE) ×5 IMPLANT
ELECT REM PT RETURN 9FT ADLT (ELECTROSURGICAL) ×3
ELECTRODE REM PT RTRN 9FT ADLT (ELECTROSURGICAL) ×1 IMPLANT
GAUZE SPONGE 4X4 12PLY STRL (GAUZE/BANDAGES/DRESSINGS) ×6 IMPLANT
GAUZE XEROFORM 1X8 LF (GAUZE/BANDAGES/DRESSINGS) ×3 IMPLANT
GLOVE BIO SURGEON ST LM GN SZ9 (GLOVE) ×3 IMPLANT
GLOVE BIOGEL PI IND STRL 8 (GLOVE) ×1 IMPLANT
GLOVE BIOGEL PI INDICATOR 8 (GLOVE) ×2
GLOVE ECLIPSE 8.0 STRL XLNG CF (GLOVE) ×3 IMPLANT
GOWN STRL REUS W/ TWL LRG LVL3 (GOWN DISPOSABLE) ×2 IMPLANT
GOWN STRL REUS W/ TWL XL LVL3 (GOWN DISPOSABLE) ×1 IMPLANT
GOWN STRL REUS W/TWL LRG LVL3 (GOWN DISPOSABLE) ×6
GOWN STRL REUS W/TWL XL LVL3 (GOWN DISPOSABLE) ×3
GUIDEPIN 3.2X17.5 THRD DISP (PIN) ×2 IMPLANT
GUIDEWIRE BEAD TIP (WIRE) ×2 IMPLANT
IMMOBILIZER KNEE 22 UNIV (SOFTGOODS) ×2 IMPLANT
KIT BASIN OR (CUSTOM PROCEDURE TRAY) ×3 IMPLANT
KIT TURNOVER KIT B (KITS) ×3 IMPLANT
MANIFOLD NEPTUNE II (INSTRUMENTS) ×3 IMPLANT
NAIL FEM RETRO 13.5X380 (Nail) ×2 IMPLANT
NS IRRIG 1000ML POUR BTL (IV SOLUTION) ×7 IMPLANT
PACK GENERAL/GYN (CUSTOM PROCEDURE TRAY) ×3 IMPLANT
PACK UNIVERSAL I (CUSTOM PROCEDURE TRAY) ×3 IMPLANT
PAD ARMBOARD 7.5X6 YLW CONV (MISCELLANEOUS) ×6 IMPLANT
SCREW CORT TI DBL LEAD 5X32 (Screw) ×2 IMPLANT
SCREW CORT TI DBL LEAD 5X56 (Screw) ×2 IMPLANT
SCREW CORT TI DBL LEAD 5X60 (Screw) ×2 IMPLANT
SCREW CORT TI DBL LEAD 5X70 (Screw) ×2 IMPLANT
SCREW CORT TI DBL LEAD 5X75 (Screw) ×2 IMPLANT
SCREW CORT TI DBL LEAD 5X85 (Screw) ×2 IMPLANT
STOCKINETTE IMPERVIOUS LG (DRAPES) ×3 IMPLANT
SUT ETHILON 3 0 PS 1 (SUTURE) ×12 IMPLANT
SUT MNCRL AB 3-0 PS2 18 (SUTURE) ×2 IMPLANT
SUT VIC AB 0 CT1 27 (SUTURE) ×9
SUT VIC AB 0 CT1 27XBRD ANBCTR (SUTURE) ×2 IMPLANT
SUT VIC AB 1 CT1 36 (SUTURE) ×6 IMPLANT
SUT VIC AB 2-0 CT1 27 (SUTURE) ×3
SUT VIC AB 2-0 CT1 TAPERPNT 27 (SUTURE) IMPLANT
SUT VIC AB 2-0 CTB1 (SUTURE) ×6 IMPLANT
SYR CONTROL 10ML LL (SYRINGE) ×3 IMPLANT
TOWEL GREEN STERILE (TOWEL DISPOSABLE) ×3 IMPLANT
TOWEL GREEN STERILE FF (TOWEL DISPOSABLE) ×3 IMPLANT
WATER STERILE IRR 1000ML POUR (IV SOLUTION) ×3 IMPLANT
WIRE MICRO SET SILHO 5FR 7 (SHEATH) ×2 IMPLANT

## 2020-08-11 NOTE — Anesthesia Procedure Notes (Signed)
Procedure Name: Intubation Date/Time: 08/11/2020 9:17 AM Performed by: Eligha Bridegroom, CRNA Pre-anesthesia Checklist: Patient identified, Emergency Drugs available, Suction available, Patient being monitored and Timeout performed Patient Re-evaluated:Patient Re-evaluated prior to induction Oxygen Delivery Method: Circle system utilized Preoxygenation: Pre-oxygenation with 100% oxygen Induction Type: IV induction and Rapid sequence Laryngoscope Size: Glidescope Grade View: Grade II Tube type: Oral Tube size: 7.0 mm Number of attempts: 1 Airway Equipment and Method: Stylet and Video-laryngoscopy Placement Confirmation: ETT inserted through vocal cords under direct vision,  positive ETCO2 and breath sounds checked- equal and bilateral Secured at: 22 cm Tube secured with: Tape Dental Injury: Teeth and Oropharynx as per pre-operative assessment

## 2020-08-11 NOTE — Anesthesia Preprocedure Evaluation (Addendum)
Anesthesia Evaluation  Patient identified by MRN, date of birth, ID band Patient awake    Reviewed: Allergy & Precautions, NPO status , Patient's Chart, lab work & pertinent test results  Airway Mallampati: III  TM Distance: >3 FB Neck ROM: Full    Dental  (+) Poor Dentition, Chipped, Loose, Dental Advisory Given   Pulmonary neg pulmonary ROS,    Pulmonary exam normal breath sounds clear to auscultation       Cardiovascular hypertension, Pt. on medications Normal cardiovascular exam Rhythm:Regular Rate:Normal     Neuro/Psych negative neurological ROS  negative psych ROS   GI/Hepatic negative GI ROS, Neg liver ROS,   Endo/Other  diabetes  Renal/GU negative Renal ROS     Musculoskeletal negative musculoskeletal ROS (+)   Abdominal   Peds  Hematology negative hematology ROS (+)   Anesthesia Other Findings   Reproductive/Obstetrics negative OB ROS                            Anesthesia Physical Anesthesia Plan  ASA: III  Anesthesia Plan: General   Post-op Pain Management:    Induction: Intravenous  PONV Risk Score and Plan: 4 or greater and Ondansetron, Dexamethasone, Treatment may vary due to age or medical condition and Midazolam  Airway Management Planned: Oral ETT  Additional Equipment: None  Intra-op Plan:   Post-operative Plan: Extubation in OR  Informed Consent: I have reviewed the patients History and Physical, chart, labs and discussed the procedure including the risks, benefits and alternatives for the proposed anesthesia with the patient or authorized representative who has indicated his/her understanding and acceptance.     Dental advisory given and Consent reviewed with POA  Plan Discussed with: CRNA  Anesthesia Plan Comments:        Anesthesia Quick Evaluation

## 2020-08-11 NOTE — Progress Notes (Signed)
Patient ready for surgery this morning  Plan open reduction with cables and retrograde IM nail  Risk benefits discussed  Agree with note by Lurena Joiner from yesterday as per my attestation.  Foley will be placed for 24 hours per nursing request  Begin CPM machine postop for knee range of motion  All questions answered

## 2020-08-11 NOTE — Brief Op Note (Signed)
   08/11/2020  12:32 PM  PATIENT:  Mardi Mainland  74 y.o. female  PRE-OPERATIVE DIAGNOSIS:  right femur fracture  POST-OPERATIVE DIAGNOSIS:  right femur fracture  PROCEDURE:  Procedure(s): INTRAMEDULLARY (IM) RETROGRADE FEMORAL NAILING, with biomet cables  SURGEON:  Surgeon(s): Marlou Sa, Tonna Corner, MD  ASSISTANT: magnant pa  ANESTHESIA:   general  EBL: 50 ml    Total I/O In: 1000 [I.V.:1000] Out: 285 [Urine:225; Blood:60]  BLOOD ADMINISTERED: none  DRAINS: none   LOCAL MEDICATIONS USED:  vanco  SPECIMEN:  No Specimen  COUNTS:  YES  TOURNIQUET:  * No tourniquets in log *  DICTATION: .Other Dictation: Dictation Number [id number]  PLAN OF CARE: Admit to inpatient   PATIENT DISPOSITION:  PACU - hemodynamically stable

## 2020-08-11 NOTE — Plan of Care (Signed)

## 2020-08-11 NOTE — Consult Note (Signed)
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 Please assist with GOC/MDM and family support"  HPI:  Per intake H&P --> [name] is a 74 year old individual who presented to the emergency department after being seen in my office earlier this morning.  She is referred via Dr. Asencion Odom for difficulties with back pain and inability to walk for 2 weeks time.  Over the last 2 weeks she has had 2 visits to the emergency department at [facility].  When she presented to the emergency room they did a work-up including a CT scan of the lower lumbar spine and the abdomen and pelvis looking for some hip pathology none we'll was particularly noted an MRI of the lumbar spine then demonstrated that she had minimal spondylitic changes in the lower lumbar spine.  Patient has not been able to walk for 2 weeks time and in our office I noted that she had a severe valgus deformity of the right knee.  She could not stand to bear weight and was extremely uncomfortable and pain we are not able to obtain a radiograph in our office because she was not able to be moved from the chair and I advised that she be transferred to the emergency department for further work-up.  While in the ED a CT scan of the brain was performed as her daughter notes that she has had some mental status changes the scan of the brain demonstrates that there is a lesion in the left frontal region which extends to the corpus callosum that is poorly characterized on a noncontrast CT of the head.  The patient will require an MRI of the brain.  As regards the right knee she appears to have a distal femur fracture which she is receiving surgery for this morning.   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  Clinical Assessment/Goals of Care: I have  reviewed medical records including EPIC notes, labs and imaging, received report from bedside RN, assessed the patient.    I called Susan Odom (daughter)to further discuss diagnosis prognosis, GOC, EOL wishes, disposition and options.   I introduced Palliative Medicine as specialized medical care for people living with serious illness. It focuses on providing relief from the symptoms and stress of a serious illness. The goal is to improve quality of life for both the patient and the family.  I asked Susan Odom to tell me about her mother. Susan Odom shares that her mother is from [facility]. She now lives in [place]. 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 They share one child, Susan Odom. Susan Odom states that her mother gets great joy out of sitting outside and watching the birds. She also likes to read her bible. She use to enjoy [place] on [place] with her sister. She is a woman of faith and practices within the [employer] faith.   From a mobility perspective up until about a month ago Susan Odom had been mobile with a walker though she has declined significantly. She now is wheelchair dependent. Susan Odom performs all bADL's for her and has moved into her home to help her.   In terms of that Susan Odom understands from the medical providers her mother is getting her femur repaired today. 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 I asked her if she understands what that is. We discussed how this is a type of cancer which is often very fast growing and can cause great symptom burden. We discussed that the prognosis is limited to months and often hospice care if a reasonable consideration.   A detailed discussion was had today regarding advanced directives - patient had never completed these though she and her daughter did have various conversations regarding her wishes.  A MOST form was introduced. Concepts specific to code status, artifical  feeding and hydration, continued IV antibiotics and rehospitalization was had. I completed a MOST form today. The patient and family outlined their wishes for the following treatment decisions:  Cardiopulmonary Resuscitation: Do Not Attempt Resuscitation (DNR/No CPR) / DNI  Medical Interventions: Limited Additional Interventions: Use medical treatment, IV fluids and cardiac monitoring as indicated, DO NOT USE intubation or mechanical ventilation. May consider use of less invasive airway support such as BiPAP or CPAP. Also provide comfort measures. Transfer to the hospital if indicated. Avoid intensive care.   Antibiotics: Antibiotics if indicated  IV Fluids: IV fluids if indicated  Feeding Tube: No feeding tube   The difference between a aggressive medical intervention path and a palliative comfort care path for this patient at this time was had. Values and goals of care important to patient and family were attempted to be elicited. Patient loves being in her home - if prognosis is terminal she would wish to be able to enjoy the birds on her porch surrounded by family.   We discussed hospice in great detail.  I described hospice as a service for patients for have a life expectancy of <  6 months. It preserves dignity and quality at the end phases of life. The focus changes from curative to symptom relief. Susan Odom expresses interest in learning more about hospice services she has a preference towards [facility].   Discussed the importance of continued conversation with family and their  medical providers regarding overall plan of care and treatment options, ensuring decisions are within the context of the patients values and GOCs.  Decision Maker: Susan Odom (Daughter) [phone]  SUMMARY OF RECOMMENDATIONS   DNAR/DNI  E-MOST Completed, electric copy can be found in [facility]  Family interested to see what biopsy reveals if glioblastoma would wish to pursue hospice  [place] for informational purposes  Ongoing PMT support  Code Status/Advance Care Planning: DNAR/DNI  Palliative Prophylaxis:   Oral Care, Aspiration precautions, Delirium Precautions, Pain, Turn Q2H, Mobility  Additional Recommendations (Limitations, Scope, Preferences):  Treat what is treatable   Psycho-social/Spiritual:   Desire for further Chaplaincy support: Yes - [employer]ac[employer]  Additional Recommendations: Education on hospice   Prognosis: Likely quite poor in the setting of glioblastoma  Discharge Planning: Discharge plan is not yet clear  PPS: 20%   This conversation/these recommendations were discussed with patient primary care team, Dr. [place]  Time In: 0815 Time Out: 0930 Total Time: 75 Greater than 50%  of this time was spent counseling and coordinating care related to the above assessment and plan.  [facility] Team Team Cell Phone: [phone] Please utilize secure chat with additional questions, if there is no response within 30 minutes please call the above phone number  Palliative Medicine Team providers are available by phone from 7am to 7pm daily and can be reached through the team cell phone.  Should this patient require assistance outside of these hours, please call  the patient's attending physician.

## 2020-08-11 NOTE — Op Note (Signed)
NAME: Susan Odom, Susan Odom MEDICAL RECORD WJ:[id number] ACCOUNT [id number] DATE OF BIRTH:1946/01/22 FACILITY: [facility] LOCATION: [facility] PHYSICIAN:Alazay Leicht Randel Pigg, MD  OPERATIVE REPORT  DATE OF PROCEDURE:  08/11/2020  PREOPERATIVE DIAGNOSIS:  Right distal femur fracture.  POSTOPERATIVE DIAGNOSIS:  Right distal femur fracture.  PROCEDURE:  Right distal femur open reduction and retrograde intramedullary nailing using Biomet retrograde intramedullary nail 38 x 13.5 mm with 1 proximal interlocking screw and 4 distal interlocking screws, which were all locked to the nail.  SURGEON:  Meredith Pel, MD  ASSISTANT:  Annie Main, PA.  INDICATIONS:  This is a 74 year old patient who fell and injured her right leg.  Presents now for operative management after explanation of risks and benefits.  PROCEDURE IN DETAIL:  The patient was brought to the operating room where general anesthetic was induced.  Preoperative antibiotics administered.  Timeout was called.  The patient's right leg was prescrubbed with alcohol and Betadine, allowed to air dry,  prepped with DuraPrep solution and draped in a sterile manner.  Ioban used to cover the entire operative field.  Timeout was called.  Fracture was localized under fluoroscopy.  An incision was made approximately 8 cm parallel to the fracture in line  with the femur.  Skin and subcutaneous tissue were sharply divided.  The iliotibial band was divided and blunt dissection was performed on the superior and inferior aspect of the femur in order to facilitate a path for cable placement.  Biomet cables  were then placed with care being taken to avoid any entrapment of injury of neurovascular structures.  Two cables were utilized and using a combination of traction and fracture manipulation, a very good reduction of the fracture was achieved.  The cables  were tightened.  At this time, thorough irrigation was performed.  Cables were cut after tightening had occurred.   Next, attention was directed towards the retrograde intramedullary nail portion of the case.  An incision was made at the inferior pole of  the patella.  Skin and subcutaneous tissue were sharply divided.  The guidepin was placed under fluoroscopic guidance across the femur.  Reaming was performed.  Reaming performed up to 15 mm.  A 38 x 13.5 mm nail was placed and subsequent to that,  correct placement was confirmed in the AP and lateral planes.  Good fracture reduction was maintained.  Next, the 4 distal interlocking screws were placed and locked.  One proximal interlocking screw was placed.  All screw placement was confirmed in the  AP and lateral planes under fluoroscopy.  A thorough irrigation was then performed on all incisions.  The interlocking screw incisions were irrigated and closed using 2-0 Vicryl, 3-0 nylon.  Lateral incision irrigated and vancomycin powder was placed  within this incision, as well as within the knee incision.  Iliotibial band closed using #1 Vicryl suture, followed by interrupted inverted 0 Vicryl suture, 2-0 Vicryl suture and a 3-0 Monocryl with Steri-Strips and Aquacel dressing.  The knee incision  closed using 0 Vicryl suture, 2-0 Vicryl suture and a 3-0 nylon.  Vancomycin powder also placed within the knee.  Aquacel dressing was placed.  Ace wrap placed.  Knee immobilizer placed.  She will be nonweightbearing for 2 weeks.  We will start her on  some knee range of motion on Monday.  Luke's assistance was required at all times during the case for retraction, opening and closing limb positioning.  His assistance was a medical necessity.  VN/NUANCE  D:08/11/2020 T:08/11/2020 JOB:[id number]

## 2020-08-11 NOTE — Progress Notes (Addendum)
PROGRESS NOTE    Susan Odom  GLO:[id number] DOB: 08/12/1946 DOA: 08/10/2020 PCP: Redmond School, MD    Brief Narrative:  Susan Odom is a 74 year old female with past medical history notable for essential hypertension, diabetes, obesity who presented to the emergency department with right knee pain.  Pain present x2 weeks, but has progressed over the past 24 hours.  Associated with swelling.  Patient reports a fall few days prior.  Additionally, daughter reports gradual onset of confusion and cognitive decline recently.  Patient initially went to [facility], ED on 08/07/2020 with back pain, MRI notable for degenerative changes with UA concerning for UTI and discharged home on Keflex, Percocet and prednisone.  Was seen by neurosurgery with worsening back pain and difficulty walking for 2 weeks.  She was unable to stand or bear weight in the office; and subsequently was sent to the emergency department for further evaluation and management.  In the ED, the [facility] count 14.0, sodium 134, ESR within normal limits, CRP 5.4, Covid-19 PCR negative.  X-ray right hip notable for severe displaced and comminuted oblique fracture distal right femur.  Chest x-ray with no acute findings.  CT right knee with mildly comminuted and slightly displaced fracture distal femoral metadiaphysis.  CT head with lobular masslike lesion predominantly along the corpus callosum with likely surrounding vasogenic edema concerning for intracranial neoplasm such as lymphoma or glioblastoma multiform a.  Neurosurgery and orthopedics were consulted.  TRH consulted for admission.  Assessment & Plan:   Principal Problem:   Femur fracture, right (HCC) Active Problems:   Fall   Hypertension   Leukocytosis   Brain mass   Palliative care by specialist   DNR (do not resuscitate)   Goals of care, counseling/discussion   Right femur fracture Patient presenting with right lower extremity pain, difficulty ambulating.  Was found  to have comminuted, displaced fracture distal femoral metadiaphysis on imaging. 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  CT head without contrast with lobular masslike lesion corpus callosum, intraventricular septum and fornix with likely surrounding vasogenic edema which crosses the genu of the corpus callosum and also present in the left frontal white matter.  MR brain with and without contrast with central callosal, septal, paraventricular infiltrative enhancing tumor most suspicious for glioblastoma/high-grade glioma. --Neurosurgery following, appreciate assistance --Discussed with Dr. Mickeal Skinner, Neuro-oncology on 9/18, who will review her images and make formal consultation on Monday 9/20 --If glioblastoma/high grade glioma, given her age very poor prognosis --Palliative care consulted for assistance with goals of care/medical decision making --Per neurosurgery, may require surgical biopsy; will await further recommendations  Urinary tract infection Urinalysis with large leukoesterase, negative nitrite, many bacteria, greater than 50 WBCs.  Patient with continued dysuria and foul-smelling urine. --Urine culture: Pending --Continue ceftriaxone 1 g IV every 24 hours  Essential hypertension On amlodipine and losartan at home.  BP 129/63. --Continue to hold home antihypertensives while n.p.o. and blood pressure within normal limits. --Hydralazine IV as needed  Type 2 diabetes mellitus On Invokana outpatient, will hold while inpatient. --Insulin sliding scale for coverage --CBGs QAC/HS  Leukocytosis Patient afebrile with a WBC count of 14.0 on admission.  Etiology likely secondary to p.o. steroids started outpatient versus UTI. --Continue antibiotics  as above for UTI. --Follow CBC daily  Diabetic neuropathy: Hold gabapentin   DVT prophylaxis: SCDs Code Status:  DNR Family Communication: Updated patient extensively at bedside  Disposition Plan:  Status is: Inpatient  Remains inpatient appropriate because:Ongoing diagnostic testing needed not appropriate for outpatient work up, Unsafe d/c plan, IV treatments appropriate due to intensity of illness or inability to take PO and Inpatient level of care appropriate due to severity of illness   Dispo: The patient is from: Home              Anticipated d/c is to: To be determined              Anticipated d/c date is: > 3 days              Patient currently is not medically stable to d/c.    Consultants:   Orthopedics, Dr. Marlou Sa  Neurosurgery, Dr. Ellene Route  Palliative care  Neuro oncology, Dr. Mickeal Skinner  Procedures:   ORIF with cables and retrograde IMN, Dr. Marlou Sa 9/18  Antimicrobials:   Ceftriaxone 9/17>>   Subjective: Patient seen and examined at bedside, resting comfortably.  Just returned from PACU following repair of hip fracture.  Complaining of pain.  Continues with weakness.  No other specific complaints at this time.  Denies headache, no visual changes, no chest pain, palpitations, no shortness of breath, no abdominal pain, no fever/chills/night sweats, no nausea cefonicid/diarrhea.  No acute concerns per nursing who is present at bedside this afternoon following return from PACU.  Objective: Vitals:   08/10/20 2300 08/11/20 0300 08/11/20 0734 08/11/20 0800  BP: 140/76 129/63 123/67   Pulse: 90 84 86   Resp: $Remo'20 18 17 18  'yxOlj$ Temp: 97.8 F (36.6 C) 98.3 F (36.8 C) 97.8 F (36.6 C)   TempSrc: Axillary Oral Oral   SpO2: 94% 91% 92%     Intake/Output Summary (Last 24 hours) at 08/11/2020 1024 Last data filed at 08/11/2020 1000 Gross per 24 hour  Intake 1340 ml  Output 500 ml  Net 840 ml   There were no vitals filed for this visit.  Examination:  General exam:  Appears calm and comfortable, layered in multiple blankets Respiratory system: Clear to auscultation. Respiratory effort normal.  2 L nasal cannula postoperatively with SPO2 98% Cardiovascular system: S1 & S2 heard, RRR. No JVD, murmurs, rubs, gallops or clicks. No pedal edema. Gastrointestinal system: Abdomen is nondistended, soft and nontender. No organomegaly or masses felt. Normal bowel sounds heard. Central nervous system: Alert and oriented. No focal neurological deficits. Extremities: No peripheral edema, surgical dressings noted in place, clean/dry/intact. Skin: No rashes, lesions or ulcers Psychiatry: Judgement and insight appear normal. Mood & affect appropriate.     Data Reviewed: I have personally reviewed following labs and imaging studies  CBC: Recent Labs  Lab 08/07/20 0729 08/10/20 1405  WBC 11.3* 14.0*  NEUTROABS 9.1* 11.7*  HGB 14.3 13.7  HCT 44.5 42.6  MCV 94.5 92.6  PLT 199 101   Basic Metabolic Panel: Recent Labs  Lab 08/07/20 0729 08/10/20 1405 08/10/20 1616  NA 135 134*  --   K 4.3 4.4  --   CL 101 97*  --   CO2 23 24  --   GLUCOSE 274* 270*  --   BUN 30* 26*  --   CREATININE 0.75 0.78  --   CALCIUM 9.5 10.0  --   MG  --   --  2.0   GFR: Estimated Creatinine Clearance: 80.3 mL/min (by C-G formula based on SCr of 0.78 mg/dL). Liver Function Tests: Recent Labs  Lab 08/07/20 0729 08/10/20  1405  AST 13* 16  ALT 17 16  ALKPHOS 61 75  BILITOT 1.5* 1.4*  PROT 6.6 6.8  ALBUMIN 3.4* 3.3*   No results for input(s): LIPASE, AMYLASE in the last 168 hours. No results for input(s): AMMONIA in the last 168 hours. Coagulation Profile: No results for input(s): INR, PROTIME in the last 168 hours. Cardiac Enzymes: No results for input(s): CKTOTAL, CKMB, CKMBINDEX, TROPONINI in the last 168 hours. BNP (last 3 results) No results for input(s): PROBNP in the last 8760 hours. HbA1C: Recent Labs    08/10/20 1759  HGBA1C 7.3*   CBG: Recent Labs    Lab 08/10/20 2323 08/11/20 0555 08/11/20 0819  GLUCAP 206* 194* 223*   Lipid Profile: No results for input(s): CHOL, HDL, LDLCALC, TRIG, CHOLHDL, LDLDIRECT in the last 72 hours. Thyroid Function Tests: No results for input(s): TSH, T4TOTAL, FREET4, T3FREE, THYROIDAB in the last 72 hours. Anemia Panel: No results for input(s): VITAMINB12, FOLATE, FERRITIN, TIBC, IRON, RETICCTPCT in the last 72 hours. Sepsis Labs: No results for input(s): PROCALCITON, LATICACIDVEN in the last 168 hours.  Recent Results (from the past 240 hour(s))  Urine Culture     Status: Abnormal   Collection Time: 08/07/20  7:22 AM   Specimen: Urine, Random  Result Value Ref Range Status   Specimen Description   Final    URINE, RANDOM Performed at [facility], [address] [place], [place] [address]    Special Requests   Final    NONE Performed at [facility], [address] [place], [place] [address]    Culture MULTIPLE SPECIES PRESENT, SUGGEST RECOLLECTION (A)  Final   Report Status 08/08/2020 FINAL  Final  SARS Coronavirus 2 by RT PCR (hospital order, performed in [facility] hospital lab) Nasopharyngeal Nasopharyngeal Swab     Status: None   Collection Time: 08/07/20 10:03 AM   Specimen: Nasopharyngeal Swab  Result Value Ref Range Status   SARS Coronavirus 2 NEGATIVE NEGATIVE Final    Comment: (NOTE) SARS-CoV-2 target nucleic acids are NOT DETECTED.  The SARS-CoV-2 RNA is generally detectable in upper and lower respiratory specimens during the acute phase of infection. The lowest concentration of SARS-CoV-2 viral copies this assay can detect is 250 copies / mL. A negative result does not preclude SARS-CoV-2 infection and should not be used as the sole basis for treatment or other patient management decisions.  A negative result may occur with improper specimen collection / handling, submission of specimen other than nasopharyngeal swab, presence of viral mutation(s) within the areas targeted  by this assay, and inadequate number of viral copies (<250 copies / mL). A negative result must be combined with clinical observations, patient history, and epidemiological information.  Fact Sheet for Patients:   StrictlyIdeas.no  Fact Sheet for Healthcare Providers: BankingDealers.co.za  This test is not yet approved or  cleared by the [place] FDA and has been authorized for detection and/or diagnosis of SARS-CoV-2 by FDA under an Emergency Use Authorization (EUA).  This EUA will remain in effect (meaning this test can be used) for the duration of the COVID-19 declaration under Section 564(b)(1) of the Act, 21 U.S.C. section 360bbb-3(b)(1), unless the authorization is terminated or revoked sooner.  Performed at [facility], [address] [place], [place] [address]   SARS Coronavirus 2 by RT PCR (hospital order, performed in [facility] hospital lab) Nasopharyngeal Nasopharyngeal Swab     Status: None   Collection Time: 08/10/20  2:33 PM   Specimen: Nasopharyngeal  Swab  Result Value Ref Range Status   SARS Coronavirus 2 NEGATIVE NEGATIVE Final    Comment: (NOTE) SARS-CoV-2 target nucleic acids are NOT DETECTED.  The SARS-CoV-2 RNA is generally detectable in upper and lower respiratory specimens during the acute phase of infection. The lowest concentration of SARS-CoV-2 viral copies this assay can detect is 250 copies / mL. A negative result does not preclude SARS-CoV-2 infection and should not be used as the sole basis for treatment or other patient management decisions.  A negative result may occur with improper specimen collection / handling, submission of specimen other than nasopharyngeal swab, presence of viral mutation(s) within the areas targeted by this assay, and inadequate number of viral copies (<250 copies / mL). A negative result must be combined with clinical observations, patient history, and epidemiological  information.  Fact Sheet for Patients:   StrictlyIdeas.no  Fact Sheet for Healthcare Providers: BankingDealers.co.za  This test is not yet approved or  cleared by the [place] FDA and has been authorized for detection and/or diagnosis of SARS-CoV-2 by FDA under an Emergency Use Authorization (EUA).  This EUA will remain in effect (meaning this test can be used) for the duration of the COVID-19 declaration under Section 564(b)(1) of the Act, 21 U.S.C. section 360bbb-3(b)(1), unless the authorization is terminated or revoked sooner.  Performed at [facility], [place] [address] [place], [place] [address]   MRSA PCR Screening     Status: None   Collection Time: 08/11/20  5:28 AM   Specimen: Nasal Mucosa; Nasopharyngeal  Result Value Ref Range Status   MRSA by PCR NEGATIVE NEGATIVE Final    Comment:        The GeneXpert MRSA Assay (FDA approved for NASAL specimens only), is one component of a comprehensive MRSA colonization surveillance program. It is not intended to diagnose MRSA infection nor to guide or monitor treatment for MRSA infections. Performed at [facility], [place] [address], [place], [place] [address]          Radiology Studies: DG Knee 1-2 Views Right  Result Date: 08/10/2020 CLINICAL DATA:  Right knee pain after multiple falls. EXAM: RIGHT KNEE - 1-2 VIEW COMPARISON:  None. FINDINGS: Severely displaced and comminuted oblique fracture is seen involving the distal right femur. There is slight overriding of the fracture fragments. Degenerative changes are seen involving the right knee. IMPRESSION: Severely displaced and comminuted oblique fracture of distal right femur. Electronically Signed   By: Marijo Conception M.D.   On: 08/10/2020 13:24   DG Ankle Complete Left  Result Date: 08/10/2020 CLINICAL DATA:  Pain EXAM: LEFT ANKLE COMPLETE - 3+ VIEW COMPARISON:  None. FINDINGS: Bilateral soft tissue  swelling.  No fracture or dislocation. IMPRESSION: Bilateral soft tissue swelling without fracture dislocation. Electronically Signed   By: Dorise Bullion III M.D   On: 08/10/2020 19:48   DG Ankle Complete Right  Result Date: 08/11/2020 CLINICAL DATA:  Right hip pain. EXAM: RIGHT ANKLE - COMPLETE 3+ VIEW COMPARISON:  None. FINDINGS: Significant lateral soft tissue swelling identified. No displaced fractures are identified. There is a subtle lucency in the distal fibula medially which is favored to represent a prominent nutrient foramen. This lucency does not extend through the shaft of the distal fibula. The first lateral view was limited as the patient had a dressing noted posteriorly partially obscuring the posterior distal fibula. A repeat film was then obtained without the dressing. No definitive fractures are seen on today's study. Degenerative  changes are seen in the mid and hindfoot. IMPRESSION: No convincing evidence of fracture. Degenerative changes in the mid and hindfoot. Electronically Signed   By: Dorise Bullion III M.D   On: 08/11/2020 09:50   CT Head Wo Contrast  Result Date: 08/10/2020 CLINICAL DATA:  Mental status change, history of falls EXAM: CT HEAD WITHOUT CONTRAST TECHNIQUE: Contiguous axial images were obtained from the base of the skull through the vertex without intravenous contrast. COMPARISON:  CT 10/17/2013 FINDINGS: Brain: Ill-defined slightly hyperattenuating lobular masslike lesions about the corpus callosum,, interventricular septum and fornix with some hypoattenuating, likely vasogenic edema crossing genu of the corpus callosum and deep white matter adjacent the left lateral ventricle as well. Overall, incompletely characterized on the CT imaging. No areas of hyperdense hemorrhage. Some local mass effect and partial effacement of the lateral ventricles is noted. No extra-axial collection or hemorrhage is evident. Chronic mild expansion of the retro cerebellar CSF space  likely reflecting arachnoid cyst versus mega cisterna magna. Symmetric prominence of the ventricles, cisterns and sulci compatible with parenchymal volume loss. Chronic small vessel ischemic changes are similar to priors. Vascular: Atherosclerotic calcification of the carotid siphons and intradural vertebral arteries. No hyperdense vessel. Skull: No calvarial fracture or suspicious osseous lesion. No scalp swelling or hematoma. Sinuses/Orbits: Paranasal sinuses and mastoid air cells are predominantly clear. Included orbital structures are unremarkable. Other: None IMPRESSION: Lobular masslike lesion centered predominantly along the corpus callosum, interventricular septum and fornix with likely surrounding vasogenic edema which crosses the genu of the corpus callosum and is also present the left frontal white matter. Primary differential considerations would include a intracranial neoplasm such as CNS lymphoma or glioblastoma multiforme. Overall, incompletely characterized on this unenhanced head CT. Recommend further evaluation with MRI with contrast. No other acute intracranial abnormality. Background of some mild chronic parenchymal volume loss and microvascular angiopathy changes with intracranial atherosclerosis. These results were called by telephone at the time of interpretation on 08/10/2020 at 4:01 pm to provider [facility] , who verbally acknowledged these results. Electronically Signed   By: Lovena Le M.D.   On: 08/10/2020 16:01   CT Knee Right Wo Contrast  Result Date: 08/10/2020 CLINICAL DATA:  Frequent falls.  Distal femur fracture. EXAM: CT OF THE RIGHT KNEE WITHOUT CONTRAST TECHNIQUE: Multidetector CT imaging of the right knee was performed according to the standard protocol. Multiplanar CT image reconstructions were also generated. COMPARISON:  Radiographs same date. FINDINGS: Bones/Joint/Cartilage Oblique fracture of the distal femoral metadiaphysis is again noted with mild comminution.  This fracture is associated with up to 4.3 cm of medial displacement as well as significant overriding of the fracture fragments. There is no intra-articular extension of the fracture. The patella, proximal tibia and proximal fibula are intact. There are advanced tricompartmental degenerative changes at the knee with large osteophytes and intra-articular loose bodies. There is a small joint effusion. Ligaments Suboptimally assessed by CT. The anterior cruciate ligament is not well visualized. Muscles and Tendons The extensor mechanism is intact. Mild generalized muscular atrophy. Soft tissues Small hematoma adjacent to the fracture. No foreign body, soft tissue emphysema or bone destruction. IMPRESSION: 1. Mildly comminuted and significantly displaced fracture of the distal femoral metadiaphysis as described. No intra-articular extension of the fracture. 2. 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 Electronically Signed   By: Richardean Sale M.D.   On: 08/10/2020 15:47   MR BRAIN W WO CONTRAST  Result Date: 08/10/2020 CLINICAL DATA:  74 year old female with mental status changes, falls. Abnormal noncontrast head today suggesting a pericallosal mass. EXAM: MRI HEAD WITHOUT AND WITH CONTRAST TECHNIQUE: Multiplanar, multiecho pulse sequences of the brain and surrounding structures were obtained without and with intravenous contrast. CONTRAST:  4mL GADAVIST GADOBUTROL 1 MMOL/ML IV SOLN COMPARISON:  Head CT earlier today. Prior head CTs 10/17/2013 and earlier. FINDINGS: Brain: Lobulated, infiltrative masslike T2 and FLAIR hyperintense lesion expanding the body of the corpus callosum, with nodular extension along the septum pellucidum, and contiguous spread in and around the frontal horns of both lateral ventricles. The lesion crosses midline via the body of the corpus callosum, with left predominant centrum semiovale but right predominant  frontal horn regional involvement. Much of the lesion shows mildly to moderately restricted diffusion but relative T2 hyperintensity. There is petechial hemorrhage in the central colo cell and pericallosal white matter portion on SWI (series 12, image 35). Following contrast there is heterogeneous enhancement throughout the affected areas. And there are occasional small discontinuous areas of petechial enhancement (most notably in the splenium of the corpus callosum on series 20, image 13. All told, the lesion encompasses roughly 69 x 51 x 36 mm (AP by transverse by CC). Despite the fairly extensive periventricular configuration, no disseminated ependymal enhancement is identified. However, there is suspicious abnormal leptomeningeal enhancement in both internal auditory canals right greater than left (series 18, image 14). And there is also questionable abnormally increased leptomeningeal enhancement along the ventral brainstem (series 20, image 13). No other abnormal meningeal enhancement. No ventriculomegaly or transependymal edema. No significant intracranial mass effect. Basilar cisterns remain patent. No superimposed restricted diffusion suggestive of acute infarction. No extra-axial collection or acute intracranial hemorrhage. Cervicomedullary junction and pituitary are within normal limits. Vascular: Major intracranial vascular flow voids are preserved, with generalized intracranial artery tortuosity. The major dural venous sinuses are enhancing and appear to be patent. Skull and upper cervical spine: Negative visible cervical spine and spinal cord. Normal bone marrow signal. Sinuses/Orbits: Negative. Other: Scalp and face appear negative. IMPRESSION: 1. 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 CNS lymphoma felt less likely. Area of involvement encompasses 69 x 51 x 36 mm. 2. 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 3. No significant intracranial mass effect. 4. Recommend Neuro-Oncology consultation. Electronically Signed   By: Genevie Ann M.D.   On: 08/10/2020 18:03   DG Chest Portable 1 View  Result Date: 08/10/2020 CLINICAL DATA:  Altered mental status EXAM: PORTABLE CHEST 1 VIEW COMPARISON:  Radiograph 07/28/2020 FINDINGS: Chronically coarsened interstitial changes in the lungs are not significantly changed from comparison studies. These are slightly more pronounced in the left lung than right. No new focal consolidative opacity is seen. No visible pneumothorax or effusion though portion of the left costophrenic sulcus is collimated from view. Cardiac size is within normal limits for portable technique. The aorta is calcified. The remaining cardiomediastinal contours are unremarkable. No acute osseous or soft tissue abnormality. Degenerative changes are present in the imaged spine and shoulders. Telemetry leads and nasal cannula overlie the chest. IMPRESSION: Chronically coarsened interstitial changes in the lungs. No convincing new focal consolidative opacity. Electronically Signed   By: Lovena Le M.D.   On: 08/10/2020 15:08   DG HIP UNILAT WITH PELVIS 2-3 VIEWS RIGHT  Result Date: 08/10/2020 CLINICAL DATA:  Right hip pain. EXAM: DG  HIP (WITH OR WITHOUT PELVIS) 2-3V RIGHT COMPARISON:  None. FINDINGS: There is no evidence of hip fracture or dislocation. There is no evidence of arthropathy or other focal bone abnormality. IMPRESSION: Negative. Electronically Signed   By: Dorise Bullion III M.D   On: 08/10/2020 19:42        Scheduled Meds: . chlorhexidine  60 mL Topical Once  . [MAR Hold] insulin aspart  0-15 Units Subcutaneous TID WC  . [MAR Hold] insulin aspart  0-5 Units Subcutaneous QHS  . povidone-iodine  2 application Topical Once   Continuous  Infusions: . [MAR Hold] cefTRIAXone (ROCEPHIN)  IV    . lactated ringers 10 mL/hr at 08/11/20 0825     LOS: 1 day    Time spent: 38 minutes spent on chart review, discussion with nursing staff, consultants, updating family and interview/physical exam; more than 50% of that time was spent in counseling and/or coordination of care.    Shamarr Faucett J [place] DO [facility] Available via Epic secure chat 7am-7pm After these hours, please refer to coverage provider listed on [email or web address] 08/11/2020, 10:24 AM

## 2020-08-11 NOTE — Plan of Care (Signed)

## 2020-08-11 NOTE — Transfer of Care (Signed)
Immediate Anesthesia Transfer of Care Note  Patient: Susan Odom  Procedure(s) Performed: INTRAMEDULLARY (IM) RETROGRADE FEMORAL NAILING, with biomet cables (Right )  Patient Location: PACU  Anesthesia Type:General  Level of Consciousness: awake and alert   Airway & Oxygen Therapy: Patient Spontanous Breathing and Patient connected to face mask oxygen  Post-op Assessment: Report given to RN and Post -op Vital signs reviewed and stable  Post vital signs: Reviewed and stable  Last Vitals:  Vitals Value Taken Time  BP 153/68 08/11/20 1237  Temp    Pulse 81 08/11/20 1238  Resp 18 08/11/20 1238  SpO2 100 % 08/11/20 1238  Vitals shown include unvalidated device data.  Last Pain:  Vitals:   08/11/20 0734  TempSrc: Oral  PainSc:          Complications: No complications documented.

## 2020-08-11 NOTE — Anesthesia Postprocedure Evaluation (Signed)
Anesthesia Post Note  Patient: Susan Odom  Procedure(s) Performed: INTRAMEDULLARY (IM) RETROGRADE FEMORAL NAILING, with biomet cables (Right )     Patient location during evaluation: PACU Anesthesia Type: General Level of consciousness: sedated and patient cooperative Pain management: pain level controlled Vital Signs Assessment: post-procedure vital signs reviewed and stable Respiratory status: spontaneous breathing Cardiovascular status: stable Anesthetic complications: no   No complications documented.  Last Vitals:  Vitals:   08/11/20 1355 08/11/20 1432  BP: (!) 146/68 (!) 154/63  Pulse: 82 83  Resp: 19   Temp: 36.5 C 36.5 C  SpO2: 98% 94%    Last Pain:  Vitals:   08/11/20 1432  TempSrc: Oral  PainSc:                  Nolon Nations

## 2020-08-11 NOTE — Progress Notes (Addendum)
0800 Pt is A&O x3, she wants her daughter Koleen Celia to sign her consent for surgery. A telephone consent was obtained witnessed by 2 RNs.  Report was given to Robie at short stay. Pt to short stay.  1435 Received pt back from PACU, A&O x3. RLE with ace wrap dry and intact, right knee immobilizer on. Denies pain at this time.

## 2020-08-12 LAB — BASIC METABOLIC PANEL
Anion gap: 12 (ref 5–15)
BUN: 14 mg/dL (ref 8–23)
CO2: 20 mmol/L — ABNORMAL LOW (ref 22–32)
Calcium: 9.3 mg/dL (ref 8.9–10.3)
Chloride: 106 mmol/L (ref 98–111)
Creatinine, Ser: 0.64 mg/dL (ref 0.44–1.00)
GFR calc Af Amer: >60 mL/min (ref >60)
GFR calc non Af Amer: >60 mL/min (ref >60)
Glucose, Bld: 182 mg/dL — ABNORMAL HIGH (ref 70–99)
Potassium: 4.1 mmol/L (ref 3.5–5.1)
Sodium: 138 mmol/L (ref 135–145)

## 2020-08-12 LAB — CBC
HCT: 32.9 % — ABNORMAL LOW (ref 36.0–46.0)
Hemoglobin: 10.8 g/dL — ABNORMAL LOW (ref 12.0–15.0)
MCH: 30.9 pg (ref 26.0–34.0)
MCHC: 32.8 g/dL (ref 30.0–36.0)
MCV: 94.3 fL (ref 80.0–100.0)
Platelets: 161 10*3/uL (ref 150–400)
RBC: 3.49 MIL/uL — ABNORMAL LOW (ref 3.87–5.11)
RDW: 13.8 % (ref 11.5–15.5)
WBC: 8.8 10*3/uL (ref 4.0–10.5)
nRBC: 0 % (ref 0.0–0.2)

## 2020-08-12 LAB — PROTIME-INR
INR: 1.2 (ref 0.8–1.2)
Prothrombin Time: 14.7 seconds (ref 11.4–15.2)

## 2020-08-12 LAB — GLUCOSE, CAPILLARY
Glucose-Capillary: 210 mg/dL — ABNORMAL HIGH (ref 70–99)
Glucose-Capillary: 262 mg/dL — ABNORMAL HIGH (ref 70–99)
Glucose-Capillary: 176 mg/dL — ABNORMAL HIGH (ref 70–99)
Glucose-Capillary: 204 mg/dL — ABNORMAL HIGH (ref 70–99)

## 2020-08-12 NOTE — Progress Notes (Signed)
  Subjective: Patient stable.  Doing well, pain controlled.     Objective: Vital signs in last 24 hours: Temp:  [97.3 F (36.3 C)-98.6 F (37 C)] 98.6 F (37 C) (09/19 0820) Pulse Rate:  [81-98] 98 (09/19 0820) Resp:  [15-21] 16 (09/19 0820) BP: (139-168)/(63-90) 148/69 (09/19 0820) SpO2:  [94 %-99 %] 94 % (09/19 0820) Weight:  [99.6 kg] 99.6 kg (09/19 0500)  Intake/Output from previous day: 09/18 0701 - 09/19 0700 In: 3189.6 [I.V.:2639.6; IV Piggyback:550] Out: 625 [Urine:555; Blood:70] Intake/Output this shift: No intake/output data recorded.  Exam:  Dressings intact without gross blood or drainage DF/PF intact to operative extremity Operative extremity foot warm and well-perfused  Labs: Recent Labs    08/10/20 1405 08/12/20 0648  HGB 13.7 10.8*   Recent Labs    08/10/20 1405 08/12/20 0648  WBC 14.0* 8.8  RBC 4.60 3.49*  HCT 42.6 32.9*  PLT 218 161   Recent Labs    08/10/20 1405 08/12/20 0648  NA 134* 138  K 4.4 4.1  CL 97* 106  CO2 24 20*  BUN 26* 14  CREATININE 0.78 0.64  GLUCOSE 270* 182*  CALCIUM 10.0 9.3   Recent Labs    08/12/20 0648  INR 1.2    Assessment/Plan: Patient stable on POD1 following distal femur fracture fixation.  Plan for TDWB for transfers only status for 2 weeks with transition to WBAT at that time.  Utilize knee immobilizer during transfers. Follow-up with Dr. Alphonzo Severance in clinic in 10-14 days for suture removal.     Gerrianne Scale Sun Wilensky 08/12/2020, 12:09 PM

## 2020-08-12 NOTE — Progress Notes (Signed)
   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 Please assist with GOC/MDM and family support"  HPI:  Per intake H&P --> Susan Odom is a 74 y.o. female with medical history significant of hypertension, diabetes, obesity who presented to the emergency department for difficulties with back pain and inability to walk for 2 weeks time. Over the last 2 weeks she has had 2 visits to the emergency department at [facility]. In regards the right knee she has a distal femur fracture which she received surgery for on 9/18. 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  Today's Discussion (08/12/2020): I met with Susan Odom this morning. She is alert and able to tell me that she is not presently experienced any pain, nausea, dizziness. She shares that she is overall "okay". We discussed the plan for her to work with physical therapy today. We discussed the conversation I had with her daughter yesterday.   Discussed the importance of continued conversation with family and their  medical providers regarding overall plan of care and treatment options, ensuring decisions are within the context of the patients values and GOCs.  Questions and concerns addressed   Subjective Assessment: Vital Signs Vitals:   08/11/20 1945 08/12/20 0500  BP: 139/70 140/90  Pulse: 98 98  Resp: 15   Temp: 98.1 F (36.7 C) 98.6 F (37 C)  SpO2: 95% 95%    Intake/Output Summary (Last 24 hours) at 08/12/2020 0708 Last data filed at 08/12/2020 0500 Gross per 24 hour  Intake 3189.62 ml  Output 625 ml  Net 2564.62 ml   Last Weight  Most recent update: 08/12/2020  5:11 AM   Weight  99.6 kg (219 lb 9.3 oz)           Gen:  Elderly F in  NAD HEENT: Dry mucous membranes CV: Irregular rate and  Regular rhythm, no murmurs rubs or gallops PULM: ON 2LPm [place] clear to auscultation bilaterally. No wheezes/rales/rhonchi  ABD: soft/nontender/nondistended/normal bowel sounds  EXT: RLE w/ ace wrap sensation in tact Neuro: Alert and oriented  SUMMARY OF RECOMMENDATIONS DNAR/DNI  E-MOST Completed, electric copy can be found in [facility]  Family interested to see what tumor biopsy reveals if glioblastoma would wish to pursue hospice  Referral made to [facility] for informational purposes  Ongoing PMT support  Time Spent: 25 Greater than 50% of the time was spent in counseling and coordination of care ______________________________________________________________________________________ [facility] Team Team Cell Phone: [phone] Please utilize secure chat with additional questions, if there is no response within 30 minutes please call the above phone number  Palliative Medicine Team providers are available by phone from 7am to 7pm daily and can be reached through the team cell phone.  Should this patient require assistance outside of these hours, please call the patient's attending physician.

## 2020-08-12 NOTE — Evaluation (Signed)
Physical Therapy Evaluation Patient Details Name: Susan Odom MRN: [id number] DOB: Feb 01, 1946 Today's Date: 08/12/2020   History of Present Illness  Susan Odom is a 74 y.o. female with medical history significant of hypertension, diabetes, obesity presented to ER for evaluation of right knee pain ongoing for 2 weeks leading up to this admission; she went to [facility] ED on 9/14 with back pain.  Had MRI done-which shows degenerative changes.  UA was concerning for UTI therefore she discharged home on Keflex, Percocet and prednisone and recommended outpatient neurosurgery follow-up.  Was seen by neurosurgery  morning of 9/17, due to worsening back pain and difficulty walking since 2 weeks.  At the office: She was not able to stand or bear weight, was extremely in pain,  not able to obtain radiographs therefore she was sent to ER for further evaluation and management.  Daughter reports gradual onset of confusion & cognitive decline recently.  Found to have R distal femur fracture (now s/p ORIF), and imaging concerning for brain lesion, possibly glioblastoma; Orders are for transfers only, TWB RLE; per ortho notes, put KI on RLE for transfers  Clinical Impression   Pt admitted with above diagnosis. Much of pt's information about home and prior level of function taken from chart review; Prior to 9/5, Susan Odom was walking household distances with a RW; Lives with her Children in a single level house with a level entry; Presents to PT with Pain limiting mobility; Requires 2 person Total Assist for bed mobility, and unable to safely perform transfers due to heavy posterior lean/fear of falling; At her current functional level, SNF level of care is certainly appropriate; also noted Palliative Care is on board, and will follow their lead; see below equipment recs if she is to DC home;  Pt currently with functional limitations due to the deficits listed below (see PT Problem List). Pt will benefit from skilled PT  to increase their independence and safety with mobility to allow discharge to the venue listed below.       Follow Up Recommendations SNF; 24 hour assistance; Other (comment) (Noted Palliative Care is on board, and we are considering Hospice services -- will follow their lead for dc planning; If she is to go home, will need extensive assist to meet her mobility and ADL needs)    Equipment Recommendations  Wheelchair (measurements PT);Wheelchair cushion (measurements PT);Hospital bed;Other (comment) (Possibly hoyer lift) Ambulance ride home   Recommendations for Other Services Other (comment) (Will consider OT)     Precautions / Restrictions Precautions Precautions: Fall Required Braces or Orthoses: Knee Immobilizer - Right Knee Immobilizer - Right: Other (comment) (During transfers) Restrictions RLE Weight Bearing: Touchdown weight bearing (for transfers only)      Mobility  Bed Mobility Overal bed mobility: Needs Assistance Bed Mobility: Rolling;Supine to Sit;Sit to Supine Rolling: Total assist;+2 for physical assistance   Supine to sit: Total assist;+2 for physical assistance Sit to supine: Total assist;+2 for physical assistance   General bed mobility comments: Cues for technique; encouraged participation and reaching and holding bedrails with hand over hand assist; Very painful, and requiring total assist for all aspects of bed mobility  Transfers                    Ambulation/Gait                Stairs            Wheelchair Mobility    Modified Rankin (Stroke Patients  Only)       Balance Overall balance assessment: Needs assistance Sitting-balance support: Bilateral upper extremity supported Sitting balance-Leahy Scale: Zero Sitting balance - Comments: REquired min/mod assist to maintain sitting EOB; tending to have antalgic L lean as well as posterior lean                                     Pertinent Vitals/Pain Pain  Assessment: Faces Faces Pain Scale: Hurts whole lot Pain Location: RLE and back Pain Descriptors / Indicators: Grimacing;Guarding;Crying Pain Intervention(s): Repositioned    Home Living Family/patient expects to be discharged to:: Private residence Living Arrangements: Children Available Help at Discharge: Family;Available PRN/intermittently Type of Home: House Home Access: Level entry     Home Layout: One level Home Equipment: Walker - 2 wheels;Cane - single point;Bedside commode;Shower seat Additional Comments: Above information gleaned from chart review    Prior Function Level of Independence: Independent with assistive device(s);Needs assistance   Gait / Transfers Assistance Needed: wit RW  ADL's / Homemaking Assistance Needed: able to cook and clean around home, does not drive. uses RW for all ambulation  Comments: Above taken from recent PT note on 9/5     Hand Dominance   Dominant Hand: Right    Extremity/Trunk Assessment   Upper Extremity Assessment Upper Extremity Assessment: Generalized weakness    Lower Extremity Assessment Lower Extremity Assessment: RLE deficits/detail RLE Deficits / Details: Grossly dec AROM and strength, limited by significant pain post injury and surgery RLE: Unable to fully assess due to pain       Communication   Communication: No difficulties  Cognition Arousal/Alertness: Awake/alert Behavior During Therapy: WFL for tasks assessed/performed Overall Cognitive Status: No family/caregiver present to determine baseline cognitive functioning Area of Impairment: Orientation;Awareness;Problem solving                 Orientation Level: Disoriented to;Place         Awareness: Intellectual Problem Solving: Slow processing;Decreased initiation;Requires verbal cues        General Comments General comments (skin integrity, edema, etc.): Noted pt's foley was leaking; gown changed; Notifed RN    Exercises      Assessment/Plan    PT Assessment Patient needs continued PT services  PT Problem List Decreased strength;Decreased range of motion;Decreased activity tolerance;Decreased balance;Decreased mobility;Decreased coordination;Decreased cognition;Decreased knowledge of use of DME;Decreased safety awareness;Decreased knowledge of precautions;Obesity;Pain       PT Treatment Interventions DME instruction;Functional mobility training;Therapeutic activities;Therapeutic exercise;Balance training;Neuromuscular re-education;Cognitive remediation;Patient/family education;Wheelchair mobility training    PT Goals (Current goals can be found in the Care Plan section)  Acute Rehab PT Goals Patient Stated Goal: Did not state PT Goal Formulation: Patient unable to participate in goal setting Time For Goal Achievement: 08/26/20 Potential to Achieve Goals: Fair    Frequency Min 2X/week   Barriers to discharge Other (comment) Will need more information re: home situation, and to verify 24 hour care    Co-evaluation               AM-PAC PT "6 Clicks" Mobility  Outcome Measure Help needed turning from your back to your side while in a flat bed without using bedrails?: Total Help needed moving from lying on your back to sitting on the side of a flat bed without using bedrails?: Total Help needed moving to and from a bed to a chair (including a wheelchair)?: Total Help needed standing up  from a chair using your arms (e.g., wheelchair or bedside chair)?: Total Help needed to walk in hospital room?: Total Help needed climbing 3-5 steps with a railing? : Total 6 Click Score: 6    End of Session Equipment Utilized During Treatment: Other (comment) (bed pad) Activity Tolerance: Patient limited by pain Patient left: in bed;with call bell/phone within reach;with bed alarm set Nurse Communication: Mobility status PT Visit Diagnosis: Other abnormalities of gait and mobility (R26.89);Pain Pain -  Right/Left: Right Pain - part of body: Leg    Time: 5361-4431 PT Time Calculation (min) (ACUTE ONLY): 27 min   Charges:   PT Evaluation $PT Eval Moderate Complexity: 1 Mod PT Treatments $Therapeutic Activity: 8-22 mins        Roney Marion, PT  Acute Rehabilitation Services Pager [phone] Office [phone]   Colletta [place] 08/12/2020, 6:32 PM

## 2020-08-12 NOTE — Progress Notes (Signed)
   08/12/20 1520  Clinical Encounter Type  Visited With Patient and family together  Visit Type Spiritual support  Referral From Nurse  Consult/Referral To Chaplain  Visit with patient and daughter at bedside, offered prayer.This note was prepared by Jeanine Luz, M.Div..  For questions please contact by phone [phone].

## 2020-08-12 NOTE — Care Management (Signed)
Request received from Palliative team to refer patient to [facility] so they can share information with patient/daughter.  Patient/daughter have said they will make decision about home hospice when biopsy of brain tumor is done and they know definite prognosis.    Case discussed and information faxed to [facility] with [place].  She will contact daughter today.

## 2020-08-12 NOTE — Progress Notes (Signed)
PROGRESS NOTE    Susan Odom  CVE:[id number] DOB: [birth year] DOA: 08/10/2020 PCP: Redmond School, MD    Brief Narrative:  Susan Odom is a 74 year old female with past medical history notable for essential hypertension, diabetes, obesity who presented to the emergency department with right knee pain.  Pain present x2 weeks, but has progressed over the past 24 hours.  Associated with swelling.  Patient reports a fall few days prior.  Additionally, daughter reports gradual onset of confusion and cognitive decline recently.  Patient initially went to [facility], ED on 08/07/2020 with back pain, MRI notable for degenerative changes with UA concerning for UTI and discharged home on Keflex, Percocet and prednisone.  Was seen by neurosurgery with worsening back pain and difficulty walking for 2 weeks.  She was unable to stand or bear weight in the office; and subsequently was sent to the emergency department for further evaluation and management.  In the ED, the [facility] count 14.0, sodium 134, ESR within normal limits, CRP 5.4, Covid-19 PCR negative.  X-ray right hip notable for severe displaced and comminuted oblique fracture distal right femur.  Chest x-ray with no acute findings.  CT right knee with mildly comminuted and slightly displaced fracture distal femoral metadiaphysis.  CT head with lobular masslike lesion predominantly along the corpus callosum with likely surrounding vasogenic edema concerning for intracranial neoplasm such as lymphoma or glioblastoma multiform a.  Neurosurgery and orthopedics were consulted.  TRH consulted for admission.  Assessment & Plan:   Principal Problem:   Femur fracture, right ([place]) Active Problems:   Fall   Hypertension   Leukocytosis   Brain mass   Palliative care by specialist   DNR (do not resuscitate)   Goals of care, counseling/discussion   Femur fracture ([place])   Right femur fracture Patient presenting with right lower extremity pain, difficulty  ambulating.  Was found to have comminuted, displaced fracture distal femoral metadiaphysis on imaging. Patient underwent ORIF with intramedullary retrograde femoral nailing with cables on 08/11/2020 by Dr. Marlou Sa. --Partial weightbearing RLE for transfers only x2 weeks per orthopedics --Oxycodone 5 mg every 4 hours as needed moderate pain --DVT prophylaxis with aspirin 81 mg p.o. 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  CT head without contrast with lobular masslike lesion corpus callosum, intraventricular septum and fornix with likely surrounding vasogenic edema which crosses the genu of the corpus callosum and also present in the left frontal white matter.  MR brain with and without contrast with central callosal, septal, paraventricular infiltrative enhancing tumor most suspicious for glioblastoma/high-grade glioma. --Neurosurgery following, appreciate assistance --Discussed with Dr. Mickeal Skinner, Neuro-oncology on 9/18, who will review her images and make formal consultation on Monday 9/20 --If glioblastoma/high grade glioma, given her age very poor prognosis --Palliative care consulted for assistance with goals of care/medical decision making --Per neurosurgery, may require surgical biopsy; will await further recommendations  Urinary tract infection Urinalysis with large leukoesterase, negative nitrite, many bacteria, greater than 50 WBCs.  Patient with continued dysuria and foul-smelling urine. --Urine culture: With multiple species --Continue ceftriaxone 1 g IV every 24 hours, will treat x3 days  Essential hypertension On amlodipine and losartan at home.  BP 129/63. --Continue to hold home antihypertensives while n.p.o. and blood pressure within normal limits. --Hydralazine IV as needed  Type 2 diabetes mellitus On Invokana outpatient, will hold while inpatient. --Insulin  sliding scale for coverage --CBGs QAC/HS  Leukocytosis Patient afebrile with  a WBC count of 14.0 on admission.  Etiology likely secondary to p.o. steroids started outpatient versus UTI. --Continue antibiotics as above for UTI. --Follow CBC daily   DVT prophylaxis: SCDs, aspirin 81 mg p.o. twice daily per orthopedics Code Status: DNR Family Communication: Updated patient extensively at bedside, updated patient's daughter extensively yesterday evening  Disposition Plan:  Status is: Inpatient  Remains inpatient appropriate because:Ongoing diagnostic testing needed not appropriate for outpatient work up, Unsafe d/c plan, IV treatments appropriate due to intensity of illness or inability to take PO and Inpatient level of care appropriate due to severity of illness   Dispo: The patient is from: Home              Anticipated d/c is to: To be determined              Anticipated d/c date is: > 3 days              Patient currently is not medically stable to d/c.    Consultants:   Orthopedics, Dr. Marlou Sa  Neurosurgery, Dr. Ellene Route  Palliative care  Neuro oncology, Dr. Mickeal Skinner  Procedures:   ORIF with cables and retrograde IMN, Dr. Marlou Sa 9/18  Antimicrobials:   Ceftriaxone 9/17>>   Subjective: Patient seen and examined at bedside, resting comfortably. Pain controlled. Eating breakfast. No specific complaints or concerns this morning. Denies headache, no visual changes, no chest pain, palpitations, no shortness of breath, no abdominal pain, no fever/chills/night sweats, no nausea cefonicid/diarrhea.  No acute concerns per nursing who is present at bedside this afternoon following return from PACU.  Objective: Vitals:   08/11/20 1432 08/11/20 1945 08/12/20 0500 08/12/20 0820  BP: (!) 154/63 139/70 140/90 (!) 148/69  Pulse: 83 98 98 98  Resp:  15  16  Temp: 97.7 F (36.5 C) 98.1 F (36.7 C) 98.6 F (37 C) 98.6 F (37 C)  TempSrc: Oral Axillary Axillary Oral  SpO2: 94% 95% 95%  94%  Weight:   99.6 kg     Intake/Output Summary (Last 24 hours) at 08/12/2020 1129 Last data filed at 08/12/2020 0500 Gross per 24 hour  Intake 1939.62 ml  Output 425 ml  Net 1514.62 ml   Filed Weights   08/12/20 0500  Weight: 99.6 kg    Examination:  General exam: Appears calm and comfortable Respiratory system: Clear to auscultation. Respiratory effort normal. Oxygenating well on room air Cardiovascular system: S1 & S2 heard, RRR. No JVD, murmurs, rubs, gallops or clicks. No pedal edema. Gastrointestinal system: Abdomen is nondistended, soft and nontender. No organomegaly or masses felt. Normal bowel sounds heard. Central nervous system: Alert and oriented. No focal neurological deficits. Extremities: No peripheral edema, surgical dressing/Ace wrap noted in place, clean/dry/intact. Skin: No rashes, lesions or ulcers Psychiatry: Judgement and insight appear poor. Mood & affect appropriate.     Data Reviewed: I have personally reviewed following labs and imaging studies  CBC: Recent Labs  Lab 08/07/20 0729 08/10/20 1405 08/12/20 0648  WBC 11.3* 14.0* 8.8  NEUTROABS 9.1* 11.7*  --   HGB 14.3 13.7 10.8*  HCT 44.5 42.6 32.9*  MCV 94.5 92.6 94.3  PLT 199 218 332   Basic Metabolic Panel: Recent Labs  Lab 08/07/20 0729 08/10/20 1405 08/10/20 1616 08/12/20 0648  NA 135 134*  --  138  K 4.3 4.4  --  4.1  CL 101 97*  --  106  CO2 23 24  --  20*  GLUCOSE 274* 270*  --  182*  BUN 30* 26*  --  14  CREATININE 0.75 0.78  --  0.64  CALCIUM 9.5 10.0  --  9.3  MG  --   --  2.0  --    GFR: Estimated Creatinine Clearance: 80.2 mL/min (by C-G formula based on SCr of 0.64 mg/dL). Liver Function Tests: Recent Labs  Lab 08/07/20 0729 08/10/20 1405  AST 13* 16  ALT 17 16  ALKPHOS 61 75  BILITOT 1.5* 1.4*  PROT 6.6 6.8  ALBUMIN 3.4* 3.3*   No results for input(s): LIPASE, AMYLASE in the last 168 hours. No results for input(s): AMMONIA in the last 168  hours. Coagulation Profile: Recent Labs  Lab 08/12/20 0648  INR 1.2   Cardiac Enzymes: No results for input(s): CKTOTAL, CKMB, CKMBINDEX, TROPONINI in the last 168 hours. BNP (last 3 results) No results for input(s): PROBNP in the last 8760 hours. HbA1C: Recent Labs    08/10/20 1759  HGBA1C 7.3*   CBG: Recent Labs  Lab 08/11/20 0819 08/11/20 1239 08/11/20 1640 08/11/20 1937 08/12/20 0636  GLUCAP 223* 193* 179* 208* 210*   Lipid Profile: No results for input(s): CHOL, HDL, LDLCALC, TRIG, CHOLHDL, LDLDIRECT in the last 72 hours. Thyroid Function Tests: No results for input(s): TSH, T4TOTAL, FREET4, T3FREE, THYROIDAB in the last 72 hours. Anemia Panel: No results for input(s): VITAMINB12, FOLATE, FERRITIN, TIBC, IRON, RETICCTPCT in the last 72 hours. Sepsis Labs: No results for input(s): PROCALCITON, LATICACIDVEN in the last 168 hours.  Recent Results (from the past 240 hour(s))  Urine Culture     Status: Abnormal   Collection Time: 08/07/20  7:22 AM   Specimen: Urine, Random  Result Value Ref Range Status   Specimen Description   Final    URINE, RANDOM Performed at [facility], [address] [place], [place] [address]    Special Requests   Final    NONE Performed at [facility], [address], [place], [place] [address]    Culture MULTIPLE SPECIES PRESENT, SUGGEST RECOLLECTION (A)  Final   Report Status 08/08/2020 FINAL  Final  SARS Coronavirus 2 by RT PCR (hospital order, performed in [facility] hospital lab) Nasopharyngeal Nasopharyngeal Swab     Status: None   Collection Time: 08/07/20 10:03 AM   Specimen: Nasopharyngeal Swab  Result Value Ref Range Status   SARS Coronavirus 2 NEGATIVE NEGATIVE Final    Comment: (NOTE) SARS-CoV-2 target nucleic acids are NOT DETECTED.  The SARS-CoV-2 RNA is generally detectable in upper and lower respiratory specimens during the acute phase of infection. The lowest concentration of SARS-CoV-2 viral copies this assay  can detect is 250 copies / mL. A negative result does not preclude SARS-CoV-2 infection and should not be used as the sole basis for treatment or other patient management decisions.  A negative result may occur with improper specimen collection / handling, submission of specimen other than nasopharyngeal swab, presence of viral mutation(s) within the areas targeted by this assay, and inadequate number of viral copies (<250 copies / mL). A negative result must be combined with clinical observations, patient history, and epidemiological information.  Fact Sheet for Patients:   StrictlyIdeas.no  Fact Sheet for Healthcare Providers: BankingDealers.co.za  This test is not yet approved or  cleared by the [place] FDA and has been authorized for detection and/or diagnosis of SARS-CoV-2 by FDA under an Emergency Use Authorization (EUA).  This EUA will remain in effect (meaning this test can be used) for the duration of the  COVID-19 declaration under Section 564(b)(1) of the Act, 21 U.S.C. section 360bbb-3(b)(1), unless the authorization is terminated or revoked sooner.  Performed at [facility], [address], [place], [place] [address]   SARS Coronavirus 2 by RT PCR (hospital order, performed in [facility] hospital lab) Nasopharyngeal Nasopharyngeal Swab     Status: None   Collection Time: 08/10/20  2:33 PM   Specimen: Nasopharyngeal Swab  Result Value Ref Range Status   SARS Coronavirus 2 NEGATIVE NEGATIVE Final    Comment: (NOTE) SARS-CoV-2 target nucleic acids are NOT DETECTED.  The SARS-CoV-2 RNA is generally detectable in upper and lower respiratory specimens during the acute phase of infection. The lowest concentration of SARS-CoV-2 viral copies this assay can detect is 250 copies / mL. A negative result does not preclude SARS-CoV-2 infection and should not be used as the sole basis for treatment or other patient management  decisions.  A negative result may occur with improper specimen collection / handling, submission of specimen other than nasopharyngeal swab, presence of viral mutation(s) within the areas targeted by this assay, and inadequate number of viral copies (<250 copies / mL). A negative result must be combined with clinical observations, patient history, and epidemiological information.  Fact Sheet for Patients:   StrictlyIdeas.no  Fact Sheet for Healthcare Providers: BankingDealers.co.za  This test is not yet approved or  cleared by the [place] FDA and has been authorized for detection and/or diagnosis of SARS-CoV-2 by FDA under an Emergency Use Authorization (EUA).  This EUA will remain in effect (meaning this test can be used) for the duration of the COVID-19 declaration under Section 564(b)(1) of the Act, 21 U.S.C. section 360bbb-3(b)(1), unless the authorization is terminated or revoked sooner.  Performed at [facility], [place] [address], [place], [place] [address]   MRSA PCR Screening     Status: None   Collection Time: 08/11/20  5:28 AM   Specimen: Nasal Mucosa; Nasopharyngeal  Result Value Ref Range Status   MRSA by PCR NEGATIVE NEGATIVE Final    Comment:        The GeneXpert MRSA Assay (FDA approved for NASAL specimens only), is one component of a comprehensive MRSA colonization surveillance program. It is not intended to diagnose MRSA infection nor to guide or monitor treatment for MRSA infections. Performed at [facility], [place] [address], [place], [place] [address]          Radiology Studies: DG Knee 1-2 Views Right  Result Date: 08/10/2020 CLINICAL DATA:  Right knee pain after multiple falls. EXAM: RIGHT KNEE - 1-2 VIEW COMPARISON:  None. FINDINGS: Severely displaced and comminuted oblique fracture is seen involving the distal right femur. There is slight overriding of the fracture fragments.  Degenerative changes are seen involving the right knee. IMPRESSION: Severely displaced and comminuted oblique fracture of distal right femur. Electronically Signed   By: Marijo Conception M.D.   On: 08/10/2020 13:24   DG Ankle Complete Left  Result Date: 08/10/2020 CLINICAL DATA:  Pain EXAM: LEFT ANKLE COMPLETE - 3+ VIEW COMPARISON:  None. FINDINGS: Bilateral soft tissue swelling.  No fracture or dislocation. IMPRESSION: Bilateral soft tissue swelling without fracture dislocation. Electronically Signed   By: Dorise Bullion III M.D   On: 08/10/2020 19:48   DG Ankle Complete Right  Result Date: 08/11/2020 CLINICAL DATA:  Right hip pain. EXAM: RIGHT ANKLE - COMPLETE 3+ VIEW COMPARISON:  None. FINDINGS: Significant lateral soft tissue swelling identified. No displaced fractures are identified.  There is a subtle lucency in the distal fibula medially which is favored to represent a prominent nutrient foramen. This lucency does not extend through the shaft of the distal fibula. The first lateral view was limited as the patient had a dressing noted posteriorly partially obscuring the posterior distal fibula. A repeat film was then obtained without the dressing. No definitive fractures are seen on today's study. Degenerative changes are seen in the mid and hindfoot. IMPRESSION: No convincing evidence of fracture. Degenerative changes in the mid and hindfoot. Electronically Signed   By: Dorise Bullion III M.D   On: 08/11/2020 09:50   CT Head Wo Contrast  Result Date: 08/10/2020 CLINICAL DATA:  Mental status change, history of falls EXAM: CT HEAD WITHOUT CONTRAST TECHNIQUE: Contiguous axial images were obtained from the base of the skull through the vertex without intravenous contrast. COMPARISON:  CT 10/17/2013 FINDINGS: Brain: Ill-defined slightly hyperattenuating lobular masslike lesions about the corpus callosum,, interventricular septum and fornix with some hypoattenuating, likely vasogenic edema crossing genu  of the corpus callosum and deep white matter adjacent the left lateral ventricle as well. Overall, incompletely characterized on the CT imaging. No areas of hyperdense hemorrhage. Some local mass effect and partial effacement of the lateral ventricles is noted. No extra-axial collection or hemorrhage is evident. Chronic mild expansion of the retro cerebellar CSF space likely reflecting arachnoid cyst versus mega cisterna magna. Symmetric prominence of the ventricles, cisterns and sulci compatible with parenchymal volume loss. Chronic small vessel ischemic changes are similar to priors. Vascular: Atherosclerotic calcification of the carotid siphons and intradural vertebral arteries. No hyperdense vessel. Skull: No calvarial fracture or suspicious osseous lesion. No scalp swelling or hematoma. Sinuses/Orbits: Paranasal sinuses and mastoid air cells are predominantly clear. Included orbital structures are unremarkable. Other: None IMPRESSION: Lobular masslike lesion centered predominantly along the corpus callosum, interventricular septum and fornix with likely surrounding vasogenic edema which crosses the genu of the corpus callosum and is also present the left frontal white matter. Primary differential considerations would include a intracranial neoplasm such as CNS lymphoma or glioblastoma multiforme. Overall, incompletely characterized on this unenhanced head CT. Recommend further evaluation with MRI with contrast. No other acute intracranial abnormality. Background of some mild chronic parenchymal volume loss and microvascular angiopathy changes with intracranial atherosclerosis. These results were called by telephone at the time of interpretation on 08/10/2020 at 4:01 pm to provider [facility] , who verbally acknowledged these results. Electronically Signed   By: Lovena Le M.D.   On: 08/10/2020 16:01   CT Knee Right Wo Contrast  Result Date: 08/10/2020 CLINICAL DATA:  Frequent falls.  Distal femur  fracture. EXAM: CT OF THE RIGHT KNEE WITHOUT CONTRAST TECHNIQUE: Multidetector CT imaging of the right knee was performed according to the standard protocol. Multiplanar CT image reconstructions were also generated. COMPARISON:  Radiographs same date. FINDINGS: Bones/Joint/Cartilage Oblique fracture of the distal femoral metadiaphysis is again noted with mild comminution. This fracture is associated with up to 4.3 cm of medial displacement as well as significant overriding of the fracture fragments. There is no intra-articular extension of the fracture. The patella, proximal tibia and proximal fibula are intact. There are advanced tricompartmental degenerative changes at the knee with large osteophytes and intra-articular loose bodies. There is a small joint effusion. Ligaments Suboptimally assessed by CT. The anterior cruciate ligament is not well visualized. Muscles and Tendons The extensor mechanism is intact. Mild generalized muscular atrophy. Soft tissues Small hematoma adjacent to the fracture. No foreign body, soft  tissue emphysema or bone destruction. IMPRESSION: 1. Mildly comminuted and significantly displaced fracture of the distal femoral metadiaphysis as described. No intra-articular extension of the fracture. 2. 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 Electronically Signed   By: Richardean Sale M.D.   On: 08/10/2020 15:47   MR BRAIN W WO CONTRAST  Result Date: 08/10/2020 CLINICAL DATA:  74 year old female with mental status changes, falls. Abnormal noncontrast head today suggesting a pericallosal mass. EXAM: MRI HEAD WITHOUT AND WITH CONTRAST TECHNIQUE: Multiplanar, multiecho pulse sequences of the brain and surrounding structures were obtained without and with intravenous contrast. CONTRAST:  49mL GADAVIST GADOBUTROL 1 MMOL/ML IV SOLN COMPARISON:  Head CT earlier today. Prior head CTs 10/17/2013 and earlier. FINDINGS:  Brain: Lobulated, infiltrative masslike T2 and FLAIR hyperintense lesion expanding the body of the corpus callosum, with nodular extension along the septum pellucidum, and contiguous spread in and around the frontal horns of both lateral ventricles. The lesion crosses midline via the body of the corpus callosum, with left predominant centrum semiovale but right predominant frontal horn regional involvement. Much of the lesion shows mildly to moderately restricted diffusion but relative T2 hyperintensity. There is petechial hemorrhage in the central colo cell and pericallosal white matter portion on SWI (series 12, image 35). Following contrast there is heterogeneous enhancement throughout the affected areas. And there are occasional small discontinuous areas of petechial enhancement (most notably in the splenium of the corpus callosum on series 20, image 13. All told, the lesion encompasses roughly 69 x 51 x 36 mm (AP by transverse by CC). Despite the fairly extensive periventricular configuration, no disseminated ependymal enhancement is identified. However, there is suspicious abnormal leptomeningeal enhancement in both internal auditory canals right greater than left (series 18, image 14). And there is also questionable abnormally increased leptomeningeal enhancement along the ventral brainstem (series 20, image 13). No other abnormal meningeal enhancement. No ventriculomegaly or transependymal edema. No significant intracranial mass effect. Basilar cisterns remain patent. No superimposed restricted diffusion suggestive of acute infarction. No extra-axial collection or acute intracranial hemorrhage. Cervicomedullary junction and pituitary are within normal limits. Vascular: Major intracranial vascular flow voids are preserved, with generalized intracranial artery tortuosity. The major dural venous sinuses are enhancing and appear to be patent. Skull and upper cervical spine: Negative visible cervical spine and  spinal cord. Normal bone marrow signal. Sinuses/Orbits: Negative. Other: Scalp and face appear negative. IMPRESSION: 1. 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 CNS lymphoma felt less likely. Area of involvement encompasses 69 x 51 x 36 mm. 2. 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 3. No significant intracranial mass effect. 4. Recommend Neuro-Oncology consultation. Electronically Signed   By: Genevie Ann M.D.   On: 08/10/2020 18:03   DG Chest Portable 1 View  Result Date: 08/10/2020 CLINICAL DATA:  Altered mental status EXAM: PORTABLE CHEST 1 VIEW COMPARISON:  Radiograph 07/28/2020 FINDINGS: Chronically coarsened interstitial changes in the lungs are not significantly changed from comparison studies. These are slightly more pronounced in the left lung than right. No new focal consolidative opacity is seen. No visible pneumothorax or effusion though portion of the left costophrenic sulcus is collimated from view. Cardiac size is within normal limits for portable technique. The aorta is calcified. The remaining cardiomediastinal contours are unremarkable. No acute osseous or soft tissue  abnormality. Degenerative changes are present in the imaged spine and shoulders. Telemetry leads and nasal cannula overlie the chest. IMPRESSION: Chronically coarsened interstitial changes in the lungs. No convincing new focal consolidative opacity. Electronically Signed   By: Lovena Le M.D.   On: 08/10/2020 15:08   DG C-Arm 1-60 Min  Result Date: 08/11/2020 CLINICAL DATA:  Intramedullary nail placement. EXAM: RIGHT FEMUR 2 VIEWS; DG C-ARM 1-60 MIN COMPARISON:  08/10/2020 FINDINGS: Placement of right intramedullary femoral nail with proximal and distal anchoring screws. Hardware is  intact as there is anatomic alignment over patient's distal femoral diametaphyseal fracture. Remainder the exam is unchanged. IMPRESSION: Internal fixation of distal femoral diametaphyseal fracture in anatomic alignment. Hardware intact and in adequate position. Electronically Signed   By: Marin Olp M.D.   On: 08/11/2020 13:45   DG HIP UNILAT WITH PELVIS 2-3 VIEWS RIGHT  Result Date: 08/10/2020 CLINICAL DATA:  Right hip pain. EXAM: DG HIP (WITH OR WITHOUT PELVIS) 2-3V RIGHT COMPARISON:  None. FINDINGS: There is no evidence of hip fracture or dislocation. There is no evidence of arthropathy or other focal bone abnormality. IMPRESSION: Negative. Electronically Signed   By: Dorise Bullion III M.D   On: 08/10/2020 19:42   DG FEMUR 1V RIGHT  Result Date: 08/11/2020 CLINICAL DATA:  Fixation of right femoral fracture. EXAM: RIGHT FEMUR 1 VIEW COMPARISON:  08/10/2020 FINDINGS: Examination demonstrates evidence of patient's right femoral intramedullary nail ridging known distal femoral diametaphyseal fracture. There are proximal and distal anchoring screws. Hardware is intact with anatomic alignment over the fracture site. Exam is otherwise unchanged. IMPRESSION: Fixation of right femoral diametaphyseal fracture with hardware intact and anatomic alignment over the fracture site. Electronically Signed   By: Marin Olp M.D.   On: 08/11/2020 13:48   DG FEMUR, MIN 2 VIEWS RIGHT  Result Date: 08/11/2020 CLINICAL DATA:  Intramedullary nail placement. EXAM: RIGHT FEMUR 2 VIEWS; DG C-ARM 1-60 MIN COMPARISON:  08/10/2020 FINDINGS: Placement of right intramedullary femoral nail with proximal and distal anchoring screws. Hardware is intact as there is anatomic alignment over patient's distal femoral diametaphyseal fracture. Remainder the exam is unchanged. IMPRESSION: Internal fixation of distal femoral diametaphyseal fracture in anatomic alignment. Hardware intact and in adequate position. Electronically Signed    By: Marin Olp M.D.   On: 08/11/2020 13:45        Scheduled Meds: . aspirin EC  81 mg Oral BID  . Chlorhexidine Gluconate Cloth  6 each Topical Daily  . docusate sodium  100 mg Oral BID  . insulin aspart  0-15 Units Subcutaneous TID WC  . insulin aspart  0-5 Units Subcutaneous QHS   Continuous Infusions: . cefTRIAXone (ROCEPHIN)  IV 1 g (08/11/20 1646)     LOS: 2 days    Time spent: 36 minutes spent on chart review, discussion with nursing staff, consultants, updating family and interview/physical exam; more than 50% of that time was spent in counseling and/or coordination of care.    Makhayla Mcmurry J [place] DO [facility] Available via Epic secure chat 7am-7pm After these hours, please refer to coverage provider listed on [email or web address] 08/12/2020, 11:29 AM

## 2020-08-13 ENCOUNTER — Other Ambulatory Visit: Payer: Self-pay | Admitting: Neurological Surgery

## 2020-08-13 ENCOUNTER — Inpatient Hospital Stay (HOSPITAL_COMMUNITY): Payer: Medicare Other

## 2020-08-13 ENCOUNTER — Encounter (HOSPITAL_COMMUNITY): Payer: Medicare Other

## 2020-08-13 ENCOUNTER — Encounter (HOSPITAL_COMMUNITY): Payer: Self-pay | Admitting: Orthopedic Surgery

## 2020-08-13 DIAGNOSIS — D72829 Elevated white blood cell count, unspecified: Secondary | ICD-10-CM

## 2020-08-13 LAB — PROTIME-INR
INR: 1.2 (ref 0.8–1.2)
Prothrombin Time: 14.9 seconds (ref 11.4–15.2)

## 2020-08-13 LAB — CBC
HCT: 31.6 % — ABNORMAL LOW (ref 36.0–46.0)
Hemoglobin: 10.2 g/dL — ABNORMAL LOW (ref 12.0–15.0)
MCH: 29.8 pg (ref 26.0–34.0)
MCHC: 32.3 g/dL (ref 30.0–36.0)
MCV: 92.4 fL (ref 80.0–100.0)
Platelets: 152 10*3/uL (ref 150–400)
RBC: 3.42 MIL/uL — ABNORMAL LOW (ref 3.87–5.11)
RDW: 13.8 % (ref 11.5–15.5)
WBC: 7.4 10*3/uL (ref 4.0–10.5)
nRBC: 0.3 % — ABNORMAL HIGH (ref 0.0–0.2)

## 2020-08-13 LAB — GLUCOSE, CAPILLARY
Glucose-Capillary: 181 mg/dL — ABNORMAL HIGH (ref 70–99)
Glucose-Capillary: 199 mg/dL — ABNORMAL HIGH (ref 70–99)
Glucose-Capillary: 164 mg/dL — ABNORMAL HIGH (ref 70–99)
Glucose-Capillary: 167 mg/dL — ABNORMAL HIGH (ref 70–99)

## 2020-08-13 LAB — BASIC METABOLIC PANEL
Anion gap: 10 (ref 5–15)
BUN: 10 mg/dL (ref 8–23)
CO2: 25 mmol/L (ref 22–32)
Calcium: 9.3 mg/dL (ref 8.9–10.3)
Chloride: 103 mmol/L (ref 98–111)
Creatinine, Ser: 0.48 mg/dL (ref 0.44–1.00)
GFR calc Af Amer: >60 mL/min (ref >60)
GFR calc non Af Amer: >60 mL/min (ref >60)
Glucose, Bld: 188 mg/dL — ABNORMAL HIGH (ref 70–99)
Potassium: 3.4 mmol/L — ABNORMAL LOW (ref 3.5–5.1)
Sodium: 138 mmol/L (ref 135–145)

## 2020-08-13 MED ORDER — POTASSIUM CHLORIDE CRYS ER 20 MEQ PO TBCR
40.0000 meq | EXTENDED_RELEASE_TABLET | Freq: Once | ORAL | Status: AC
  Administered 2020-08-13: 40 meq via ORAL
  Filled 2020-08-13: qty 2

## 2020-08-13 MED ORDER — AMLODIPINE BESYLATE 10 MG PO TABS
10.0000 mg | ORAL_TABLET | Freq: Every day | ORAL | Status: DC
  Administered 2020-08-13 – 2020-08-21 (×9): 10 mg via ORAL
  Filled 2020-08-13 (×3): qty 1
  Filled 2020-08-13: qty 2
  Filled 2020-08-13 (×5): qty 1

## 2020-08-13 MED ORDER — GADOBUTROL 1 MMOL/ML IV SOLN
10.0000 mL | Freq: Once | INTRAVENOUS | Status: AC | PRN
  Administered 2020-08-13: 10 mL via INTRAVENOUS

## 2020-08-13 NOTE — Plan of Care (Signed)
  Problem: Nutrition: Goal: Adequate nutrition will be maintained Outcome: Progressing   Problem: Coping: Goal: Level of anxiety will decrease Outcome: Progressing   Problem: Pain Managment: Goal: General experience of comfort will improve Outcome: Progressing   Problem: Safety: Goal: Ability to remain free from injury will improve Outcome: Progressing   

## 2020-08-13 NOTE — Progress Notes (Signed)
Patient stable.  No significant complaints of increased RLE pain.  Dressings intact.  DF/PF intact.  1+  DP pulse of RLE.  Continue TDWB with knee immobilizer. Follow-up with Dr. Marlou Sa in 10-14 days.

## 2020-08-13 NOTE — Progress Notes (Signed)
Patient ID: Susan Odom, female   DOB: 1946-09-26, 74 y.o.   MRN: [id number] Patient was discussed in Brain tumor conference this morning. She remains confused clinically but moving all four extremities and is somewhat aware of her situation. She will need brain biopsy for diagnosis . 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 Will schedule brain bx for tomorrow afternoon. Discussed by phone with daughter who will provide consent.

## 2020-08-13 NOTE — Progress Notes (Signed)
PROGRESS NOTE    Susan Odom  NIO:[id number] DOB: 26-Sep-1946 DOA: 08/10/2020 PCP: Redmond School, MD    Brief Narrative:  Susan Odom is a 74 year old female with past medical history notable for essential hypertension, diabetes, obesity who presented to the emergency department with right knee pain.  Pain present x2 weeks, but has progressed over the past 24 hours.  Associated with swelling.  Patient reports a fall few days prior.  Additionally, daughter reports gradual onset of confusion and cognitive decline recently.  Patient initially went to [facility], ED on 08/07/2020 with back pain, MRI notable for degenerative changes with UA concerning for UTI and discharged home on Keflex, Percocet and prednisone.  Was seen by neurosurgery with worsening back pain and difficulty walking for 2 weeks.  She was unable to stand or bear weight in the office; and subsequently was sent to the emergency department for further evaluation and management.  In the ED, the [facility] count 14.0, sodium 134, ESR within normal limits, CRP 5.4, Covid-19 PCR negative.  X-ray right hip notable for severe displaced and comminuted oblique fracture distal right femur.  Chest x-ray with no acute findings.  CT right knee with mildly comminuted and slightly displaced fracture distal femoral metadiaphysis.  CT head with lobular masslike lesion predominantly along the corpus callosum with likely surrounding vasogenic edema concerning for intracranial neoplasm such as lymphoma or glioblastoma multiform a.  Neurosurgery and orthopedics were consulted.  TRH consulted for admission.  Assessment & Plan:   Principal Problem:   Femur fracture, right ([name]) Active Problems:   Fall   Hypertension   Leukocytosis   Brain mass   Palliative care by specialist   DNR (do not resuscitate)   Goals of care, counseling/discussion   Femur fracture ([name])   Right femur fracture Patient presenting with right lower extremity pain, difficulty  ambulating.  Was found to have comminuted, displaced fracture distal femoral metadiaphysis on imaging. Patient underwent ORIF with intramedullary retrograde femoral nailing with cables on 08/11/2020 by Dr. Marlou Sa. --Partial weightbearing RLE for transfers only x2 weeks then weightbearing as tolerated per orthopedics --Oxycodone 5 mg every 4 hours as needed moderate pain --DVT prophylaxis with aspirin 81 mg p.o. 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  CT head without contrast with lobular masslike lesion corpus callosum, intraventricular septum and fornix with likely surrounding vasogenic edema which crosses the genu of the corpus callosum and also present in the left frontal white matter.  MR brain with and without contrast with central callosal, septal, paraventricular infiltrative enhancing tumor most suspicious for glioblastoma multiforming/high-grade glioma vs lymphoma. --Neurosurgery, neuro oncology and palliative care following, appreciate assistance --Neurosurgery plans stereotactic brain biopsy on 08/14/2020  Urinary tract infection Urinalysis with large leukoesterase, negative nitrite, many bacteria, greater than 50 WBCs.  Patient with continued dysuria and foul-smelling urine. --Urine culture: With multiple species --Continue ceftriaxone 1 g IV every 24 hours, will treat x 3 days  Hypokalemia Potassium 3.4, will replete --Repeat electrolytes in a.m. to include magnesium  Essential hypertension On amlodipine and losartan at home.  BP 150/70 this a.m. --Restart home amlodipine 10 mg p.o. daily today --Continue to hold home losartan --Hydralazine IV as needed  Type 2 diabetes mellitus On Invokana outpatient, will hold while inpatient. --Insulin sliding scale for coverage --CBGs QAC/HS  Leukocytosis Patient afebrile with a WBC count of 14.0 on admission.   Etiology likely secondary to p.o. steroids  started outpatient versus UTI. --Continue antibiotics as above for UTI. --Follow CBC daily   DVT prophylaxis: SCDs, aspirin 81 mg p.o. twice daily per orthopedics Code Status: DNR Family Communication: Updated patient extensively at bedside  Disposition Plan:  Status is: Inpatient  Remains inpatient appropriate because:Ongoing diagnostic testing needed not appropriate for outpatient work up, Unsafe d/c plan, IV treatments appropriate due to intensity of illness or inability to take PO and Inpatient level of care appropriate due to severity of illness pending brain biopsy scheduled for tomorrow   Dispo: The patient is from: Home              Anticipated d/c is to: To be determined              Anticipated d/c date is: > 3 days              Patient currently is not medically stable to d/c.    Consultants:   Orthopedics, Dr. Marlou Sa  Neurosurgery, Dr. Ellene Route  Palliative care  Neuro oncology, Dr. Mickeal Skinner  Procedures:   ORIF with cables and retrograde IMN, Dr. Marlou Sa 9/18  Brain biopsy pending on 08/13/2020  Antimicrobials:   Ceftriaxone 9/17>>   Subjective: Patient seen and examined at bedside, resting comfortably. Pain controlled. Eating breakfast. No specific complaints or concerns this morning. Denies headache, no visual changes, no chest pain, palpitations, no shortness of breath, no abdominal pain, no fever/chills/night sweats, no nausea/vomiting/diarrhea. No acute events overnight per nursing staff.  Objective: Vitals:   08/12/20 1926 08/13/20 0500 08/13/20 0745 08/13/20 1040  BP: (!) 153/64 (!) 150/70 (!) 159/73 (!) 159/73  Pulse: 89 85 89 89  Resp: $Remo'17 17 15 15  'eRmPW$ Temp: 98.9 F (37.2 C) 98.7 F (37.1 C) 98.2 F (36.8 C) 98.2 F (36.8 C)  TempSrc: Oral Oral Oral Oral  SpO2: 95% 97% (!) 89%   Weight:  99.8 kg  99.8 kg  Height:    '5\' 11"'$  (1.803 m)    Intake/Output Summary (Last 24 hours) at 08/13/2020 1215 Last data filed  at 08/13/2020 0900 Gross per 24 hour  Intake 240 ml  Output 875 ml  Net -635 ml   Filed Weights   08/12/20 0500 08/13/20 0500 08/13/20 1040  Weight: 99.6 kg 99.8 kg 99.8 kg    Examination:  General exam: Appears calm and comfortable Respiratory system: Clear to auscultation. Respiratory effort normal. Oxygenating well on room air Cardiovascular system: S1 & S2 heard, RRR. No JVD, murmurs, rubs, gallops or clicks. No pedal edema. Gastrointestinal system: Abdomen is nondistended, soft and nontender. No organomegaly or masses felt. Normal bowel sounds heard. Central nervous system: Alert and oriented. No focal neurological deficits. Extremities: No peripheral edema, surgical dressing noted in place, clean/dry/intact Skin: No rashes, lesions or ulcers Psychiatry: Judgement and insight appear poor. Mood & affect appropriate.     Data Reviewed: I have personally reviewed following labs and imaging studies  CBC: Recent Labs  Lab 08/07/20 0729 08/10/20 1405 08/12/20 0648 08/13/20 0150  WBC 11.3* 14.0* 8.8 7.4  NEUTROABS 9.1* 11.7*  --   --   HGB 14.3 13.7 10.8* 10.2*  HCT 44.5 42.6 32.9* 31.6*  MCV 94.5 92.6 94.3 92.4  PLT 199 218 161 782   Basic Metabolic Panel: Recent Labs  Lab 08/07/20 0729 08/10/20 1405 08/10/20 1616 08/12/20 0648 08/13/20 0150  NA 135 134*  --  138 138  K 4.3 4.4  --  4.1 3.4*  CL 101 97*  --  106  103  CO2 23 24  --  20* 25  GLUCOSE 274* 270*  --  182* 188*  BUN 30* 26*  --  14 10  CREATININE 0.75 0.78  --  0.64 0.48  CALCIUM 9.5 10.0  --  9.3 9.3  MG  --   --  2.0  --   --    GFR: Estimated Creatinine Clearance: 80.3 mL/min (by C-G formula based on SCr of 0.48 mg/dL). Liver Function Tests: Recent Labs  Lab 08/07/20 0729 08/10/20 1405  AST 13* 16  ALT 17 16  ALKPHOS 61 75  BILITOT 1.5* 1.4*  PROT 6.6 6.8  ALBUMIN 3.4* 3.3*   No results for input(s): LIPASE, AMYLASE in the last 168 hours. No results for input(s): AMMONIA in the last  168 hours. Coagulation Profile: Recent Labs  Lab 08/12/20 0648 08/13/20 0150  INR 1.2 1.2   Cardiac Enzymes: No results for input(s): CKTOTAL, CKMB, CKMBINDEX, TROPONINI in the last 168 hours. BNP (last 3 results) No results for input(s): PROBNP in the last 8760 hours. HbA1C: Recent Labs    08/10/20 1759  HGBA1C 7.3*   CBG: Recent Labs  Lab 08/12/20 1140 08/12/20 1611 08/12/20 1922 08/13/20 0636 08/13/20 1105  GLUCAP 262* 176* 204* 181* 199*   Lipid Profile: No results for input(s): CHOL, HDL, LDLCALC, TRIG, CHOLHDL, LDLDIRECT in the last 72 hours. Thyroid Function Tests: No results for input(s): TSH, T4TOTAL, FREET4, T3FREE, THYROIDAB in the last 72 hours. Anemia Panel: No results for input(s): VITAMINB12, FOLATE, FERRITIN, TIBC, IRON, RETICCTPCT in the last 72 hours. Sepsis Labs: No results for input(s): PROCALCITON, LATICACIDVEN in the last 168 hours.  Recent Results (from the past 240 hour(s))  Urine Culture     Status: Abnormal   Collection Time: 08/07/20  7:22 AM   Specimen: Urine, Random  Result Value Ref Range Status   Specimen Description   Final    URINE, RANDOM Performed at [facility], [address], [place], [place] [address]    Special Requests   Final    NONE Performed at [facility], [address] [place], [place] [address]    Culture MULTIPLE SPECIES PRESENT, SUGGEST RECOLLECTION (A)  Final   Report Status 08/08/2020 FINAL  Final  SARS Coronavirus 2 by RT PCR (hospital order, performed in [facility] hospital lab) Nasopharyngeal Nasopharyngeal Swab     Status: None   Collection Time: 08/07/20 10:03 AM   Specimen: Nasopharyngeal Swab  Result Value Ref Range Status   SARS Coronavirus 2 NEGATIVE NEGATIVE Final    Comment: (NOTE) SARS-CoV-2 target nucleic acids are NOT DETECTED.  The SARS-CoV-2 RNA is generally detectable in upper and lower respiratory specimens during the acute phase of infection. The lowest concentration of SARS-CoV-2  viral copies this assay can detect is 250 copies / mL. A negative result does not preclude SARS-CoV-2 infection and should not be used as the sole basis for treatment or other patient management decisions.  A negative result may occur with improper specimen collection / handling, submission of specimen other than nasopharyngeal swab, presence of viral mutation(s) within the areas targeted by this assay, and inadequate number of viral copies (<250 copies / mL). A negative result must be combined with clinical observations, patient history, and epidemiological information.  Fact Sheet for Patients:   StrictlyIdeas.no  Fact Sheet for Healthcare Providers: BankingDealers.co.za  This test is not yet approved or  cleared by the [place] FDA and has been authorized for detection and/or diagnosis of  SARS-CoV-2 by FDA under an Emergency Use Authorization (EUA).  This EUA will remain in effect (meaning this test can be used) for the duration of the COVID-19 declaration under Section 564(b)(1) of the Act, 21 U.S.C. section 360bbb-3(b)(1), unless the authorization is terminated or revoked sooner.  Performed at [facility], [address], [place], [place] [address]   SARS Coronavirus 2 by RT PCR (hospital order, performed in [facility] hospital lab) Nasopharyngeal Nasopharyngeal Swab     Status: None   Collection Time: 08/10/20  2:33 PM   Specimen: Nasopharyngeal Swab  Result Value Ref Range Status   SARS Coronavirus 2 NEGATIVE NEGATIVE Final    Comment: (NOTE) SARS-CoV-2 target nucleic acids are NOT DETECTED.  The SARS-CoV-2 RNA is generally detectable in upper and lower respiratory specimens during the acute phase of infection. The lowest concentration of SARS-CoV-2 viral copies this assay can detect is 250 copies / mL. A negative result does not preclude SARS-CoV-2 infection and should not be used as the sole basis for treatment or  other patient management decisions.  A negative result may occur with improper specimen collection / handling, submission of specimen other than nasopharyngeal swab, presence of viral mutation(s) within the areas targeted by this assay, and inadequate number of viral copies (<250 copies / mL). A negative result must be combined with clinical observations, patient history, and epidemiological information.  Fact Sheet for Patients:   StrictlyIdeas.no  Fact Sheet for Healthcare Providers: BankingDealers.co.za  This test is not yet approved or  cleared by the [place] FDA and has been authorized for detection and/or diagnosis of SARS-CoV-2 by FDA under an Emergency Use Authorization (EUA).  This EUA will remain in effect (meaning this test can be used) for the duration of the COVID-19 declaration under Section 564(b)(1) of the Act, 21 U.S.C. section 360bbb-3(b)(1), unless the authorization is terminated or revoked sooner.  Performed at [facility], [place] [address], [place], [place] [address]   MRSA PCR Screening     Status: None   Collection Time: 08/11/20  5:28 AM   Specimen: Nasal Mucosa; Nasopharyngeal  Result Value Ref Range Status   MRSA by PCR NEGATIVE NEGATIVE Final    Comment:        The GeneXpert MRSA Assay (FDA approved for NASAL specimens only), is one component of a comprehensive MRSA colonization surveillance program. It is not intended to diagnose MRSA infection nor to guide or monitor treatment for MRSA infections. Performed at [facility], [place] [address], [place], [place] [address]          Radiology Studies: DG FEMUR 1V RIGHT  Result Date: 08/11/2020 CLINICAL DATA:  Fixation of right femoral fracture. EXAM: RIGHT FEMUR 1 VIEW COMPARISON:  08/10/2020 FINDINGS: Examination demonstrates evidence of patient's right femoral intramedullary nail ridging known distal femoral diametaphyseal  fracture. There are proximal and distal anchoring screws. Hardware is intact with anatomic alignment over the fracture site. Exam is otherwise unchanged. IMPRESSION: Fixation of right femoral diametaphyseal fracture with hardware intact and anatomic alignment over the fracture site. Electronically Signed   By: Marin Olp M.D.   On: 08/11/2020 13:48        Scheduled Meds: . aspirin EC  81 mg Oral BID  . Chlorhexidine Gluconate Cloth  6 each Topical Daily  . docusate sodium  100 mg Oral BID  . insulin aspart  0-15 Units Subcutaneous TID WC  . insulin aspart  0-5 Units Subcutaneous QHS   Continuous Infusions: .  cefTRIAXone (ROCEPHIN)  IV 1 g (08/12/20 1426)     LOS: 3 days    Time spent: 36 minutes spent on chart review, discussion with nursing staff, consultants, updating family and interview/physical exam; more than 50% of that time was spent in counseling and/or coordination of care.    Markell Schrier J [place] DO [facility] Available via Epic secure chat 7am-7pm After these hours, please refer to coverage provider listed on [email or web address] 08/13/2020, 12:15 PM

## 2020-08-13 NOTE — Progress Notes (Signed)
Patient ID: Susan Odom, female   DOB: [birth year], 74 y.o.   MRN: [id number] Brain lab protocol mri ordered.

## 2020-08-13 NOTE — Plan of Care (Signed)
  Problem: Coping: Goal: Level of anxiety will decrease Outcome: Progressing   Problem: Pain Managment: Goal: General experience of comfort will improve Outcome: Progressing   Problem: Safety: Goal: Ability to remain free from injury will improve Outcome: Progressing   

## 2020-08-13 NOTE — TOC CAGE-AID Note (Signed)
Transition of Care [facility]) - CAGE-AID Screening   Patient Details  Name: Susan Odom MRN: [id number] Date of Birth: 08/15/1946  Transition of [facility]) CM/SW Contact:    Emeterio Reeve, [place] Phone Number: 08/13/2020, 3:52 PM   Clinical Narrative:  Pt unable to particpate in asses, pt os oriented x2.   CAGE-AID Screening: Substance Abuse Screening unable to be completed due to: : Patient unable to participate            [place] Clinical Social Worker [phone]

## 2020-08-13 NOTE — Plan of Care (Signed)

## 2020-08-14 ENCOUNTER — Other Ambulatory Visit: Payer: Self-pay

## 2020-08-14 ENCOUNTER — Encounter (HOSPITAL_COMMUNITY)
Admission: EM | Disposition: A | Discharge status: Skilled Nursing Facility | Payer: Self-pay | Source: Ambulatory Visit | Attending: Internal Medicine

## 2020-08-14 ENCOUNTER — Inpatient Hospital Stay (HOSPITAL_COMMUNITY): Payer: Medicare Other

## 2020-08-14 HISTORY — PX: FRAMELESS  BIOPSY WITH BRAINLAB: SHX6879

## 2020-08-14 HISTORY — PX: APPLICATION OF CRANIAL NAVIGATION: SHX6578

## 2020-08-14 LAB — BASIC METABOLIC PANEL
Anion gap: 9 (ref 5–15)
BUN: 9 mg/dL (ref 8–23)
CO2: 26 mmol/L (ref 22–32)
Calcium: 9.2 mg/dL (ref 8.9–10.3)
Chloride: 99 mmol/L (ref 98–111)
Creatinine, Ser: 0.55 mg/dL (ref 0.44–1.00)
GFR calc Af Amer: >60 mL/min (ref >60)
GFR calc non Af Amer: >60 mL/min (ref >60)
Glucose, Bld: 186 mg/dL — ABNORMAL HIGH (ref 70–99)
Potassium: 3.5 mmol/L (ref 3.5–5.1)
Sodium: 134 mmol/L — ABNORMAL LOW (ref 135–145)

## 2020-08-14 LAB — ABO/RH: ABO/RH(D): O POS

## 2020-08-14 LAB — TYPE AND SCREEN
ABO/RH(D): O POS
Antibody Screen: NEGATIVE

## 2020-08-14 LAB — CBC
HCT: 32.2 % — ABNORMAL LOW (ref 36.0–46.0)
Hemoglobin: 10.5 g/dL — ABNORMAL LOW (ref 12.0–15.0)
MCH: 30.3 pg (ref 26.0–34.0)
MCHC: 32.6 g/dL (ref 30.0–36.0)
MCV: 92.8 fL (ref 80.0–100.0)
Platelets: 166 10*3/uL (ref 150–400)
RBC: 3.47 MIL/uL — ABNORMAL LOW (ref 3.87–5.11)
RDW: 14.2 % (ref 11.5–15.5)
WBC: 6 10*3/uL (ref 4.0–10.5)
nRBC: 0.5 % — ABNORMAL HIGH (ref 0.0–0.2)

## 2020-08-14 LAB — GLUCOSE, CAPILLARY
Glucose-Capillary: 181 mg/dL — ABNORMAL HIGH (ref 70–99)
Glucose-Capillary: 158 mg/dL — ABNORMAL HIGH (ref 70–99)
Glucose-Capillary: 151 mg/dL — ABNORMAL HIGH (ref 70–99)
Glucose-Capillary: 174 mg/dL — ABNORMAL HIGH (ref 70–99)

## 2020-08-14 LAB — MAGNESIUM: Magnesium: 1.7 mg/dL (ref 1.7–2.4)

## 2020-08-14 LAB — PROTIME-INR
INR: 1.1 (ref 0.8–1.2)
Prothrombin Time: 13.9 seconds (ref 11.4–15.2)

## 2020-08-14 SURGERY — FRAMELESS BIOPSY WITH BRAINLAB
Anesthesia: General | Site: Head

## 2020-08-14 MED ORDER — ONDANSETRON HCL 4 MG/2ML IJ SOLN
INTRAMUSCULAR | Status: AC
  Filled 2020-08-14: qty 2

## 2020-08-14 MED ORDER — LEVETIRACETAM IN NACL 500 MG/100ML IV SOLN
500.0000 mg | Freq: Two times a day (BID) | INTRAVENOUS | Status: DC
  Administered 2020-08-14 – 2020-08-18 (×8): 500 mg via INTRAVENOUS
  Filled 2020-08-14 (×8): qty 100

## 2020-08-14 MED ORDER — ACETAMINOPHEN 10 MG/ML IV SOLN
INTRAVENOUS | Status: AC
  Filled 2020-08-14: qty 100

## 2020-08-14 MED ORDER — SUGAMMADEX SODIUM 200 MG/2ML IV SOLN
INTRAVENOUS | Status: DC | PRN
  Administered 2020-08-14: 210 mg via INTRAVENOUS

## 2020-08-14 MED ORDER — BACITRACIN ZINC 500 UNIT/GM EX OINT
TOPICAL_OINTMENT | CUTANEOUS | Status: AC
  Filled 2020-08-14: qty 28.35

## 2020-08-14 MED ORDER — ROCURONIUM BROMIDE 10 MG/ML (PF) SYRINGE
PREFILLED_SYRINGE | INTRAVENOUS | Status: AC
  Filled 2020-08-14: qty 10

## 2020-08-14 MED ORDER — BUPIVACAINE HCL (PF) 0.25 % IJ SOLN
INTRAMUSCULAR | Status: DC | PRN
  Administered 2020-08-14: 2.5 mL

## 2020-08-14 MED ORDER — FENTANYL CITRATE (PF) 100 MCG/2ML IJ SOLN: 25.0000 ug | INTRAMUSCULAR | Status: DC | PRN

## 2020-08-14 MED ORDER — SODIUM CHLORIDE 0.9 % IV SOLN: INTRAVENOUS | Status: AC

## 2020-08-14 MED ORDER — ROCURONIUM BROMIDE 100 MG/10ML IV SOLN: INTRAVENOUS | Status: DC | PRN

## 2020-08-14 MED ORDER — THROMBIN 5000 UNITS EX SOLR
CUTANEOUS | Status: AC
  Filled 2020-08-14: qty 5000

## 2020-08-14 MED ORDER — LIDOCAINE-EPINEPHRINE 1 %-1:100000 IJ SOLN
INTRAMUSCULAR | Status: AC
  Filled 2020-08-14: qty 1

## 2020-08-14 MED ORDER — CEFAZOLIN SODIUM-DEXTROSE 2-4 GM/100ML-% IV SOLN
2.0000 g | Freq: Once | INTRAVENOUS | Status: AC
  Administered 2020-08-14: 2 g via INTRAVENOUS

## 2020-08-14 MED ORDER — FENTANYL CITRATE (PF) 100 MCG/2ML IJ SOLN
INTRAMUSCULAR | Status: DC | PRN
Start: 2020-08-14 — End: 2020-08-14
  Administered 2020-08-14: 50 ug via INTRAVENOUS
  Administered 2020-08-14: 75 ug via INTRAVENOUS

## 2020-08-14 MED ORDER — LIDOCAINE-EPINEPHRINE 1 %-1:100000 IJ SOLN
INTRAMUSCULAR | Status: DC | PRN
  Administered 2020-08-14: 2.5 mL

## 2020-08-14 MED ORDER — 0.9 % SODIUM CHLORIDE (POUR BTL) OPTIME
TOPICAL | Status: DC | PRN
  Administered 2020-08-14: 1000 mL

## 2020-08-14 MED ORDER — MUPIROCIN 2 % EX OINT
TOPICAL_OINTMENT | CUTANEOUS | Status: AC
  Filled 2020-08-14: qty 22

## 2020-08-14 MED ORDER — LIDOCAINE 2% (20 MG/ML) 5 ML SYRINGE
INTRAMUSCULAR | Status: DC | PRN
  Administered 2020-08-14: 100 mg via INTRAVENOUS

## 2020-08-14 MED ORDER — ONDANSETRON HCL 4 MG/2ML IJ SOLN
INTRAMUSCULAR | Status: DC | PRN
  Administered 2020-08-14: 4 mg via INTRAVENOUS

## 2020-08-14 MED ORDER — LIDOCAINE 2% (20 MG/ML) 5 ML SYRINGE
INTRAMUSCULAR | Status: AC
  Filled 2020-08-14: qty 5

## 2020-08-14 MED ORDER — PROPOFOL 10 MG/ML IV BOLUS
INTRAVENOUS | Status: DC | PRN
  Administered 2020-08-14: 140 mg via INTRAVENOUS

## 2020-08-14 MED ORDER — PHENYLEPHRINE HCL-NACL 10-0.9 MG/250ML-% IV SOLN
INTRAVENOUS | Status: DC | PRN
  Administered 2020-08-14: 30 ug/min via INTRAVENOUS

## 2020-08-14 MED ORDER — ESMOLOL HCL 100 MG/10ML IV SOLN
INTRAVENOUS | Status: AC
  Filled 2020-08-14: qty 10

## 2020-08-14 MED ORDER — OXYCODONE HCL 5 MG/5ML PO SOLN: 5.0000 mg | Freq: Once | ORAL | Status: DC | PRN

## 2020-08-14 MED ORDER — PROPOFOL 10 MG/ML IV BOLUS
INTRAVENOUS | Status: AC
  Filled 2020-08-14: qty 20

## 2020-08-14 MED ORDER — SODIUM CHLORIDE 0.9 % IV SOLN: INTRAVENOUS | Status: DC | PRN

## 2020-08-14 MED ORDER — BUPIVACAINE HCL (PF) 0.5 % IJ SOLN
INTRAMUSCULAR | Status: AC
  Filled 2020-08-14: qty 30

## 2020-08-14 MED ORDER — ROCURONIUM BROMIDE 100 MG/10ML IV SOLN
INTRAVENOUS | Status: DC | PRN
  Administered 2020-08-14: 50 mg via INTRAVENOUS
  Administered 2020-08-14: 20 mg via INTRAVENOUS

## 2020-08-14 MED ORDER — BACITRACIN ZINC 500 UNIT/GM EX OINT
TOPICAL_OINTMENT | CUTANEOUS | Status: DC | PRN
  Administered 2020-08-14: 1 via TOPICAL

## 2020-08-14 MED ORDER — CEFAZOLIN SODIUM-DEXTROSE 2-4 GM/100ML-% IV SOLN
INTRAVENOUS | Status: AC
  Filled 2020-08-14: qty 100

## 2020-08-14 MED ORDER — FENTANYL CITRATE (PF) 250 MCG/5ML IJ SOLN
INTRAMUSCULAR | Status: AC
Start: 2020-08-14 — End: ?
  Filled 2020-08-14: qty 5

## 2020-08-14 MED ORDER — THROMBIN 5000 UNITS EX SOLR: OROMUCOSAL | Status: DC | PRN

## 2020-08-14 MED ORDER — DEXAMETHASONE SODIUM PHOSPHATE 4 MG/ML IJ SOLN
4.0000 mg | Freq: Four times a day (QID) | INTRAMUSCULAR | Status: AC
  Administered 2020-08-15 – 2020-08-16 (×4): 4 mg via INTRAVENOUS
  Filled 2020-08-14 (×4): qty 1

## 2020-08-14 MED ORDER — DEXAMETHASONE SODIUM PHOSPHATE 4 MG/ML IJ SOLN
4.0000 mg | Freq: Three times a day (TID) | INTRAMUSCULAR | Status: DC
  Administered 2020-08-16 – 2020-08-21 (×14): 4 mg via INTRAVENOUS
  Filled 2020-08-14 (×14): qty 1

## 2020-08-14 MED ORDER — ONDANSETRON HCL 4 MG/2ML IJ SOLN: 4.0000 mg | Freq: Four times a day (QID) | INTRAMUSCULAR | Status: DC | PRN

## 2020-08-14 MED ORDER — DEXAMETHASONE SODIUM PHOSPHATE 10 MG/ML IJ SOLN
6.0000 mg | Freq: Four times a day (QID) | INTRAMUSCULAR | Status: AC
  Administered 2020-08-14 – 2020-08-15 (×4): 6 mg via INTRAVENOUS
  Filled 2020-08-14 (×4): qty 1

## 2020-08-14 MED ORDER — CHLORHEXIDINE GLUCONATE 0.12 % MT SOLN
OROMUCOSAL | Status: AC
  Administered 2020-08-14: 15 mL
  Filled 2020-08-14: qty 15

## 2020-08-14 MED ORDER — ALBUMIN HUMAN 5 % IV SOLN: INTRAVENOUS | Status: DC | PRN

## 2020-08-14 MED ORDER — LOSARTAN POTASSIUM 50 MG PO TABS
100.0000 mg | ORAL_TABLET | Freq: Every day | ORAL | Status: DC
  Administered 2020-08-15 – 2020-08-21 (×7): 100 mg via ORAL
  Filled 2020-08-14 (×7): qty 2

## 2020-08-14 MED ORDER — GLYCOPYRROLATE PF 0.2 MG/ML IJ SOSY
PREFILLED_SYRINGE | INTRAMUSCULAR | Status: AC
  Filled 2020-08-14: qty 1

## 2020-08-14 MED ORDER — SODIUM CHLORIDE 0.9 % IV SOLN: INTRAVENOUS | Status: DC

## 2020-08-14 MED ORDER — FENTANYL CITRATE (PF) 250 MCG/5ML IJ SOLN
INTRAMUSCULAR | Status: AC
  Filled 2020-08-14: qty 5

## 2020-08-14 MED ORDER — OXYCODONE HCL 5 MG PO TABS: 5.0000 mg | ORAL_TABLET | Freq: Once | ORAL | Status: DC | PRN

## 2020-08-14 SURGICAL SUPPLY — 68 items
BIT DRILL SHORT 3X80 (BIT) ×1 IMPLANT
BIT DRILL SHORT 3X80MM (BIT) ×1
BLADE CLIPPER SURG (BLADE) IMPLANT
BLADE SURG 10 STRL SS (BLADE) ×4 IMPLANT
BLADE SURG 15 STRL LF DISP TIS (BLADE) ×2 IMPLANT
BLADE SURG 15 STRL SS (BLADE) ×4
BNDG ADH 1X3 SHEER STRL LF (GAUZE/BANDAGES/DRESSINGS) IMPLANT
BNDG ADH THN 3X1 STRL LF (GAUZE/BANDAGES/DRESSINGS)
CABLE BIPOLOR RESECTION CORD (MISCELLANEOUS) ×4 IMPLANT
CANISTER SUCT 3000ML PPV (MISCELLANEOUS) ×4 IMPLANT
CARTRIDGE OIL MAESTRO DRILL (MISCELLANEOUS) ×2 IMPLANT
CLIP RANEY DISP (INSTRUMENTS) ×4 IMPLANT
CONT SPEC 4OZ CLIKSEAL STRL BL (MISCELLANEOUS) ×4 IMPLANT
COVER MAYO STAND STRL (DRAPES) ×4 IMPLANT
COVER WAND RF STERILE (DRAPES) ×2 IMPLANT
DIFFUSER DRILL AIR PNEUMATIC (MISCELLANEOUS) ×4 IMPLANT
DRAPE INCISE IOBAN 66X45 STRL (DRAPES) ×4 IMPLANT
DRAPE NEUROLOGICAL W/INCISE (DRAPES) ×2 IMPLANT
DRSG TELFA 3X8 NADH (GAUZE/BANDAGES/DRESSINGS) ×4 IMPLANT
DURAPREP 26ML APPLICATOR (WOUND CARE) ×2 IMPLANT
ELECT CAUTERY BLADE 6.4 (BLADE) ×4 IMPLANT
ELECT REM PT RETURN 9FT ADLT (ELECTROSURGICAL) ×4
ELECTRODE REM PT RTRN 9FT ADLT (ELECTROSURGICAL) ×2 IMPLANT
GAUZE 4X4 16PLY RFD (DISPOSABLE) ×4 IMPLANT
GLOVE BIO SURGEON STRL SZ 6.5 (GLOVE) ×5 IMPLANT
GLOVE BIO SURGEONS STRL SZ 6.5 (GLOVE) ×1
GLOVE BIOGEL PI IND STRL 6.5 (GLOVE) ×4 IMPLANT
GLOVE BIOGEL PI IND STRL 8.5 (GLOVE) ×2 IMPLANT
GLOVE BIOGEL PI INDICATOR 6.5 (GLOVE) ×2
GLOVE BIOGEL PI INDICATOR 8.5 (GLOVE) ×2
GLOVE ECLIPSE 8.5 STRL (GLOVE) ×4 IMPLANT
GLOVE EXAM NITRILE XL STR (GLOVE) IMPLANT
GOWN STRL REUS W/ TWL LRG LVL3 (GOWN DISPOSABLE) IMPLANT
GOWN STRL REUS W/ TWL XL LVL3 (GOWN DISPOSABLE) ×2 IMPLANT
GOWN STRL REUS W/TWL 2XL LVL3 (GOWN DISPOSABLE) ×6 IMPLANT
GOWN STRL REUS W/TWL LRG LVL3 (GOWN DISPOSABLE)
GOWN STRL REUS W/TWL XL LVL3 (GOWN DISPOSABLE)
KIT BASIN OR (CUSTOM PROCEDURE TRAY) ×4 IMPLANT
KIT NDL BIOPSY 1.8X235 CRAN (NEEDLE) IMPLANT
KIT NEEDLE BIOPSY 1.8X235 CRAN (NEEDLE) ×4 IMPLANT
KIT TURNOVER KIT B (KITS) ×4 IMPLANT
KIT VARIOGUIDE DRILL 1.9 (KITS) ×2 IMPLANT
MARKER SPHERE PSV REFLC 13MM (MARKER) ×10 IMPLANT
NDL HYPO 25X1 1.5 SAFETY (NEEDLE) ×2 IMPLANT
NEEDLE HYPO 25X1 1.5 SAFETY (NEEDLE) ×4 IMPLANT
NS IRRIG 1000ML POUR BTL (IV SOLUTION) ×4 IMPLANT
OIL CARTRIDGE MAESTRO DRILL (MISCELLANEOUS)
PACK EENT II TURBAN DRAPE (CUSTOM PROCEDURE TRAY) ×4 IMPLANT
PAD ARMBOARD 7.5X6 YLW CONV (MISCELLANEOUS) ×12 IMPLANT
PAD DRESSING TELFA 3X8 NADH (GAUZE/BANDAGES/DRESSINGS) IMPLANT
PATTIES SURGICAL .5 X.5 (GAUZE/BANDAGES/DRESSINGS) ×4 IMPLANT
PENCIL BUTTON HOLSTER BLD 10FT (ELECTRODE) ×4 IMPLANT
PERFORATOR CRAN ADLT SML 11X7 (MISCELLANEOUS) ×4 IMPLANT
STAPLER SKIN PROX WIDE 3.9 (STAPLE) ×4 IMPLANT
SUT BONE WAX W31G (SUTURE) ×4 IMPLANT
SUT VIC AB 2-0 CP2 18 (SUTURE) ×4 IMPLANT
SUT VIC AB 4-0 RB1 18 (SUTURE) ×4 IMPLANT
SUT VICRYL 3 0 RAPIDE (SUTURE) ×2 IMPLANT
SYR 3ML LL SCALE MARK (SYRINGE) ×6 IMPLANT
SYR 5ML LUER SLIP (SYRINGE) ×8 IMPLANT
SYR BULB EAR ULCER 3OZ GRN STR (SYRINGE) ×4 IMPLANT
SYR BULB IRRIG 60ML STRL (SYRINGE) ×2 IMPLANT
SYR CONTROL 10ML LL (SYRINGE) ×4 IMPLANT
TOWEL GREEN STERILE (TOWEL DISPOSABLE) ×4 IMPLANT
TOWEL GREEN STERILE FF (TOWEL DISPOSABLE) ×4 IMPLANT
TUBE CONNECTING 12'X1/4 (SUCTIONS) ×1
TUBE CONNECTING 12X1/4 (SUCTIONS) ×3 IMPLANT
WATER STERILE IRR 1000ML POUR (IV SOLUTION) ×4 IMPLANT

## 2020-08-14 NOTE — Plan of Care (Signed)

## 2020-08-14 NOTE — Anesthesia Procedure Notes (Signed)
Procedure Name: Intubation Date/Time: 08/14/2020 3:26 PM Performed by: Cleda Daub, CRNA Pre-anesthesia Checklist: Patient identified, Emergency Drugs available, Suction available and Patient being monitored Patient Re-evaluated:Patient Re-evaluated prior to induction Oxygen Delivery Method: Circle system utilized Preoxygenation: Pre-oxygenation with 100% oxygen Induction Type: IV induction Ventilation: Mask ventilation without difficulty Laryngoscope Size: Glidescope and 3 Grade View: Grade I Tube type: Oral Tube size: 7.0 mm Number of attempts: 1 Airway Equipment and Method: Stylet,  Oral airway and Video-laryngoscopy Placement Confirmation: ETT inserted through vocal cords under direct vision,  positive ETCO2 and breath sounds checked- equal and bilateral Secured at: 22 cm Tube secured with: Tape Dental Injury: Teeth and Oropharynx as per pre-operative assessment

## 2020-08-14 NOTE — Plan of Care (Signed)
  Problem: Pain Managment: Goal: General experience of comfort will improve Outcome: Progressing   Problem: Safety: Goal: Ability to remain free from injury will improve Outcome: Progressing   Problem: Skin Integrity: Goal: Risk for impaired skin integrity will decrease Outcome: Progressing   

## 2020-08-14 NOTE — Progress Notes (Signed)
PT Cancellation Note  Patient Details Name: Susan Odom MRN: [id number] DOB: 12/30/1945   Cancelled Treatment:    Reason Eval/Treat Not Completed: (P) Patient at procedure or test/unavailable (Pt off unit for brain biopsy, will f/u pending results if medically appropriate.)   Trevel Dillenbeck Eli Hose 08/14/2020, 1:31 PM  Erasmo Leventhal , PTA Acute Rehabilitation Services Pager [phone] Office [phone]

## 2020-08-14 NOTE — Transfer of Care (Signed)
Immediate Anesthesia Transfer of Care Note  Patient: Susan Odom  Procedure(s) Performed: Left frontal stereotactic brain biopsy (Left Head) APPLICATION OF CRANIAL NAVIGATION (N/A )  Patient Location: PACU  Anesthesia Type:General  Level of Consciousness: awake and patient cooperative  Airway & Oxygen Therapy: Patient Spontanous Breathing and Patient connected to face mask oxygen  Post-op Assessment: Report given to RN and Post -op Vital signs reviewed and stable  Post vital signs: Reviewed and stable  Last Vitals:  Vitals Value Taken Time  BP 126/57 08/14/20 1700  Temp    Pulse 83 08/14/20 1701  Resp 27 08/14/20 1701  SpO2 96 % 08/14/20 1701  Vitals shown include unvalidated device data.  Last Pain:  Vitals:   08/14/20 0819  TempSrc:   PainSc: Asleep         Complications: No complications documented.

## 2020-08-14 NOTE — Progress Notes (Signed)
Patient ID: Susan Odom, female   DOB: 02-01-1946, 74 y.o.   MRN: [id number] MRI reviewed left posterior frontal lesion looks most accessible.  We will plan biopsy for that region.  Consent signed

## 2020-08-14 NOTE — Anesthesia Preprocedure Evaluation (Signed)
Anesthesia Evaluation  Patient identified by MRN, date of birth, ID band Patient awake    Reviewed: Allergy & Precautions, H&P , NPO status , Patient's Chart, lab work & pertinent test results  Airway Mallampati: II   Neck ROM: full    Dental   Pulmonary    breath sounds clear to auscultation       Cardiovascular hypertension,  Rhythm:regular Rate:Normal     Neuro/Psych Brain mass  Neuromuscular disease    GI/Hepatic   Endo/Other  diabetes, Type 2  Renal/GU      Musculoskeletal   Abdominal   Peds  Hematology  (+) anemia ,   Anesthesia Other Findings   Reproductive/Obstetrics                             Anesthesia Physical Anesthesia Plan  ASA: III  Anesthesia Plan: General   Post-op Pain Management:    Induction: Intravenous  PONV Risk Score and Plan: 3 and Ondansetron, Dexamethasone and Treatment may vary due to age or medical condition  Airway Management Planned: Oral ETT  Additional Equipment: Arterial line  Intra-op Plan:   Post-operative Plan: Extubation in OR  Informed Consent: I have reviewed the patients History and Physical, chart, labs and discussed the procedure including the risks, benefits and alternatives for the proposed anesthesia with the patient or authorized representative who has indicated his/her understanding and acceptance.       Plan Discussed with: CRNA, Anesthesiologist and Surgeon  Anesthesia Plan Comments:         Anesthesia Quick Evaluation

## 2020-08-14 NOTE — Progress Notes (Signed)
PROGRESS NOTE    Susan Odom  UDJ:[id number] DOB: 1946-03-05 DOA: 08/10/2020 PCP: Redmond School, MD    Brief Narrative:  Susan Odom is a 74 year old female with past medical history notable for essential hypertension, diabetes, obesity who presented to the emergency department with right knee pain.  Pain present x2 weeks, but has progressed over the past 24 hours.  Associated with swelling.  Patient reports a fall few days prior.  Additionally, daughter reports gradual onset of confusion and cognitive decline recently.  Patient initially went to [facility], ED on 08/07/2020 with back pain, MRI notable for degenerative changes with UA concerning for UTI and discharged home on Keflex, Percocet and prednisone.  Was seen by neurosurgery with worsening back pain and difficulty walking for 2 weeks.  She was unable to stand or bear weight in the office; and subsequently was sent to the emergency department for further evaluation and management.  In the ED, the [facility] count 14.0, sodium 134, ESR within normal limits, CRP 5.4, Covid-19 PCR negative.  X-ray right hip notable for severe displaced and comminuted oblique fracture distal right femur.  Chest x-ray with no acute findings.  CT right knee with mildly comminuted and slightly displaced fracture distal femoral metadiaphysis.  CT head with lobular masslike lesion predominantly along the corpus callosum with likely surrounding vasogenic edema concerning for intracranial neoplasm such as lymphoma or glioblastoma multiform a.  Neurosurgery and orthopedics were consulted.  TRH consulted for admission.  Assessment & Plan:   Principal Problem:   Femur fracture, right ([place]) Active Problems:   Fall   Hypertension   Leukocytosis   Brain mass   Palliative care by specialist   DNR (do not resuscitate)   Goals of care, counseling/discussion   Femur fracture ([place])   Right femur fracture Patient presenting with right lower extremity pain, difficulty  ambulating.  Was found to have comminuted, displaced fracture distal femoral metadiaphysis on imaging. Patient underwent ORIF with intramedullary retrograde femoral nailing with cables on 08/11/2020 by Dr. Marlou Sa. --TDWB RLE for transfers only x2 weeks then weightbearing as tolerated per orthopedics --Oxycodone 5 mg every 4 hours as needed moderate pain --DVT prophylaxis with aspirin 81 mg p.o. 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  CT head without contrast with lobular masslike lesion corpus callosum, intraventricular septum and fornix with likely surrounding vasogenic edema which crosses the genu of the corpus callosum and also present in the left frontal white matter.  MR brain with and without contrast with central callosal, septal, paraventricular infiltrative enhancing tumor most suspicious for glioblastoma multiforming/high-grade glioma vs lymphoma. --Neurosurgery, neuro oncology and palliative care following, appreciate assistance --Neurosurgery plans stereotactic brain biopsy today  Urinary tract infection Urinalysis with large leukoesterase, negative nitrite, many bacteria, greater than 50 WBCs.  Patient with continued dysuria and foul-smelling urine. Urine culture: With multiple species.  Completed 3-day course of ceftriaxone on 08/13/2020.  Hypokalemia Follow electrolytes daily  Essential hypertension On amlodipine and losartan at home.  BP 142/60 this a.m. --Continue amlodipine 10 mg p.o. daily today --Continue to hold home losartan; restart after brain biopsy --Hydralazine IV as needed  Type 2 diabetes mellitus On Invokana outpatient, will hold while inpatient. --Insulin sliding scale for coverage --CBGs QAC/HS  Leukocytosis Patient afebrile with a WBC count of 14.0 on admission.  Etiology likely secondary to p.o. steroids started outpatient versus  UTI. --WBC 14.0>>6.0 --Follow CBC daily   DVT  prophylaxis: SCDs, aspirin 81 mg p.o. twice daily per orthopedics Code Status: DNR Family Communication: Updated patient extensively at bedside  Disposition Plan:  Status is: Inpatient  Remains inpatient appropriate because:Ongoing diagnostic testing needed not appropriate for outpatient work up, Unsafe d/c plan, IV treatments appropriate due to intensity of illness or inability to take PO and Inpatient level of care appropriate due to severity of illness pending brain biopsy scheduled for tomorrow   Dispo: The patient is from: Home              Anticipated d/c is to: To be determined              Anticipated d/c date is: > 3 days              Patient currently is not medically stable to d/c.    Consultants:   Orthopedics, Dr. Marlou Sa  Neurosurgery, Dr. Ellene Route  Palliative care  Neuro oncology, Dr. Mickeal Skinner - discussed on 9/18  Procedures:   ORIF with cables and retrograde IMN, Dr. Marlou Sa 9/18  Brain biopsy pending on 08/13/2020  Antimicrobials:   Ceftriaxone 9/17 - 9/20   Subjective: Patient seen and examined at bedside, resting comfortably. Pain controlled.  No specific complaints or concerns this morning.  Awaiting brain biopsy later today.  Denies headache, no visual changes, no chest pain, palpitations, no shortness of breath, no abdominal pain, no fever/chills/night sweats, no nausea/vomiting/diarrhea. No acute events overnight per nursing staff.  Objective: Vitals:   08/13/20 1920 08/14/20 0254 08/14/20 0600 08/14/20 0804  BP: 130/77 (!) 142/60  (!) 150/65  Pulse: 97 78  80  Resp: $Remo'18 17  15  'AEHLo$ Temp: 97.7 F (36.5 C) 97.7 F (36.5 C)  97.6 F (36.4 C)  TempSrc: Oral Oral  Oral  SpO2: 90% 94%  90%  Weight:   104.2 kg   Height:   '5\' 11"'$  (1.803 m)     Intake/Output Summary (Last 24 hours) at 08/14/2020 1232 Last data filed at 08/14/2020 0900 Gross per 24 hour  Intake 240 ml  Output 1700 ml  Net -1460 ml   Filed  Weights   08/13/20 0500 08/13/20 1040 08/14/20 0600  Weight: 99.8 kg 99.8 kg 104.2 kg    Examination:  General exam: Appears calm and comfortable Respiratory system: Clear to auscultation. Respiratory effort normal. Oxygenating well on room air Cardiovascular system: S1 & S2 heard, RRR. No JVD, murmurs, rubs, gallops or clicks. No pedal edema. Gastrointestinal system: Abdomen is nondistended, soft and nontender. No organomegaly or masses felt. Normal bowel sounds heard. Central nervous system: Alert and oriented. No focal neurological deficits. Extremities: No peripheral edema, surgical dressing noted in place, clean/dry/intact Skin: No rashes, lesions or ulcers Psychiatry: Judgement and insight appear poor. Mood & affect appropriate.     Data Reviewed: I have personally reviewed following labs and imaging studies  CBC: Recent Labs  Lab 08/10/20 1405 08/12/20 0648 08/13/20 0150 08/14/20 0446  WBC 14.0* 8.8 7.4 6.0  NEUTROABS 11.7*  --   --   --   HGB 13.7 10.8* 10.2* 10.5*  HCT 42.6 32.9* 31.6* 32.2*  MCV 92.6 94.3 92.4 92.8  PLT 218 161 152 882   Basic Metabolic Panel: Recent Labs  Lab 08/10/20 1405 08/10/20 1616 08/12/20 0648 08/13/20 0150 08/14/20 0446  NA 134*  --  138 138 134*  K 4.4  --  4.1 3.4* 3.5  CL 97*  --  106 103 99  CO2 24  --  20* 25  26  GLUCOSE 270*  --  182* 188* 186*  BUN 26*  --  $R'14 10 9  'Mg$ CREATININE 0.78  --  0.64 0.48 0.55  CALCIUM 10.0  --  9.3 9.3 9.2  MG  --  2.0  --   --  1.7   GFR: Estimated Creatinine Clearance: 82 mL/min (by C-G formula based on SCr of 0.55 mg/dL). Liver Function Tests: Recent Labs  Lab 08/10/20 1405  AST 16  ALT 16  ALKPHOS 75  BILITOT 1.4*  PROT 6.8  ALBUMIN 3.3*   No results for input(s): LIPASE, AMYLASE in the last 168 hours. No results for input(s): AMMONIA in the last 168 hours. Coagulation Profile: Recent Labs  Lab 08/12/20 0648 08/13/20 0150 08/14/20 0446  INR 1.2 1.2 1.1   Cardiac  Enzymes: No results for input(s): CKTOTAL, CKMB, CKMBINDEX, TROPONINI in the last 168 hours. BNP (last 3 results) No results for input(s): PROBNP in the last 8760 hours. HbA1C: No results for input(s): HGBA1C in the last 72 hours. CBG: Recent Labs  Lab 08/13/20 1105 08/13/20 1600 08/13/20 2007 08/14/20 0630 08/14/20 1138  GLUCAP 199* 164* 167* 181* 158*   Lipid Profile: No results for input(s): CHOL, HDL, LDLCALC, TRIG, CHOLHDL, LDLDIRECT in the last 72 hours. Thyroid Function Tests: No results for input(s): TSH, T4TOTAL, FREET4, T3FREE, THYROIDAB in the last 72 hours. Anemia Panel: No results for input(s): VITAMINB12, FOLATE, FERRITIN, TIBC, IRON, RETICCTPCT in the last 72 hours. Sepsis Labs: No results for input(s): PROCALCITON, LATICACIDVEN in the last 168 hours.  Recent Results (from the past 240 hour(s))  Urine Culture     Status: Abnormal   Collection Time: 08/07/20  7:22 AM   Specimen: Urine, Random  Result Value Ref Range Status   Specimen Description   Final    URINE, RANDOM Performed at [facility], [address], [place], [place] [address]    Special Requests   Final    NONE Performed at [facility], [address], [place], [place] [address]    Culture MULTIPLE SPECIES PRESENT, SUGGEST RECOLLECTION (A)  Final   Report Status 08/08/2020 FINAL  Final  SARS Coronavirus 2 by RT PCR (hospital order, performed in [facility] hospital lab) Nasopharyngeal Nasopharyngeal Swab     Status: None   Collection Time: 08/07/20 10:03 AM   Specimen: Nasopharyngeal Swab  Result Value Ref Range Status   SARS Coronavirus 2 NEGATIVE NEGATIVE Final    Comment: (NOTE) SARS-CoV-2 target nucleic acids are NOT DETECTED.  The SARS-CoV-2 RNA is generally detectable in upper and lower respiratory specimens during the acute phase of infection. The lowest concentration of SARS-CoV-2 viral copies this assay can detect is 250 copies / mL. A negative result does not preclude SARS-CoV-2  infection and should not be used as the sole basis for treatment or other patient management decisions.  A negative result may occur with improper specimen collection / handling, submission of specimen other than nasopharyngeal swab, presence of viral mutation(s) within the areas targeted by this assay, and inadequate number of viral copies (<250 copies / mL). A negative result must be combined with clinical observations, patient history, and epidemiological information.  Fact Sheet for Patients:   StrictlyIdeas.no  Fact Sheet for Healthcare Providers: BankingDealers.co.za  This test is not yet approved or  cleared by the [place] FDA and has been authorized for detection and/or diagnosis of SARS-CoV-2 by FDA under an Emergency Use Authorization (EUA).  This EUA will remain in effect (meaning this  test can be used) for the duration of the COVID-19 declaration under Section 564(b)(1) of the Act, 21 U.S.C. section 360bbb-3(b)(1), unless the authorization is terminated or revoked sooner.  Performed at [facility], [address], [place], [place] [address]   SARS Coronavirus 2 by RT PCR (hospital order, performed in [facility] hospital lab) Nasopharyngeal Nasopharyngeal Swab     Status: None   Collection Time: 08/10/20  2:33 PM   Specimen: Nasopharyngeal Swab  Result Value Ref Range Status   SARS Coronavirus 2 NEGATIVE NEGATIVE Final    Comment: (NOTE) SARS-CoV-2 target nucleic acids are NOT DETECTED.  The SARS-CoV-2 RNA is generally detectable in upper and lower respiratory specimens during the acute phase of infection. The lowest concentration of SARS-CoV-2 viral copies this assay can detect is 250 copies / mL. A negative result does not preclude SARS-CoV-2 infection and should not be used as the sole basis for treatment or other patient management decisions.  A negative result may occur with improper specimen collection /  handling, submission of specimen other than nasopharyngeal swab, presence of viral mutation(s) within the areas targeted by this assay, and inadequate number of viral copies (<250 copies / mL). A negative result must be combined with clinical observations, patient history, and epidemiological information.  Fact Sheet for Patients:   BoilerBrush.com.cy  Fact Sheet for Healthcare Providers: https://pope.com/  This test is not yet approved or  cleared by the [place] FDA and has been authorized for detection and/or diagnosis of SARS-CoV-2 by FDA under an Emergency Use Authorization (EUA).  This EUA will remain in effect (meaning this test can be used) for the duration of the COVID-19 declaration under Section 564(b)(1) of the Act, 21 U.S.C. section 360bbb-3(b)(1), unless the authorization is terminated or revoked sooner.  Performed at [facility], [address], [place], [place] [address]   MRSA PCR Screening     Status: None   Collection Time: 08/11/20  5:28 AM   Specimen: Nasal Mucosa; Nasopharyngeal  Result Value Ref Range Status   MRSA by PCR NEGATIVE NEGATIVE Final    Comment:        The GeneXpert MRSA Assay (FDA approved for NASAL specimens only), is one component of a comprehensive MRSA colonization surveillance program. It is not intended to diagnose MRSA infection nor to guide or monitor treatment for MRSA infections. Performed at [facility], 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 Electronically Signed   By: Jackey Loge DO   On: 08/13/2020 22:04    Result Date: 08/13/2020 CLINICAL DATA:  Provided history: Brain mass or lesion; brain lab protocol for surgery 921. EXAM: MRI HEAD WITH CONTRAST TECHNIQUE: Multiplanar, multiecho pulse sequences of the brain and surrounding structures were obtained with intravenous contrast. CONTRAST:  34mL GADAVIST GADOBUTROL 1 MMOL/ML IV SOLN COMPARISON:  Brain MRI with and without contrast 08/10/2020, head CT 08/10/2020. FINDINGS: Brain: Unchanged appearance of lobulated and infiltrative masslike T2/FLAIR hyperintense signal abnormality expanding the body of the corpus callosum with nodular extension along the septum pellucidum and contiguous spread along the margins of the frontal horns of both lateral ventricles. As before,  the lesion crosses midline via the body of the corpus callosum with centrum semiovale involvement (greater on the left) and frontal horn involvement (greater on the right). Also unchanged, there is corresponding heterogeneous contrast enhancement throughout the affected areas. Redemonstrated small areas of discontinuous petechial enhancement, most notably within the callosal splenium. Unchanged suspicious abnormal leptomeningeal enhancement with both internal auditory canals and questionable abnormal leptomeningeal enhancement along the ventral brainstem. Background mild scattered T2/FLAIR hyperintensity within the cerebral white matter and brainstem is nonspecific, but most commonly seen on the basis of chronic small vessel ischemia. Vascular: Expected enhancement within the proximal large arterial vessels and dural venous sinuses. Skull and upper cervical spine: No suspicious focal marrow lesion is identified on the acquired sequences. Sinuses/Orbits: No appreciable acute orbital abnormality on the acquired sequences. Mild paranasal sinus mucosal thickening, most notably ethmoidal. Trace fluid within left mastoid air cells. IMPRESSION: Central callosal, septal and periventricular infiltrative an  enhancing tumor most suspicious for glioblastoma/high-grade glioma, unchanged as compared to 08/10/2020. Lymphoma can have a similar imaging appearance, but is considered less likely. 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 Electronically Signed: By: Kellie Simmering DO On: 08/13/2020 21:56        Scheduled Meds: . amLODipine  10 mg Oral Daily  . aspirin EC  81 mg Oral BID  . Chlorhexidine Gluconate Cloth  6 each Topical Daily  . docusate sodium  100 mg Oral BID  . insulin aspart  0-15 Units Subcutaneous TID WC  . insulin aspart  0-5 Units Subcutaneous QHS   Continuous Infusions: . sodium chloride 50 mL/hr at 08/14/20 0843     LOS: 4 days    Time spent: 35 minutes spent on chart review, discussion with nursing staff, consultants, updating family and interview/physical exam; more than 50% of that time was spent in counseling and/or coordination of care.    Timia Casselman J [place] DO [facility] Available via Epic secure chat 7am-7pm After these hours, please refer to coverage provider listed on [email or web address] 08/14/2020, 12:32 PM

## 2020-08-14 NOTE — Op Note (Signed)
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 Postoperative diagnosis: Same Frozen section diagnosis: High-grade glial tumor Procedure: Left frontal stereotactic brain biopsy with MRI guidance Surgeon: Kristeen Miss First Assistant: Duffy Rhody, MD Second assistant: Osie Cheeks nurse practitioner Anesthesia: General endotracheal Indications: Susan Odom is a 74 year old individual whose had increasing periods of confusion along with pain in her right leg.  A CT scan demonstrated the presence of a lesion in the left frontal lobe with extension to the corpus callosum.  MRI confirmed that this looked like either a multifocal glioblastoma with extension into the leptomeninges or possibly a lymphoma.  A stereotactic brain biopsy was advised.  Procedure: The patient was brought to the operating room supine on the stretcher.  After the smooth induction of general endotracheal anesthesia she was placed in a 3 pin headrest in the supine position with the head slightly flexed.  Registration of her MRI with the Brain[facility] stereotactic unit was then performed and the trajectory toward towards the lesion from an entry site chosen on the scalp was obtained.  The scalp was then shaved in the area of the entry site and prepped with alcohol and DuraPrep and draped in a sterile fashion.  The arc holder was then placed over the patient's head at the region of the chosen entry site and the arc was adjusted to conform to the chosen tract.  Once this was performed the small incision was created in the chosen entry site after infiltrating with 5 cc of lidocaine with epinephrine.  A twist drill bur hole was then created in this region.  The dura was identified and punctured with a sharp needle.  Then the stereotactic catheter was passed to the lesion site and centered on the lesion trajectory.  Biopsies were taken with a millimeter and  a half core measuring 10 mm in length.  Initial biopsy was rather gelatinous and some hemorrhage was involved as progressive biopsies were taken from all 4 quadrants the lesion became much more hemorrhagic gelatinous tumor with evidence of what I suspect was necrosis was obtained.  A portion of this was sent for frozen section.  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  Multiple biopsies were taken from all 4 quadrants 1 approximately 12 passes of biopsy were obtained we decided to complete the procedure.  This was before the frozen section was returned.  The biopsy probe was removed hemostasis was imminently obtained and the scalp was closed with 3-0 Vicryl in an interrupted fashion.  The patient was then removed from the 3 pin head holder and allowed to be awakened in the operating room.  Frozen section diagnosis was then returned with the diagnosis of high-grade glial tumor.

## 2020-08-14 NOTE — Progress Notes (Signed)
  Subjective: Patient stable.  No new complaints.  Knee pain persisting but denies increase in pain, fevers, chills.  Complains of headache. Having brain biopsy today.   Objective: Vital signs in last 24 hours: Temp:  [97.6 F (36.4 C)-98.6 F (37 C)] 97.6 F (36.4 C) (09/21 0804) Pulse Rate:  [78-97] 80 (09/21 0804) Resp:  [15-18] 15 (09/21 0804) BP: (130-166)/(60-85) 150/65 (09/21 0804) SpO2:  [90 %-94 %] 90 % (09/21 0804) Weight:  [99.8 kg-104.2 kg] 104.2 kg (09/21 0600)  Intake/Output from previous day: 09/20 0701 - 09/21 0700 In: 360 [P.O.:360] Out: 1700 [Urine:1700] Intake/Output this shift: No intake/output data recorded.  Exam:  Dressings intact without gross blood drainage DF/PF intact to operative extremity 1+ DP pulse  Labs: Recent Labs    08/12/20 0648 08/13/20 0150 08/14/20 0446  HGB 10.8* 10.2* 10.5*   Recent Labs    08/13/20 0150 08/14/20 0446  WBC 7.4 6.0  RBC 3.42* 3.47*  HCT 31.6* 32.2*  PLT 152 166   Recent Labs    08/13/20 0150 08/14/20 0446  NA 138 134*  K 3.4* 3.5  CL 103 99  CO2 25 26  BUN 10 9  CREATININE 0.48 0.55  GLUCOSE 188* 186*  CALCIUM 9.3 9.2   Recent Labs    08/13/20 0150 08/14/20 0446  INR 1.2 1.1    Assessment/Plan: Continue TDWB for transfers only with knee immobilizer.  Brain biopsy by Neurosurgery today. Disposition per medical team, appreciate management   Markavious Micco L Shawnta Schlegel 08/14/2020, 10:26 AM

## 2020-08-14 NOTE — Progress Notes (Addendum)
Pt arrived from PACU with glasses and personal blanket.   When asked her name, pt responded "Susan Odom" I asked "It's not [name]?" and she said "It used to be" When asked for her real name she said "You'll see" Stated her last name was "[name]" and shook her head when I asked if she meant "[place]" For location, pt responded "Hell if I know"  Was told in report that patient was confused but could answer all four orientation questions appropriately.  Will continue to monitor.

## 2020-08-14 NOTE — Anesthesia Procedure Notes (Signed)
Arterial Line Insertion Start/End9/21/2021 2:15 PM, 08/14/2020 2:25 PM Performed by: Cleda Daub, CRNA, CRNA  Patient location: Pre-op. Preanesthetic checklist: patient identified, IV checked, site marked, risks and benefits discussed, surgical consent, monitors and equipment checked, pre-op evaluation and anesthesia consent Lidocaine 1% used for infiltration Left, radial was placed Catheter size: 20 Fr Hand hygiene performed , maximum sterile barriers used  and Seldinger technique used Allen's test indicative of satisfactory collateral circulation Attempts: 1 Procedure performed without using ultrasound guided technique. Ultrasound Notes:anatomy identified, needle tip was noted to be adjacent to the nerve/plexus identified and no ultrasound evidence of intravascular and/or intraneural injection Following insertion, dressing applied. Post procedure assessment: normal and unchanged  Patient tolerated the procedure well with no immediate complications. Additional procedure comments: Griffin Basil, SRNA placed. Marland Kitchen

## 2020-08-14 NOTE — Progress Notes (Signed)
spoke with pt daughter, Susan Odom, and she wishes to be updated with pt care and progress, will continue to monitor.

## 2020-08-15 ENCOUNTER — Encounter (HOSPITAL_COMMUNITY): Payer: Self-pay | Admitting: Neurological Surgery

## 2020-08-15 ENCOUNTER — Inpatient Hospital Stay (HOSPITAL_COMMUNITY): Payer: Medicare Other

## 2020-08-15 ENCOUNTER — Other Ambulatory Visit: Payer: Self-pay | Admitting: Radiation Therapy

## 2020-08-15 DIAGNOSIS — R930 Abnormal findings on diagnostic imaging of skull and head, not elsewhere classified: Secondary | ICD-10-CM

## 2020-08-15 DIAGNOSIS — S72331A Displaced oblique fracture of shaft of right femur, initial encounter for closed fracture: Secondary | ICD-10-CM

## 2020-08-15 LAB — CBC
HCT: 34.1 % — ABNORMAL LOW (ref 36.0–46.0)
Hemoglobin: 11.3 g/dL — ABNORMAL LOW (ref 12.0–15.0)
MCH: 31 pg (ref 26.0–34.0)
MCHC: 33.1 g/dL (ref 30.0–36.0)
MCV: 93.4 fL (ref 80.0–100.0)
Platelets: 184 10*3/uL (ref 150–400)
RBC: 3.65 MIL/uL — ABNORMAL LOW (ref 3.87–5.11)
RDW: 14.3 % (ref 11.5–15.5)
WBC: 5.4 10*3/uL (ref 4.0–10.5)
nRBC: 0 % (ref 0.0–0.2)

## 2020-08-15 LAB — BASIC METABOLIC PANEL
Anion gap: 14 (ref 5–15)
BUN: 13 mg/dL (ref 8–23)
CO2: 22 mmol/L (ref 22–32)
Calcium: 9.3 mg/dL (ref 8.9–10.3)
Chloride: 99 mmol/L (ref 98–111)
Creatinine, Ser: 0.6 mg/dL (ref 0.44–1.00)
GFR calc Af Amer: >60 mL/min (ref >60)
GFR calc non Af Amer: >60 mL/min (ref >60)
Glucose, Bld: 214 mg/dL — ABNORMAL HIGH (ref 70–99)
Potassium: 4.1 mmol/L (ref 3.5–5.1)
Sodium: 135 mmol/L (ref 135–145)

## 2020-08-15 LAB — GLUCOSE, CAPILLARY
Glucose-Capillary: 195 mg/dL — ABNORMAL HIGH (ref 70–99)
Glucose-Capillary: 197 mg/dL — ABNORMAL HIGH (ref 70–99)
Glucose-Capillary: 196 mg/dL — ABNORMAL HIGH (ref 70–99)
Glucose-Capillary: 234 mg/dL — ABNORMAL HIGH (ref 70–99)

## 2020-08-15 LAB — PROTIME-INR
INR: 1.2 (ref 0.8–1.2)
Prothrombin Time: 14.7 seconds (ref 11.4–15.2)

## 2020-08-15 LAB — MAGNESIUM: Magnesium: 2.1 mg/dL (ref 1.7–2.4)

## 2020-08-15 MED ORDER — INSULIN DETEMIR 100 UNIT/ML ~~LOC~~ SOLN
6.0000 [IU] | Freq: Every day | SUBCUTANEOUS | Status: DC
  Administered 2020-08-15: 6 [IU] via SUBCUTANEOUS
  Filled 2020-08-15 (×2): qty 0.06

## 2020-08-15 MED ORDER — ORAL CARE MOUTH RINSE
15.0000 mL | Freq: Two times a day (BID) | OROMUCOSAL | Status: DC
  Administered 2020-08-15 – 2020-08-21 (×10): 15 mL via OROMUCOSAL

## 2020-08-15 NOTE — Progress Notes (Signed)
Orthopedic Tech Progress Note Patient Details:  Susan Odom [birth year] [id number] Applied CPM to patient 0 to 30 degrees as ordered. CPM Right Knee CPM Right Knee: On Right Knee Flexion (Degrees): 30 Right Knee Extension (Degrees): 0  Post Interventions Patient Tolerated: Well Instructions Provided: Adjustment of device  [name] 08/15/2020, 10:35 AM

## 2020-08-15 NOTE — Progress Notes (Signed)
SLP Cancellation Note  Patient Details Name: JAQUAYA COYLE MRN: [id number] DOB: 1946-02-02   Cancelled treatment:       Reason Eval/Treat Not Completed: Medical issues which prohibited therapy. Pt not able to sit up for PO trials at this time. Will f/u for swallow evaluation later today.    Osie Bond., M.A. [facility] Pager [phone] Office [phone]  08/15/2020, 9:51 AM

## 2020-08-15 NOTE — Progress Notes (Signed)
[facility]                                                                              Patient Demographics  Susan Odom, is a 74 y.o. female, DOB - 11-Jun-1946, NOB:[id number]  Admit date - 08/10/2020   Admitting Physician Meredith Pel, MD  Outpatient Primary MD for the patient is Redmond School, MD  Outpatient specialists:   LOS - 5  days   Medical records reviewed and are as summarized below:    Chief Complaint  Patient presents with   Leg Pain       Brief summary   Susan Odom is a 74 year old female with past medical history notable for essential hypertension, diabetes, obesity, presented to ED with right knee pain for 2 weeks, progressive over the past 24 hours prior to admission.  Associated with swelling.  Patient reported a fall few days prior.  Additionally, daughter reported gradual onset of confusion and cognitive decline recently. Patient initially went to [facility], ED on 08/07/2020 with back pain, MRI notable for degenerative changes. UA concerning for UTI and was discharged home on Keflex, Percocet and prednisone.  Was seen by neurosurgery with worsening back pain and difficulty walking for 2 weeks.  She was unable to stand or bear weight in the office; and subsequently was sent to the emergency department for further evaluation and management.  In the ED, WBC count 14.0, sodium 134, ESR within normal limits, CRP 5.4, Covid-19 PCR negative.  X-ray right hip notable for severe displaced and comminuted oblique fracture distal right femur.  Chest x-ray with no acute findings.  CT right knee with mildly comminuted and slightly displaced fracture distal femoral metadiaphysis.  CT head with lobular masslike lesion predominantly along the corpus callosum with likely surrounding vasogenic edema concerning for intracranial neoplasm such as lymphoma or glioblastoma multiform a.  Neurosurgery and orthopedics were consulted.    Patient was admitted for  further work-up.  Assessment & Plan    Principal Problem:  Right femur fracture - Patient presented with right lower extremity pain and difficulty ambulating -Patient underwent ORIF with intramedullary retrograde femoral nailing on 9/18 by Dr. 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 --Neurosurgery, neuro oncology and palliative care consulted.  -Underwent left frontal stereotactic brain biopsy on 9/21, frozen section diagnosis high-grade glial tumor -Discussed with Dr. Ellene Route, recommended continue Decadron, Keppra due to high risk of seizures -Alert and oriented, self, place and person, not to time  Urinary tract infection -UA positive for UTI, urine culture  showed multiple species -Completed 3-day course of IV Rocephin on 9/20   Hypokalemia Resolved  Essential hypertension -Continue amlodipine, BP currently stable, holding home losartan -Hydralazine IV as needed with parameters  Type 2 diabetes mellitus On Invokana outpatient, will hold while inpatient. -CBGs currently uncontrolled due to steroids -Continue sliding scale insulin, added Levemir, 6 units at bedtime  Leukocytosis Patient afebrile with a WBC count of 14.0 on admission.     Etiology likely due to steroids, UTI  -Resolved  Obesity Estimated body mass index is 32.04 kg/m as calculated from the following:   Height as of this encounter: 5\' 11"  (1.803 m).   Weight as of this encounter: 104.2 kg.  Code Status: DNR DVT Prophylaxis:   SCD's Family Communication: Discussed all imaging results, lab results, explained to the patient   Disposition Plan:     Status is: Inpatient  Remains inpatient appropriate because:Inpatient level of care appropriate due to severity of illness   Dispo:  Patient From: Home  Planned Disposition: Home Hospice  Expected discharge date: 08/17/20  Medically stable for discharge: No       Time Spent in minutes 35 minutes  Procedures:  Left frontal stereotactic brain biopsy  Consultants:   Neurosurgery Neuro-oncology Orthopedics Palliative medicine  Antimicrobials:   Anti-infectives (From admission, onward)   Start     Dose/Rate Route Frequency Ordered Stop   08/14/20 1500  ceFAZolin (ANCEF) IVPB 2g/100 mL premix        2 g 200 mL/hr over 30 Minutes Intravenous  Once 08/14/20 1456 08/14/20 1528   08/14/20 1457  ceFAZolin (ANCEF) 2-4 GM/100ML-% IVPB       Note to Pharmacy: 08/16/20   : cabinet override      08/14/20 1457 08/14/20 1547   08/11/20 1600  ceFAZolin (ANCEF) IVPB 2g/100 mL premix        2 g 200 mL/hr over 30 Minutes Intravenous Every 6 hours 08/11/20 1436 08/11/20 2301   08/11/20 1430  cefTRIAXone (ROCEPHIN) 1 g in sodium chloride 0.9 % 100 mL IVPB        1 g 200 mL/hr over 30 Minutes Intravenous Every 24 hours 08/10/20 1758 08/13/20 1413   08/11/20 1300  cefTRIAXone (ROCEPHIN) 1 g in sodium chloride 0.9 % 100 mL IVPB  Status:  Discontinued        1 g 200 mL/hr over 30 Minutes Intravenous  Once 08/11/20 1258 08/11/20 1425   08/11/20 1100  vancomycin (VANCOCIN) powder  Status:  Discontinued          As needed 08/11/20 1100 08/11/20 1232   08/11/20 0910  ceFAZolin (ANCEF) IVPB 2g/100 mL premix         2 g 200 mL/hr over 30 Minutes Intravenous To [facility] 08/10/20 2227 08/11/20 0930   08/10/20 1430  cefTRIAXone (ROCEPHIN) 1 g in sodium chloride 0.9 % 100 mL IVPB        1 g 200 mL/hr over 30 Minutes Intravenous  Once 08/10/20 1417 08/10/20 1501          Medications  Scheduled Meds:  amLODipine  10 mg Oral Daily   Chlorhexidine Gluconate Cloth  6 each Topical Daily   dexamethasone (DECADRON) injection  4 mg Intravenous Q6H   Followed by   08/12/20 ON 08/16/2020] dexamethasone (DECADRON) injection  4 mg Intravenous Q8H   docusate sodium  100 mg Oral BID   insulin aspart  0-15 Units Subcutaneous TID WC   insulin aspart  0-5 Units Subcutaneous  QHS   losartan  100 mg Oral Daily   mouth rinse  15 mL Mouth Rinse BID   Continuous Infusions:  sodium chloride Stopped (08/15/20 0920)   levETIRAcetam Stopped (08/15/20 0630)   PRN Meds:.hydrALAZINE, menthol-cetylpyridinium **OR** phenol, metoCLOPramide **OR** metoCLOPramide (REGLAN) injection, ondansetron **OR** ondansetron (ZOFRAN) IV, oxyCODONE      Subjective:   Susan Odom was seen and examined today. Oriented to self, person and place.  Somewhat groggy but improving.  Denies any chest pain, shortness of breath, active nausea vomiting or diarrhea. /  Objective:   Vitals:   08/15/20 0900 08/15/20 0901 08/15/20 1000 08/15/20 1100  BP: (!) 143/76  129/61 129/67  Pulse: 85  62 67  Resp: (!) 29 (!) 22 19 (!) 21  Temp:      TempSrc:      SpO2: 98%  96% 97%  Weight:      Height:        Intake/Output Summary (Last 24 hours) at 08/15/2020 1150 Last data filed at 08/15/2020 1100 Gross per 24 hour  Intake 1923.72 ml  Output 1515 ml  Net 408.72 ml     Wt Readings from Last 3 Encounters:  08/14/20 104.2 kg  08/06/20 99.8 kg  07/28/20 102.1 kg     Exam  General: Alert and oriented to self, person, place (hospital)   Cardiovascular: S1 S2 auscultated, no murmurs, RRR  Respiratory: Clear to  auscultation bilaterally, no wheezing, rales or rhonchi  Gastrointestinal: Soft, nontender, nondistended, + bowel sounds  Ext: no pedal edema bilaterally  Neuro: no new deficits  Musculoskeletal: No digital cyanosis, clubbing  Skin: No rashes  Psych:   Data Reviewed:  I have personally reviewed following labs and imaging studies  Micro Results Recent Results (from the past 240 hour(s))  Urine Culture     Status: Abnormal   Collection Time: 08/07/20  7:22 AM   Specimen: Urine, Random  Result Value Ref Range Status   Specimen Description   Final    URINE, RANDOM Performed at [facility], [address], [place], [place] [address]    Special Requests   Final    NONE Performed at [facility], [address], [place], [place] [address]    Culture MULTIPLE SPECIES PRESENT, SUGGEST RECOLLECTION (A)  Final   Report Status 08/08/2020 FINAL  Final  SARS Coronavirus 2 by RT PCR (hospital order, performed in [facility] hospital lab) Nasopharyngeal Nasopharyngeal Swab     Status: None   Collection Time: 08/07/20 10:03 AM   Specimen: Nasopharyngeal Swab  Result Value Ref Range Status   SARS Coronavirus 2 NEGATIVE NEGATIVE Final    Comment: (NOTE) SARS-CoV-2 target nucleic acids are NOT DETECTED.  The SARS-CoV-2 RNA is generally detectable in upper and lower respiratory specimens during the acute phase of infection. The lowest concentration of SARS-CoV-2 viral copies this assay can detect is 250 copies / mL. A negative result does not preclude SARS-CoV-2 infection and should not be used as the sole basis for treatment or other patient management decisions.  A negative result may occur with improper specimen collection / handling, submission of specimen other than nasopharyngeal swab, presence of viral mutation(s) within the areas targeted by this assay, and inadequate number of viral copies (<250 copies / mL). A negative result must be combined with clinical observations, patient  history, and epidemiological information.  Fact Sheet for Patients:   StrictlyIdeas.no  Fact Sheet for Healthcare Providers: BankingDealers.co.za  This test is not yet approved or  cleared by the [place] and has been authorized for detection and/or diagnosis of SARS-CoV-2 by FDA under an Emergency Use Authorization (EUA).  This EUA will remain in effect (meaning this test can be used) for the duration of the COVID-19 declaration under Section 564(b)(1) of the Act, 21 U.S.C. section 360bbb-3(b)(1), unless the authorization is terminated or revoked sooner.  Performed at [facility], [address] [place], [place] [address]   SARS Coronavirus 2 by RT PCR (hospital order, performed in [facility] hospital lab) Nasopharyngeal Nasopharyngeal Swab     Status: None   Collection Time: 08/10/20  2:33 PM   Specimen: Nasopharyngeal Swab  Result Value Ref Range Status   SARS Coronavirus 2 NEGATIVE NEGATIVE Final    Comment: (NOTE) SARS-CoV-2 target nucleic acids are NOT DETECTED.  The SARS-CoV-2 RNA is generally detectable in upper and lower respiratory specimens during the acute phase of infection. The lowest concentration of SARS-CoV-2 viral copies this assay can detect is 250 copies / mL. A negative result does not preclude SARS-CoV-2 infection and should not be used as the sole basis for treatment or other patient management decisions.  A negative result may occur with improper specimen collection / handling, submission of specimen other than nasopharyngeal swab, presence of viral mutation(s) within the areas targeted by this assay, and inadequate number of viral copies (<250 copies / mL). A negative result must be combined with clinical observations, patient history, and epidemiological information.  Fact Sheet for Patients:   StrictlyIdeas.no  Fact Sheet for Healthcare  Providers: BankingDealers.co.za  This test is not yet approved or  cleared by the [place] FDA and has been authorized for detection and/or diagnosis of SARS-CoV-2 by FDA under an Emergency Use Authorization (EUA).  This EUA will remain in effect (meaning this test can be used) for the duration of the COVID-19 declaration under Section 564(b)(1) of the Act, 21 U.S.C. section 360bbb-3(b)(1), unless the authorization is terminated or revoked sooner.  Performed at [facility], [place] [address] [place], [place] [address]   MRSA PCR Screening     Status: None   Collection Time: 08/11/20  5:28 AM   Specimen: Nasal Mucosa; Nasopharyngeal  Result Value Ref Range Status   MRSA by PCR NEGATIVE NEGATIVE Final    Comment:        The GeneXpert MRSA Assay (FDA approved for NASAL specimens only), is one component of a comprehensive MRSA colonization surveillance program. It is not intended to diagnose MRSA infection nor to guide or monitor treatment for MRSA infections. Performed at [facility], [place] [address] [place], [place] [address]     Radiology Reports DG Chest 1 View  Result Date: 07/28/2020 CLINICAL DATA:  Fall last night. EXAM: CHEST  1 VIEW COMPARISON:  August 22, 2013. FINDINGS: Stable cardiomediastinal silhouette. Mild bibasilar subsegmental atelectasis is noted. No pneumothorax or pleural effusion is noted. Bony thorax is unremarkable. IMPRESSION: Mild bibasilar subsegmental atelectasis. Electronically Signed   By: Marijo Conception M.D.   On: 07/28/2020 12:47   DG Lumbar Spine Complete  Result Date: 07/28/2020 CLINICAL DATA:  Lower back pain after fall last night. EXAM: LUMBAR SPINE - COMPLETE 4+ VIEW COMPARISON:  None. FINDINGS: No fracture or significant spondylolisthesis is noted. Moderate to severe degenerative disc disease is noted at all levels of the lumbar spine. 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  No acute abnormality seen in the lumbar spine. Electronically Signed   By: Jeneen Rinks  Christen Butter M.D.   On: 07/28/2020 12:49   DG Knee 1-2 Views Right  Result Date: 08/10/2020 CLINICAL DATA:  Right knee pain after multiple falls. EXAM: RIGHT KNEE - 1-2 VIEW COMPARISON:  None. FINDINGS: Severely displaced and comminuted oblique fracture is seen involving the distal right femur. There is slight overriding of the fracture fragments. Degenerative changes are seen involving the right knee. IMPRESSION: Severely displaced and comminuted oblique fracture of distal right femur. Electronically Signed   By: Lupita Raider M.D.   On: 08/10/2020 13:24   DG Ankle Complete Left  Result Date: 08/10/2020 CLINICAL DATA:  Pain EXAM: LEFT ANKLE COMPLETE - 3+ VIEW COMPARISON:  None. FINDINGS: Bilateral soft tissue swelling.  No fracture or dislocation. IMPRESSION: Bilateral soft tissue swelling without fracture dislocation. Electronically Signed   By: Gerome Sam III M.D   On: 08/10/2020 19:48   DG Ankle Complete Right  Result Date: 08/11/2020 CLINICAL DATA:  Right hip pain. EXAM: RIGHT ANKLE - COMPLETE 3+ VIEW COMPARISON:  None. FINDINGS: Significant lateral soft tissue swelling identified. No displaced fractures are identified. There is a subtle lucency in the distal fibula medially which is favored to represent a prominent nutrient foramen. This lucency does not extend through the shaft of the distal fibula. The first lateral view was limited as the patient had a dressing noted posteriorly partially obscuring the posterior distal fibula. A repeat film was then obtained without the dressing. No definitive fractures are seen on today's study. Degenerative changes are seen in the mid and hindfoot. IMPRESSION: No convincing evidence of fracture. Degenerative changes in the mid and hindfoot. Electronically Signed   By: Gerome Sam III M.D   On: 08/11/2020 09:50   CT HEAD WO CONTRAST  Result Date: 08/15/2020 CLINICAL  DATA:  Intracranial mass EXAM: CT HEAD WITHOUT CONTRAST TECHNIQUE: Contiguous axial images were obtained from the base of the skull through the vertex without intravenous contrast. COMPARISON:  08/10/2020 FINDINGS: Brain: The nodular mass involving the septum pellucidum and extending into the genu of the corpus callosum anteriorly as well as the body and isthmus of the corpus callosum posteriorly, asymmetrically more invasive on the left, is again seen. Since the prior examination, there has developed punctate foci of gas as well as acute hemorrhage within the dominant, left colossal and deep white matter portion of the mass in keeping with stereotactic brain biopsy. A tiny burr hole is seen within the left parietal region and a tiny focus of hemorrhage is seen along the needle tract. The lobular hemorrhage within the mass extends into the left lateral ventricle with minimal layering hemorrhage and blood clot within the occipital horn. No significant mass effect noted. Minimal pneumocephalus within the left lateral ventricle related to biopsy. Ventricular size is normal.  Cerebellum unremarkable. Vascular: Unremarkable Skull: Otherwise intact Sinuses/Orbits: Paranasal sinuses are clear. Orbits are unremarkable. Other: Mastoid air cells and middle ear cavities are clear. IMPRESSION: Interval stereotactic biopsy of the left colossal/deep white matter portion of the infiltrative mass with mild lobular hemorrhage in the region of biopsy with slight intraventricular extension. No abnormal mass effect. Normal ventricular size. Electronically Signed   By: Helyn Numbers MD   On: 08/15/2020 07:23   CT Head Wo Contrast  Result Date: 08/10/2020 CLINICAL DATA:  Mental status change, history of falls EXAM: CT HEAD WITHOUT CONTRAST TECHNIQUE: Contiguous axial images were obtained from the base of the skull through the vertex without intravenous contrast. COMPARISON:  CT 10/17/2013 FINDINGS: Brain: Ill-defined slightly  hyperattenuating lobular masslike lesions about the corpus callosum,, interventricular septum and fornix with some hypoattenuating, likely vasogenic edema crossing genu of the corpus callosum and deep white matter adjacent the left lateral ventricle as well. Overall, incompletely characterized on the CT imaging. No areas of hyperdense hemorrhage. Some local mass effect and partial effacement of the lateral ventricles is noted. No extra-axial collection or hemorrhage is evident. Chronic mild expansion of the retro cerebellar CSF space likely reflecting arachnoid cyst versus mega cisterna magna. Symmetric prominence of the ventricles, cisterns and sulci compatible with parenchymal volume loss. Chronic small vessel ischemic changes are similar to priors. Vascular: Atherosclerotic calcification of the carotid siphons and intradural vertebral arteries. No hyperdense vessel. Skull: No calvarial fracture or suspicious osseous lesion. No scalp swelling or hematoma. Sinuses/Orbits: Paranasal sinuses and mastoid air cells are predominantly clear. Included orbital structures are unremarkable. Other: None IMPRESSION: Lobular masslike lesion centered predominantly along the corpus callosum, interventricular septum and fornix with likely surrounding vasogenic edema which crosses the genu of the corpus callosum and is also present the left frontal white matter. Primary differential considerations would include a intracranial neoplasm such as CNS lymphoma or glioblastoma multiforme. Overall, incompletely characterized on this unenhanced head CT. Recommend further evaluation with MRI with contrast. No other acute intracranial abnormality. Background of some mild chronic parenchymal volume loss and microvascular angiopathy changes with intracranial atherosclerosis. These results were called by telephone at the time of interpretation on 08/10/2020 at 4:01 pm to provider [facility] , who verbally acknowledged these results.  Electronically Signed   By: Kreg Shropshire M.D.   On: 08/10/2020 16:01   CT Knee Right Wo Contrast  Result Date: 08/10/2020 CLINICAL DATA:  Frequent falls.  Distal femur fracture. EXAM: CT OF THE RIGHT KNEE WITHOUT CONTRAST TECHNIQUE: Multidetector CT imaging of the right knee was performed according to the standard protocol. Multiplanar CT image reconstructions were also generated. COMPARISON:  Radiographs same date. FINDINGS: Bones/Joint/Cartilage Oblique fracture of the distal femoral metadiaphysis is again noted with mild comminution. This fracture is associated with up to 4.3 cm of medial displacement as well as significant overriding of the fracture fragments. There is no intra-articular extension of the fracture. The patella, proximal tibia and proximal fibula are intact. There are advanced tricompartmental degenerative changes at the knee with large osteophytes and intra-articular loose bodies. There is a small joint effusion. Ligaments Suboptimally assessed by CT. The anterior cruciate ligament is not well visualized. Muscles and Tendons The extensor mechanism is intact. Mild generalized muscular atrophy. Soft tissues Small hematoma adjacent to the fracture. No foreign body, soft tissue emphysema or bone destruction. IMPRESSION: 1. Mildly comminuted and significantly displaced fracture of the distal femoral metadiaphysis as described. No intra-articular extension of the fracture. 2. 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 Electronically Signed   By: Carey Bullocks M.D.   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 Electronically Signed    By: Jackey Loge DO   On: 08/13/2020 22:04   Result Date: 08/13/2020 CLINICAL DATA:  Provided history: Brain mass or lesion; brain lab protocol for surgery 921. EXAM: MRI HEAD WITH CONTRAST TECHNIQUE: Multiplanar, multiecho pulse sequences of the brain and  surrounding structures were obtained with intravenous contrast. CONTRAST:  7mL GADAVIST GADOBUTROL 1 MMOL/ML IV SOLN COMPARISON:  Brain MRI with and without contrast 08/10/2020, head CT 08/10/2020. FINDINGS: Brain: Unchanged appearance of lobulated and infiltrative masslike T2/FLAIR hyperintense signal abnormality expanding the body of the corpus callosum with nodular extension along the septum pellucidum and contiguous spread along the margins of the frontal horns of both lateral ventricles. As before, the lesion crosses midline via the body of the corpus callosum with centrum semiovale involvement (greater on the left) and frontal horn involvement (greater on the right). Also unchanged, there is corresponding heterogeneous contrast enhancement throughout the affected areas. Redemonstrated small areas of discontinuous petechial enhancement, most notably within the callosal splenium. Unchanged suspicious abnormal leptomeningeal enhancement with both internal auditory canals and questionable abnormal leptomeningeal enhancement along the ventral brainstem. Background mild scattered T2/FLAIR hyperintensity within the cerebral white matter and brainstem is nonspecific, but most commonly seen on the basis of chronic small vessel ischemia. Vascular: Expected enhancement within the proximal large arterial vessels and dural venous sinuses. Skull and upper cervical spine: No suspicious focal marrow lesion is identified on the acquired sequences. Sinuses/Orbits: No appreciable acute orbital abnormality on the acquired sequences. Mild paranasal sinus mucosal thickening, most notably ethmoidal. Trace fluid within left mastoid air cells. IMPRESSION: Central callosal, septal  and periventricular infiltrative an enhancing tumor most suspicious for glioblastoma/high-grade glioma, unchanged as compared to 08/10/2020. Lymphoma can have a similar imaging appearance, but is considered less likely. 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 Electronically Signed: By: Kellie Simmering DO On: 08/13/2020 21:56   MR BRAIN W WO CONTRAST  Result Date: 08/10/2020 CLINICAL DATA:  74 year old female with mental status changes, falls. Abnormal noncontrast head today suggesting a pericallosal mass. EXAM: MRI HEAD WITHOUT AND WITH CONTRAST TECHNIQUE: Multiplanar, multiecho pulse sequences of the brain and surrounding structures were obtained without and with intravenous contrast. CONTRAST:  42mL GADAVIST GADOBUTROL 1 MMOL/ML IV SOLN COMPARISON:  Head CT earlier today. Prior head CTs 10/17/2013 and earlier. FINDINGS: Brain: Lobulated, infiltrative masslike T2 and FLAIR hyperintense lesion expanding the body of the corpus callosum, with nodular extension along the septum pellucidum, and contiguous spread in and around the frontal horns of both lateral ventricles. The lesion crosses midline via the body of the corpus callosum, with left predominant centrum semiovale but right predominant frontal horn regional involvement. Much of the lesion shows mildly to moderately restricted diffusion but relative T2 hyperintensity. There is petechial hemorrhage in the central colo cell and pericallosal white matter portion on SWI (series 12, image 35). Following contrast there is heterogeneous enhancement throughout the affected areas. And there are occasional small discontinuous areas of petechial enhancement (most notably in the splenium of the corpus callosum on series 20, image 13. All told, the lesion encompasses roughly 69 x 51 x 36 mm (AP by transverse by CC). Despite the fairly extensive periventricular  configuration, no disseminated ependymal enhancement is identified. However, there is suspicious abnormal leptomeningeal enhancement in both internal auditory canals right greater than left (series 18, image 14). And there is also questionable abnormally increased leptomeningeal enhancement along the ventral brainstem (series 20, image 13). No other abnormal meningeal enhancement. No ventriculomegaly or transependymal edema. No significant intracranial mass effect. Basilar cisterns remain patent. No superimposed restricted diffusion suggestive of acute infarction. No extra-axial collection or acute intracranial hemorrhage. Cervicomedullary junction and pituitary are within normal limits. Vascular: Major intracranial vascular flow  voids are preserved, with generalized intracranial artery tortuosity. The major dural venous sinuses are enhancing and appear to be patent. Skull and upper cervical spine: Negative visible cervical spine and spinal cord. Normal bone marrow signal. Sinuses/Orbits: Negative. Other: Scalp and face appear negative. IMPRESSION: 1. 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 CNS lymphoma felt less likely. Area of involvement encompasses 69 x 51 x 36 mm. 2. 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 3. No significant intracranial mass effect. 4. Recommend Neuro-Oncology consultation. Electronically Signed   By: Genevie Ann M.D.   On: 08/10/2020 18:03   MR LUMBAR SPINE WO CONTRAST  Result Date: 08/07/2020 CLINICAL DATA:  Low back pain and bilateral leg pain over the last few days. EXAM: MRI LUMBAR SPINE WITHOUT CONTRAST TECHNIQUE: Multiplanar, multisequence MR imaging of the lumbar spine was performed. No intravenous contrast was administered. COMPARISON:  CT  07/28/2020 and 07/13/2020. FINDINGS: Segmentation:  5 lumbar type vertebral bodies. Alignment: Mild thoracolumbar curvature convex to the left and lower lumbar curvature convex to the right. No antero or retrolisthesis. Vertebrae: Old healed minor superior endplate compression deformity at L1 with maximal loss of height 40%. No retropulsed bone. No evidence of recent fracture in the region from inferior T11 to superior S3. Conus medullaris and cauda equina: Conus extends to the L2 level. Conus and cauda equina appear normal. Paraspinal and other soft tissues: No significant finding. Disc levels: Minimal non-compressive disc bulges at T11-12, T12-L1 and L1-2. L2-3: Moderate disc bulge. Slight indentation of the thecal sac but no apparent compressive stenosis. L3-4: Endplate osteophytes and moderate disc bulge more prominent towards the left. Mild narrowing of the lateral recesses but no visible compressive stenosis. L4-5: Endplate osteophytes and bulging of the disc. Mild narrowing of the lateral recesses but no compressive stenosis. L5-S1: Endplate osteophytes and bulging of the disc. No canal stenosis. Bilateral foraminal narrowing right more than left that would have some potential to affect either or both L5 nerves, more likely the right. IMPRESSION: 1. Old healed minor superior endplate compression deformity at L1 with maximal loss of height of 40%. No retropulsed bone. No evidence of recent fracture. No evidence of sacral insufficiency fracture as question on the previous CT. 2. 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 No compressive central canal stenosis. Bilateral foraminal narrowing at L5-S1 right worse than left that would have some potential to affect either or both L5 nerves, more likely the right. Electronically Signed   By: Nelson Chimes M.D.   On: 08/07/2020 09:24   CT ABDOMEN PELVIS W CONTRAST  Result Date: 07/28/2020 CLINICAL DATA:  Low back and right hip pain  since falling from bed last night. EXAM: CT ABDOMEN AND PELVIS WITH CONTRAST TECHNIQUE: Multidetector CT imaging of the abdomen and pelvis was performed using the standard protocol following bolus administration of intravenous contrast. CONTRAST:  127mL OMNIPAQUE IOHEXOL 300 MG/ML  SOLN COMPARISON:  Lumbar spine and right hip radiographs same date. CT lumbar spine 07/13/2020. FINDINGS: Lower chest: Atherosclerosis of the aorta and coronary arteries. Possible calcifications of the aortic valve. The heart size is normal. There is no significant pleural or pericardial effusion. Mild atelectasis is present at both lung bases. Hepatobiliary: The liver is normal in density without suspicious focal abnormality. No evidence of gallstones, gallbladder wall thickening or biliary dilatation. Pancreas: Unremarkable.  No pancreatic ductal dilatation or surrounding inflammatory changes. Spleen: Small central calcifications, likely granulomas. No splenomegaly or acute abnormality. Adrenals/Urinary Tract: Both adrenal glands appear normal. No evidence of acute kidney injury, urinary tract calculus or hydronephrosis. 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 Dependent high density in the right aspect of the bladder may reflect a small bladder calculus. No definite contrast excretion into the bladder. Stomach/Bowel: No evidence of bowel wall thickening, distention or surrounding inflammatory change. Vascular/Lymphatic: There are no enlarged abdominal or pelvic lymph nodes. No evidence of retroperitoneal hematoma. There is diffuse aortic and branch vessel atherosclerosis. As above, stable right renal artery aneurysm. Reproductive: Probable fiducial clips in the lower uterine segment. The uterus and ovaries otherwise appear normal. Other: Intact anterior abdominal wall. No ascites, hemoperitoneum or  pneumoperitoneum. Musculoskeletal: No acute or significant osseous findings. There is a stable chronic superior endplate compression deformity at L1. There is stable multilevel lumbar spondylosis. Stable sclerosis in the sacral ala bilaterally. IMPRESSION: 1. No acute findings or explanation for the patient's symptoms. 2. 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 3. Possible small bladder calculus. 4. Aortic Atherosclerosis (ICD10-I70.0). CT ADDITIONAL VIEWS (CT OF THE RIGHT HIP) Small field-of-view reformatted images through the right hip were obtained. There is no evidence of acute fracture or dislocation. There are mild right hip degenerative changes. There are stable degenerative changes at the right sacroiliac joint and bilateral sacral ala sclerosis. No significant soft tissue abnormalities in the proximal right thigh. Electronically Signed   By: Richardean Sale M.D.   On: 07/28/2020 14:20   DG Chest Portable 1 View  Result Date: 08/10/2020 CLINICAL DATA:  Altered mental status EXAM: PORTABLE CHEST 1 VIEW COMPARISON:  Radiograph 07/28/2020 FINDINGS: Chronically coarsened interstitial changes in the lungs are not significantly changed from comparison studies. These are slightly more pronounced in the left lung than right. No new focal consolidative opacity is seen. No visible pneumothorax or effusion though portion of the left costophrenic sulcus is collimated from view. Cardiac size is within normal limits for portable technique. The aorta is calcified. The remaining cardiomediastinal contours are unremarkable. No acute osseous or soft tissue abnormality. Degenerative changes are present in the imaged spine and shoulders. Telemetry leads and nasal cannula overlie the chest. IMPRESSION: Chronically coarsened interstitial changes in the lungs. No convincing new focal consolidative opacity. Electronically Signed   By: Lovena Le M.D.   On: 08/10/2020 15:08   DG C-Arm 1-60 Min  Result  Date: 08/11/2020 CLINICAL DATA:  Intramedullary nail placement. EXAM: RIGHT FEMUR 2 VIEWS; DG C-ARM 1-60 MIN COMPARISON:  08/10/2020 FINDINGS: Placement of right intramedullary femoral nail with proximal and distal anchoring screws. Hardware is intact as there is anatomic alignment over patient's distal femoral diametaphyseal fracture. Remainder the exam is unchanged. IMPRESSION: Internal fixation of distal femoral diametaphyseal fracture in anatomic alignment. Hardware intact and in adequate position. Electronically Signed   By: Marin Olp M.D.   On: 08/11/2020 13:45   CT NO CHARGE  Result Date: 07/28/2020 CLINICAL DATA:  Low back and right hip pain since falling from bed last night. EXAM: CT ABDOMEN AND PELVIS WITH CONTRAST TECHNIQUE: Multidetector CT imaging of the abdomen and pelvis was performed using the standard protocol following bolus administration of intravenous contrast. CONTRAST:  126mL OMNIPAQUE IOHEXOL 300 MG/ML  SOLN COMPARISON:  Lumbar spine and right hip radiographs same date. CT lumbar  spine 07/13/2020. FINDINGS: Lower chest: Atherosclerosis of the aorta and coronary arteries. Possible calcifications of the aortic valve. The heart size is normal. There is no significant pleural or pericardial effusion. Mild atelectasis is present at both lung bases. Hepatobiliary: The liver is normal in density without suspicious focal abnormality. No evidence of gallstones, gallbladder wall thickening or biliary dilatation. Pancreas: Unremarkable. No pancreatic ductal dilatation or surrounding inflammatory changes. Spleen: Small central calcifications, likely granulomas. No splenomegaly or acute abnormality. Adrenals/Urinary Tract: Both adrenal glands appear normal. No evidence of acute kidney injury, urinary tract calculus or hydronephrosis. 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 Dependent high density in the right aspect of the bladder may reflect a small bladder calculus. No definite contrast excretion into the bladder. Stomach/Bowel: No evidence of bowel wall thickening, distention or surrounding inflammatory change. Vascular/Lymphatic: There are no enlarged abdominal or pelvic lymph nodes. No evidence of retroperitoneal hematoma. There is diffuse aortic and branch vessel atherosclerosis. As above, stable right renal artery aneurysm. Reproductive: Probable fiducial clips in the lower uterine segment. The uterus and ovaries otherwise appear normal. Other: Intact anterior abdominal wall. No ascites, hemoperitoneum or pneumoperitoneum. Musculoskeletal: No acute or significant osseous findings. There is a stable chronic superior endplate compression deformity at L1. There is stable multilevel lumbar spondylosis. Stable sclerosis in the sacral ala bilaterally. IMPRESSION: 1. No acute findings or explanation for the patient's symptoms. 2. 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 3. Possible small bladder calculus. 4. Aortic Atherosclerosis (ICD10-I70.0). CT ADDITIONAL VIEWS (CT OF THE RIGHT HIP) Small field-of-view reformatted images through the right hip were obtained. There is no evidence of acute fracture or dislocation. There are mild right hip degenerative changes. There are stable degenerative changes at the right sacroiliac joint and bilateral sacral ala sclerosis. No significant soft tissue abnormalities in the proximal right thigh. Electronically Signed   By: Richardean Sale M.D.   On: 07/28/2020 14:20   DG HIP UNILAT WITH PELVIS 2-3 VIEWS RIGHT  Result Date: 08/10/2020 CLINICAL DATA:  Right hip pain. EXAM: DG HIP (WITH OR WITHOUT PELVIS) 2-3V RIGHT COMPARISON:  None. FINDINGS: There is no evidence of hip fracture or dislocation. There is no evidence of arthropathy or other focal bone abnormality. IMPRESSION: Negative. Electronically Signed   By: Dorise Bullion III M.D   On: 08/10/2020 19:42   DG HIP UNILAT WITH PELVIS 2-3 VIEWS RIGHT  Result Date: 08/07/2020 CLINICAL DATA:  Status post fall with subsequent right hip pain. EXAM: DG HIP (WITH OR WITHOUT PELVIS) 2-3V RIGHT COMPARISON:  July 28, 2020 FINDINGS: There is no evidence of an acute hip fracture or dislocation. The mild to moderate severity degenerative changes seen involving both hips. Radiopaque surgical clips are seen overlying the lower pelvis. IMPRESSION: No acute osseous abnormality. Electronically Signed   By: Virgina Norfolk M.D.   On: 08/07/2020 00:58   DG Hip Unilat W or Wo Pelvis 2-3 Views Right  Result Date: 07/28/2020 CLINICAL DATA:  Right hip pain after fall. EXAM: DG HIP (WITH OR WITHOUT PELVIS) 2-3V RIGHT COMPARISON:  None. FINDINGS: There is no evidence of hip fracture or dislocation. There is no evidence of arthropathy or other focal bone abnormality. IMPRESSION: Negative. Electronically Signed   By: Marijo Conception M.D.   On: 07/28/2020 12:46   DG FEMUR 1V RIGHT  Result Date: 08/11/2020 CLINICAL DATA:  Fixation of  right femoral fracture. EXAM: RIGHT FEMUR 1 VIEW COMPARISON:  08/10/2020 FINDINGS: Examination demonstrates evidence of patient's right femoral intramedullary nail ridging known distal femoral diametaphyseal fracture. There are proximal and distal anchoring screws. Hardware is intact with anatomic alignment over the fracture site. Exam is otherwise unchanged. IMPRESSION: Fixation of right femoral diametaphyseal fracture with hardware intact and anatomic alignment over the fracture site. Electronically Signed   By: Marin Olp M.D.   On: 08/11/2020 13:48   DG FEMUR, MIN 2 VIEWS RIGHT  Result Date: 08/11/2020 CLINICAL DATA:  Intramedullary nail placement. EXAM: RIGHT FEMUR 2 VIEWS; DG C-ARM 1-60 MIN COMPARISON:  08/10/2020 FINDINGS: Placement of right intramedullary femoral nail with proximal and distal anchoring screws. Hardware is intact as there is  anatomic alignment over patient's distal femoral diametaphyseal fracture. Remainder the exam is unchanged. IMPRESSION: Internal fixation of distal femoral diametaphyseal fracture in anatomic alignment. Hardware intact and in adequate position. Electronically Signed   By: Marin Olp M.D.   On: 08/11/2020 13:45    Lab Data:  CBC: Recent Labs  Lab 08/10/20 1405 08/12/20 0648 08/13/20 0150 08/14/20 0446 08/15/20 1001  WBC 14.0* 8.8 7.4 6.0 5.4  NEUTROABS 11.7*  --   --   --   --   HGB 13.7 10.8* 10.2* 10.5* 11.3*  HCT 42.6 32.9* 31.6* 32.2* 34.1*  MCV 92.6 94.3 92.4 92.8 93.4  PLT 218 161 152 166 330   Basic Metabolic Panel: Recent Labs  Lab 08/10/20 1405 08/10/20 1616 08/12/20 0648 08/13/20 0150 08/14/20 0446 08/15/20 1001  NA 134*  --  138 138 134* 135  K 4.4  --  4.1 3.4* 3.5 4.1  CL 97*  --  106 103 99 99  CO2 24  --  20* $Re'25 26 22  'ncs$ GLUCOSE 270*  --  182* 188* 186* 214*  BUN 26*  --  $R'14 10 9 13  'Bf$ CREATININE 0.78  --  0.64 0.48 0.55 0.60  CALCIUM 10.0  --  9.3 9.3 9.2 9.3  MG  --  2.0  --   --  1.7 2.1   GFR: Estimated Creatinine Clearance: 82 mL/min (by C-G formula based on SCr of 0.6 mg/dL). Liver Function Tests: Recent Labs  Lab 08/10/20 1405  AST 16  ALT 16  ALKPHOS 75  BILITOT 1.4*  PROT 6.8  ALBUMIN 3.3*   No results for input(s): LIPASE, AMYLASE in the last 168 hours. No results for input(s): AMMONIA in the last 168 hours. Coagulation Profile: Recent Labs  Lab 08/12/20 0648 08/13/20 0150 08/14/20 0446 08/15/20 1001  INR 1.2 1.2 1.1 1.2   Cardiac Enzymes: No results for input(s): CKTOTAL, CKMB, CKMBINDEX, TROPONINI in the last 168 hours. BNP (last 3 results) No results for input(s): PROBNP in the last 8760 hours. HbA1C: No results for input(s): HGBA1C in the last 72 hours. CBG: Recent Labs  Lab 08/14/20 1138 08/14/20 1328 08/14/20 2106 08/15/20 0721 08/15/20 1135  GLUCAP 158* 151* 174* 195* 197*   Lipid Profile: No results for  input(s): CHOL, HDL, LDLCALC, TRIG, CHOLHDL, LDLDIRECT in the last 72 hours. Thyroid Function Tests: No results for input(s): TSH, T4TOTAL, FREET4, T3FREE, THYROIDAB in the last 72 hours. Anemia Panel: No results for input(s): VITAMINB12, FOLATE, FERRITIN, TIBC, IRON, RETICCTPCT in the last 72 hours. Urine analysis:    Component Value Date/Time   COLORURINE AMBER (A) 08/10/2020 1730   APPEARANCEUR CLOUDY (A) 08/10/2020 1730   LABSPEC 1.028 08/10/2020 1730   PHURINE 5.0 08/10/2020 1730  GLUCOSEU >=500 (A) 08/10/2020 1730   HGBUR LARGE (A) 08/10/2020 1730   BILIRUBINUR NEGATIVE 08/10/2020 1730   KETONESUR 20 (A) 08/10/2020 1730   PROTEINUR NEGATIVE 08/10/2020 1730   UROBILINOGEN 0.2 08/23/2013 1404   NITRITE NEGATIVE 08/10/2020 1730   LEUKOCYTESUR LARGE (A) 08/10/2020 1730     Deepti Gunawan M.D. [facility] 08/15/2020, 11:50 AM   Call night coverage person covering after 7pm

## 2020-08-15 NOTE — Progress Notes (Signed)
Patient ID: Susan Odom, female   DOB: 10/25/1946, 74 y.o.   MRN: [id number] A bit difficult to arouse this morning but does so and moves all 4 extremities CT head this am shows area of hemorrhage around biopsy site  And some intraventricular blood. Mild increase in mass effect. Will await final path but this is clearly a high grade malignancy... Will maintain decadron for now.

## 2020-08-15 NOTE — Progress Notes (Signed)
Patient stable status post brain biopsy Pain controlled regarding right knee and femur Dressings intact.  Right lateral dressing to be changed today Patient tolerating CPM Plan for continued CPM.  Okay for touchdown weightbearing for transfers.  Follow-up in 14 days for suture removal

## 2020-08-15 NOTE — Anesthesia Postprocedure Evaluation (Signed)
Anesthesia Post Note  Patient: Susan Odom  Procedure(s) Performed: Left frontal stereotactic brain biopsy (Left Head) APPLICATION OF CRANIAL NAVIGATION (N/A )     Patient location during evaluation: PACU Anesthesia Type: General Level of consciousness: awake and alert Pain management: pain level controlled Vital Signs Assessment: post-procedure vital signs reviewed and stable Respiratory status: spontaneous breathing, nonlabored ventilation, respiratory function stable and patient connected to nasal cannula oxygen Cardiovascular status: blood pressure returned to baseline and stable Postop Assessment: no apparent nausea or vomiting Anesthetic complications: no   No complications documented.  Last Vitals:  Vitals:   08/15/20 1700 08/15/20 1800  BP: (!) 146/61 (!) 117/50  Pulse: 68 73  Resp: 18 (!) 21  Temp:    SpO2: 98% 94%    Last Pain:  Vitals:   08/15/20 1600  TempSrc: Axillary  PainSc:                  [place]

## 2020-08-15 NOTE — Evaluation (Signed)
Clinical/Bedside Swallow Evaluation Patient Details  Name: NAYELIE GIONFRIDDO MRN: [id number] Date of Birth: Aug 31, 1946  Today's Date: 08/15/2020 Time: SLP Start Time (ACUTE ONLY): 0017 SLP Stop Time (ACUTE ONLY): 1145 SLP Time Calculation (min) (ACUTE ONLY): 13 min  Past Medical History:  Past Medical History:  Diagnosis Date  . Diabetes mellitus   . Hypertension    Past Surgical History:  Past Surgical History:  Procedure Laterality Date  . BREAST SURGERY    . FEMUR IM NAIL Right 08/11/2020   Procedure: INTRAMEDULLARY (IM) RETROGRADE FEMORAL NAILING, with biomet cables;  Surgeon: Meredith Pel, MD;  Location: [facility];  Service: Orthopedics;  Laterality: Right;   HPI:  Pt is a 74 yo female with progressive difficulty walking, back pain, and confusion. Pt found to have R femur fx s/p ORIF as well as frontal brain lesion concerning for glioblastoma. 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 Pt underwent biopsy 9/21. PMH includes: HTN, DM, obesity   Assessment / Plan / Recommendation Clinical Impression  Pt was observed to have some coughing and difficulty swallowing post-operatively, but at the moment her swallowing function seems to have improved. She has some cognitive difficulty following commands for completion of an oral motor exam, but there is no overt asymmetry. Her vocal quality is quite soft - unclear how this compares to her norm. Her oral preparation is mildly prolonged, but suspect is related to xerostomia and condition of dentition. She is capable of clearing her mouth well, but benefits from liquid washes. This should improve with emphasis on more oral hygiene. Will continue with regular diet and thin liquids. SLP to sign off for swallowing - please reorder if there are any concerns or if a cognitive evaluation is wanted.   SLP Visit Diagnosis: Dysphagia, unspecified (R13.10)    Aspiration  Risk  Mild aspiration risk    Diet Recommendation Regular;Thin liquid   Liquid Administration via: Cup;Straw Medication Administration: Whole meds with liquid Supervision: Staff to assist with self feeding Compensations: Slow rate;Small sips/bites Postural Changes: Seated upright at 90 degrees    Other  Recommendations Oral Care Recommendations: Oral care BID   Follow up Recommendations None      Frequency and Duration            Prognosis        Swallow Study   General HPI: Pt is a 74 yo female with progressive difficulty walking, back pain, and confusion. Pt found to have R femur fx s/p ORIF as well as frontal brain lesion concerning for glioblastoma. 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 Pt underwent biopsy 9/21. PMH includes: HTN, DM, obesity Type of Study: Bedside Swallow Evaluation Previous Swallow Assessment: none in chart Diet Prior to this Study: Regular;Thin liquids Temperature Spikes Noted: No Respiratory Status: Nasal cannula History of Recent Intubation:  (for surgery only) Behavior/Cognition: Alert;Cooperative;Requires cueing Oral Cavity Assessment: Dry Oral Care Completed by SLP: No Oral Cavity - Dentition: Poor condition Self-Feeding Abilities: Needs assist Patient Positioning: Upright in bed Baseline Vocal Quality: Low vocal intensity    Oral/Motor/Sensory Function Overall Oral Motor/Sensory Function:  (not following commands well but no overt asymmetry)   Ice Chips Ice chips: Not tested   Thin Liquid Thin Liquid: Within functional limits Presentation: Cup;Self Fed;Straw    Nectar Thick Nectar Thick Liquid: Not tested   Honey Thick  Honey Thick Liquid: Not tested   Puree Puree: Within functional limits Presentation: Spoon   Solid     Solid: Within functional limits Presentation: Self Fed      Osie Bond., M.A. [place] Pager  [phone] Office [phone]  08/15/2020,1:11 PM

## 2020-08-15 NOTE — Progress Notes (Signed)
Physical Therapy Treatment Patient Details Name: Susan Odom MRN: [id number] DOB: July 22, 1946 Today's Date: 08/15/2020    History of Present Illness Susan Odom is a 74 y.o. female with medical history significant of hypertension, diabetes, obesity presented to ER for evaluation of right knee pain ongoing for 2 weeks leading up to this admission; she went to [facility] ED on 9/14 with back pain.  Had MRI done-which shows degenerative changes.  UA was concerning for UTI therefore she discharged home on Keflex, Percocet and prednisone and recommended outpatient neurosurgery follow-up.  Was seen by neurosurgery  morning of 9/17, due to worsening back pain and difficulty walking since 2 weeks.  At the office: She was not able to stand or bear weight, was extremely in pain,  not able to obtain radiographs therefore she was sent to ER for further evaluation and management.  Daughter reports gradual onset of confusion & cognitive decline recently.  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    PT Comments    Pt continues to have delayed processing, mild confusion, impaired sequencing, and very debilitated. Pt requiring totalAx2 for bed mobility. Recommend maxi-sky to transfer pt OOB at this time as pt not assisting with transfers at all. Unsure of ultimate medical plan as the biopsy results are pending. Acute PT to continue to follow patient until further notice.    Follow Up Recommendations  SNF;Other (comment)     Equipment Recommendations  Wheelchair (measurements PT);Wheelchair cushion (measurements PT);Hospital bed;Other (comment)    Recommendations for Other Services Other (comment)     Precautions / Restrictions Precautions Precautions: Fall Precaution Comments: has CPM Required  Braces or Orthoses: Knee Immobilizer - Right (during transfers) Knee Immobilizer - Right: On when out of bed or walking Restrictions Weight Bearing Restrictions: Yes RLE Weight Bearing: Touchdown weight bearing    Mobility  Bed Mobility Overal bed mobility: Needs Assistance Bed Mobility: Supine to Sit;Sit to Supine     Supine to sit: Total assist;+2 for physical assistance Sit to supine: Total assist;+2 for physical assistance   General bed mobility comments: pt with no initiation but with max verbal cues did attempt to help move LEs however ultimately totalx2 with HOB elevated for sup to sit  Transfers                 General transfer comment: defered lateral scoot transfer as pt unable to maintain sitting EOB or assist with scooting. Asked RN staff to use maxi-sky to transfer her OOB to chair  Ambulation/Gait                 Stairs             Wheelchair Mobility    Modified Rankin (Stroke Patients Only)       Balance Overall balance assessment: Needs assistance Sitting-balance support: Bilateral upper extremity supported Sitting balance-Leahy Scale: Poor Sitting balance - Comments: required maxA majority of time with a couple of episodes of minA however pt unable to hold                                    Cognition Arousal/Alertness: Awake/alert Behavior During Therapy: Flat affect Overall Cognitive Status: No family/caregiver present to  determine baseline cognitive functioning Area of Impairment: Orientation;Following commands;Problem solving                 Orientation Level: Place     Following Commands: Follows one step commands with increased time     Problem Solving: Slow processing;Difficulty sequencing;Decreased initiation;Requires verbal cues;Requires tactile cues General Comments: pt stated her last name was "Andree Elk", PT asked if that was her maiden name and she said "no it's [place]" when asked if her last  name was Dembeck she said yes.  Pt states hospital in [place] because she fell, stated september of 2021      Exercises General Exercises - Lower Extremity Quad Sets: [employer];Both;10 reps;Supine Long Arc Quad: AAROM;Both;10 reps;Seated Heel Slides: AAROM;Both;10 reps;Supine (R knee limited to about 25 deg)    General Comments General comments (skin integrity, edema, etc.): pts R knee dressings coming undone      Pertinent Vitals/Pain Pain Assessment: Faces Faces Pain Scale: Hurts whole lot Pain Location: chest/ribs with movement and some R LE with movement as well Pain Descriptors / Indicators: Grimacing;Guarding Pain Intervention(s): Monitored during session    Home Living                      Prior Function            PT Goals (current goals can now be found in the care plan section) Progress towards PT goals: Progressing toward goals    Frequency    Min 2X/week      PT Plan Current plan remains appropriate    Co-evaluation              AM-PAC PT "6 Clicks" Mobility   Outcome Measure  Help needed turning from your back to your side while in a flat bed without using bedrails?: Total Help needed moving from lying on your back to sitting on the side of a flat bed without using bedrails?: Total Help needed moving to and from a bed to a chair (including a wheelchair)?: Total Help needed standing up from a chair using your arms (e.g., wheelchair or bedside chair)?: Total Help needed to walk in hospital room?: Total Help needed climbing 3-5 steps with a railing? : Total 6 Click Score: 6    End of Session   Activity Tolerance: Patient limited by fatigue Patient left: in bed;with bed alarm set;with call bell/phone within reach Nurse Communication: Mobility status PT Visit Diagnosis: Other abnormalities of gait and mobility (R26.89);Pain Pain - Right/Left: Right Pain - part of body: Leg     Time: 1106-1130 PT Time Calculation (min) (ACUTE ONLY):  24 min  Charges:  $Therapeutic Exercise: 8-22 mins $Therapeutic Activity: 8-22 mins                     Kittie Plater, PT, DPT Acute Rehabilitation Services Pager #: [phone] Office #: [phone]    Berline Lopes 08/15/2020, 1:55 PM

## 2020-08-16 DIAGNOSIS — S72001A Fracture of unspecified part of neck of right femur, initial encounter for closed fracture: Secondary | ICD-10-CM

## 2020-08-16 LAB — PROTIME-INR
INR: 1.2 (ref 0.8–1.2)
Prothrombin Time: 14.3 seconds (ref 11.4–15.2)

## 2020-08-16 LAB — GLUCOSE, CAPILLARY
Glucose-Capillary: 229 mg/dL — ABNORMAL HIGH (ref 70–99)
Glucose-Capillary: 251 mg/dL — ABNORMAL HIGH (ref 70–99)
Glucose-Capillary: 271 mg/dL — ABNORMAL HIGH (ref 70–99)
Glucose-Capillary: 289 mg/dL — ABNORMAL HIGH (ref 70–99)
Glucose-Capillary: 310 mg/dL — ABNORMAL HIGH (ref 70–99)

## 2020-08-16 MED ORDER — INSULIN DETEMIR 100 UNIT/ML ~~LOC~~ SOLN
10.0000 [IU] | Freq: Every day | SUBCUTANEOUS | Status: DC
  Administered 2020-08-16: 10 [IU] via SUBCUTANEOUS
  Filled 2020-08-16 (×3): qty 0.1

## 2020-08-16 MED ORDER — INSULIN ASPART 100 UNIT/ML ~~LOC~~ SOLN
3.0000 [IU] | Freq: Three times a day (TID) | SUBCUTANEOUS | Status: DC
  Administered 2020-08-16 – 2020-08-18 (×6): 3 [IU] via SUBCUTANEOUS

## 2020-08-16 NOTE — Progress Notes (Signed)
Providing Compassionate, Quality Care - Together  Neurosurgery Service Progress Note  Subjective: No acute events overnight. No new complaints this morning  Objective: Vitals:   08/16/20 0900 08/16/20 1000 08/16/20 1100 08/16/20 1200  BP: (!) 136/51 (!) 107/43 (!) 142/72 (!) 154/65  Pulse: 64 72 63 68  Resp: 20 (!) 21 18 (!) 21  Temp:    98.3 F (36.8 C)  TempSrc:    Axillary  SpO2: 100% 100% 97% 99%  Weight:      Height:       Temp (24hrs), Avg:98 F (36.7 C), Min:97.5 F (36.4 C), Max:98.3 F (36.8 C)  CBC Latest Ref Rng & Units 08/15/2020 08/14/2020 08/13/2020  WBC 4.0 - 10.5 K/uL 5.4 6.0 7.4  Hemoglobin 12.0 - 15.0 g/dL 11.3(L) 10.5(L) 10.2(L)  Hematocrit 36 - 46 % 34.1(L) 32.2(L) 31.6(L)  Platelets 150 - 400 K/uL 184 166 152   BMP Latest Ref Rng & Units 08/15/2020 08/14/2020 08/13/2020  Glucose 70 - 99 mg/dL 214(H) 186(H) 188(H)  BUN 8 - 23 mg/dL 13 9 10   Creatinine 0.44 - 1.00 mg/dL 0.60 0.55 0.48  Sodium 135 - 145 mmol/L 135 134(L) 138  Potassium 3.5 - 5.1 mmol/L 4.1 3.5 3.4(L)  Chloride 98 - 111 mmol/L 99 99 103  CO2 22 - 32 mmol/L 22 26 25   Calcium 8.9 - 10.3 mg/dL 9.3 9.2 9.3    Intake/Output Summary (Last 24 hours) at 08/16/2020 1326 Last data filed at 08/16/2020 1100 Gross per 24 hour  Intake 826.99 ml  Output 1450 ml  Net -623.01 ml    Current Facility-Administered Medications:  .  0.9 %  sodium chloride infusion, , Intravenous, Continuous, Osie Cheeks, NP, Stopped at 08/16/20 1002 .  amLODipine (NORVASC) tablet 10 mg, 10 mg, Oral, Daily, Osie Cheeks, NP, 10 mg at 08/16/20 0850 .  Chlorhexidine Gluconate Cloth 2 % PADS 6 each, 6 each, Topical, Daily, Osie Cheeks, NP, 6 each at 08/16/20 1000 .  [COMPLETED] dexamethasone (DECADRON) injection 6 mg, 6 mg, Intravenous, Q6H, 6 mg at 08/15/20 1147 **FOLLOWED BY** [COMPLETED] dexamethasone (DECADRON) injection 4 mg, 4 mg, Intravenous, Q6H, 4 mg at 08/16/20 1236 **FOLLOWED BY** dexamethasone (DECADRON) injection 4  mg, 4 mg, Intravenous, Q8H, Cindi Ghazarian, NP .  docusate sodium (COLACE) capsule 100 mg, 100 mg, Oral, BID, Osie Cheeks, NP, 100 mg at 08/16/20 0850 .  hydrALAZINE (APRESOLINE) injection 10 mg, 10 mg, Intravenous, Q6H PRN, Osie Cheeks, NP .  insulin aspart (novoLOG) injection 0-15 Units, 0-15 Units, Subcutaneous, TID WC, Osie Cheeks, NP, 8 Units at 08/16/20 1236 .  insulin aspart (novoLOG) injection 0-5 Units, 0-5 Units, Subcutaneous, QHS, Osie Cheeks, NP, 2 Units at 08/15/20 2224 .  insulin aspart (novoLOG) injection 3 Units, 3 Units, Subcutaneous, TID WC, Rai, Ripudeep K, MD, 3 Units at 08/16/20 1236 .  insulin detemir (LEVEMIR) injection 10 Units, 10 Units, Subcutaneous, QHS, Rai, Ripudeep K, MD .  levETIRAcetam (KEPPRA) IVPB 500 mg/100 mL premix, 500 mg, Intravenous, Q12H, Osie Cheeks, NP, Stopped at 08/16/20 [phone] .  losartan (COZAAR) tablet 100 mg, 100 mg, Oral, Daily, Osie Cheeks, NP, 100 mg at 08/16/20 0850 .  MEDLINE mouth rinse, 15 mL, Mouth Rinse, BID, Rai, Ripudeep K, MD, 15 mL at 08/16/20 0851 .  menthol-cetylpyridinium (CEPACOL) lozenge 3 mg, 1 lozenge, Oral, PRN **OR** phenol (CHLORASEPTIC) mouth spray 1 spray, 1 spray, Mouth/Throat, PRN, Osie Cheeks, NP .  metoCLOPramide (REGLAN) tablet 5-10 mg, 5-10 mg, Oral, Q8H PRN **OR** metoCLOPramide (REGLAN) injection 5-10  mg, 5-10 mg, Intravenous, Q8H PRN, Osie Cheeks, NP .  ondansetron (ZOFRAN) tablet 4 mg, 4 mg, Oral, Q6H PRN **OR** ondansetron (ZOFRAN) injection 4 mg, 4 mg, Intravenous, Q6H PRN, Osie Cheeks, NP .  oxyCODONE (Oxy IR/ROXICODONE) immediate release tablet 5 mg, 5 mg, Oral, Q4H PRN, Osie Cheeks, NP, 5 mg at 08/16/20 0850   Physical Exam: Alert and oriented x3,somnolent, FC, moves all 4's,  PERRL, EOMI.  Incision c/d/i  Assessment & Plan: 74 y.o. female with PMH for essential hypertension,diabets, obesity, presented to ED on  08/10/2020 for right knee pain and gradual onset of confusion. CT head revealed lobular masslike lesion centered  predominantly along the corpus callosum, interventricular septum and fornix with surrounding vasogenic edema which crossed the genu of corpus callosum and is also present the left frontal white matter. 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  -awaiting for path -transfer patient to orthopedics floor -continue decadron 4mg  q8h -SCDs/TEDs  Osie Cheeks, NP

## 2020-08-16 NOTE — Addendum Note (Signed)
Addendum  created 08/16/20 0254 by Nolon Nations, MD   Intraprocedure Staff edited

## 2020-08-16 NOTE — Progress Notes (Signed)
Patient ID: Susan Odom, female   DOB: 04-12-1946, 74 y.o.   MRN: [id number] Pathology is still pending Patient is relatively somnolent but will arouse with constant stimulation She will likely need substantial rehab Await permanent path Transfer to floor

## 2020-08-16 NOTE — Progress Notes (Signed)
Pt seen in room transferred from 4N. Pt alert/oriented to place/time in no acute distress. Dressing to right femur area CDI.

## 2020-08-16 NOTE — Progress Notes (Signed)
[facility]                                                                              Patient Demographics  Susan Odom, is a 74 y.o. female, DOB - June 18, 1946, MGN:[id number]  Admit date - 08/10/2020   Admitting Physician Meredith Pel, MD  Outpatient Primary MD for the patient is Redmond School, MD  Outpatient specialists:   LOS - 6  days   Medical records reviewed and are as summarized below:    Chief Complaint  Patient presents with   Leg Pain       Brief summary   Susan Odom is a 74 year old female with past medical history notable for essential hypertension, diabetes, obesity, presented to ED with right knee pain for 2 weeks, progressive over the past 24 hours prior to admission.  Associated with swelling.  Patient reported a fall few days prior.  Additionally, daughter reported gradual onset of confusion and cognitive decline recently. Patient initially went to [facility] ED on 08/07/2020 with back pain, MRI notable for degenerative changes. UA concerning for UTI and was discharged home on Keflex, Percocet and prednisone.  Was seen by neurosurgery with worsening back pain and difficulty walking for 2 weeks.  She was unable to stand or bear weight in the office; and subsequently was sent to the emergency department for further evaluation and management.  In the ED, WBC count 14.0, sodium 134, ESR within normal limits, CRP 5.4, Covid-19 PCR negative.  X-ray right hip notable for severe displaced and comminuted oblique fracture distal right femur.  Chest x-ray with no acute findings.  CT right knee with mildly comminuted and slightly displaced fracture distal femoral metadiaphysis.  CT head with lobular masslike lesion predominantly along the corpus callosum with likely surrounding vasogenic edema concerning for intracranial neoplasm such as lymphoma or glioblastoma multiform a.  Neurosurgery and orthopedics were consulted.    Patient was admitted for  further work-up.  Assessment & Plan    Principal Problem:  Right femur fracture - Patient presented with right lower extremity pain and difficulty ambulating -Patient underwent ORIF with intramedullary retrograde femoral nailing on 9/18 by Dr. Marlou Sa -Per orthopedics, TDWB RLE for transfers only x2 weeks, then weightbearing as tolerated per orthopedics --Oxycodone 5 mg every 4 hours as needed moderate pain, aspirin 81 mg twice daily for DVT prophylaxis --PT recommended SNF, SW consult placed -Transfer to telemetry, Ortho floor.  Stable.   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 --Neurosurgery, neuro oncology and palliative care consulted.  -Underwent left frontal stereotactic brain biopsy on 9/21, frozen section diagnosis high-grade glial tumor -Continue Decadron, Keppra, will follow neurosurgery recommendations for prednisone taper for disposition.  -Much more alert and awake today  Urinary tract infection -UA  positive for UTI, urine culture showed multiple species -Completed 3-day course of IV Rocephin on 9/20   Hypokalemia Resolved  Essential hypertension -Continue amlodipine, BP currently stable, holding home losartan -Hydralazine IV as needed with parameters  Type 2 diabetes mellitus On Invokana outpatient, will hold while inpatient. -CBGs still uncontrolled, likely due to steroids - continue sliding scale insulin, increase Levemir to 10 units at bedtime, added  meal coverage NovoLog 3 units 3 times daily AC  Leukocytosis Patient afebrile with a WBC count of 14.0 on admission.    Etiology likely due to steroids, UTI  -Resolved  ?  Aspiration SLP evaluation done on 9/22 recommended regular diet however patient coughing today with eating Changed to dysphagia 3 diet, will have repeat SLP evaluation  Obesity Estimated body mass index is 32.04 kg/m as calculated from the following:   Height as of this encounter: $RemoveBeforeD'5\' 11"'bkrBTceeOjqfEc$  (1.803 m).   Weight as of this encounter: 104.2 kg.  Code Status: DNR DVT Prophylaxis:   SCD's Family Communication: Discussed all imaging results, lab results, explained to the patient and daughter on the phone.   Disposition Plan:     Status is: Inpatient  Remains inpatient appropriate because:Inpatient level of care appropriate due to severity of illness   Dispo:  Patient From: Home  Planned Disposition: Home Hospice  Expected discharge date: 08/17/20  Medically stable for discharge: No, transfer to the orthopedics floor today,       Time Spent in minutes 35 minutes  Procedures:  Left frontal stereotactic brain biopsy  Consultants:   Neurosurgery Neuro-oncology Orthopedics Palliative medicine  Antimicrobials:   Anti-infectives (From admission, onward)   Start     Dose/Rate Route Frequency Ordered Stop   08/14/20 1500  ceFAZolin (ANCEF) IVPB 2g/100 mL premix        2 g 200 mL/hr over 30 Minutes Intravenous  Once 08/14/20 1456 08/14/20 1528   08/14/20 1457  ceFAZolin (ANCEF) 2-4 GM/100ML-% IVPB       Note to Pharmacy: Grace Blight   : cabinet override      08/14/20 1457 08/14/20 1547   08/11/20 1600  ceFAZolin (ANCEF) IVPB 2g/100 mL premix        2 g 200 mL/hr over 30 Minutes Intravenous Every 6 hours 08/11/20 1436 08/11/20 2301   08/11/20 1430  cefTRIAXone (ROCEPHIN) 1 g in sodium chloride 0.9 % 100 mL IVPB        1 g 200 mL/hr over 30 Minutes Intravenous Every 24 hours 08/10/20 1758 08/13/20 1413    08/11/20 1300  cefTRIAXone (ROCEPHIN) 1 g in sodium chloride 0.9 % 100 mL IVPB  Status:  Discontinued        1 g 200 mL/hr over 30 Minutes Intravenous  Once 08/11/20 1258 08/11/20 1425   08/11/20 1100  vancomycin (VANCOCIN) powder  Status:  Discontinued          As needed 08/11/20 1100 08/11/20 1232   08/11/20 0910  ceFAZolin (ANCEF) IVPB 2g/100 mL premix        2 g 200 mL/hr over 30 Minutes Intravenous To [facility] 08/10/20 2227 08/11/20 0930   08/10/20 1430  cefTRIAXone (ROCEPHIN) 1 g in sodium chloride 0.9 % 100 mL IVPB        1 g 200 mL/hr over 30 Minutes Intravenous  Once 08/10/20 1417 08/10/20 1501         Medications  Scheduled Meds:  amLODipine  10 mg Oral Daily   Chlorhexidine Gluconate Cloth  6 each  Topical Daily   dexamethasone (DECADRON) injection  4 mg Intravenous Q6H   Followed by   dexamethasone (DECADRON) injection  4 mg Intravenous Q8H   docusate sodium  100 mg Oral BID   insulin aspart  0-15 Units Subcutaneous TID WC   insulin aspart  0-5 Units Subcutaneous QHS   insulin detemir  6 Units Subcutaneous QHS   losartan  100 mg Oral Daily   mouth rinse  15 mL Mouth Rinse BID   Continuous Infusions:  sodium chloride 10 mL/hr at 08/16/20 0900   levETIRAcetam Stopped (08/16/20 0629)   PRN Meds:.hydrALAZINE, menthol-cetylpyridinium **OR** phenol, metoCLOPramide **OR** metoCLOPramide (REGLAN) injection, ondansetron **OR** ondansetron (ZOFRAN) IV, oxyCODONE      Subjective:   Susan Odom was seen and examined today.  Sitting up in the bed, eating with assistance, much more alert and oriented today.   Denies any chest pain, shortness of breath, active nausea vomiting or diarrhea.  No fevers  Objective:   Vitals:   08/16/20 0600 08/16/20 0700 08/16/20 0800 08/16/20 0900  BP: 140/65 138/60 (!) 112/44 (!) 136/51  Pulse: 72 70 65 64  Resp: 16 18 (!) 25 20  Temp:   (!) 97.5 F (36.4 C)   TempSrc:   Oral   SpO2: 100% 100% 100% 100%    Weight:      Height:        Intake/Output Summary (Last 24 hours) at 08/16/2020 1044 Last data filed at 08/16/2020 0900 Gross per 24 hour  Intake 816.61 ml  Output 1250 ml  Net -433.39 ml     Wt Readings from Last 3 Encounters:  08/14/20 104.2 kg  08/06/20 99.8 kg  07/28/20 102.1 kg    Physical Exam  General: Alert and oriented x 3, NAD  Cardiovascular: S1 S2 clear, RRR. No pedal edema b/l  Respiratory: CTAB, no wheezing, rales or rhonchi  Gastrointestinal: Soft, nontender, nondistended, NBS  Ext: no pedal edema bilaterally  Neuro: no new deficits  Musculoskeletal: No cyanosis, clubbing  Skin: No rashes  Psych: Normal affect and demeanor, alert and oriented x3    Data Reviewed:  I have personally reviewed following labs and imaging studies  Micro Results Recent Results (from the past 240 hour(s))  Urine Culture     Status: Abnormal   Collection Time: 08/07/20  7:22 AM   Specimen: Urine, Random  Result Value Ref Range Status   Specimen Description   Final    URINE, RANDOM Performed at [facility], [address], [place], [place] [address]    Special Requests   Final    NONE Performed at [facility], [address] [place], [place] [address]    Culture MULTIPLE SPECIES PRESENT, SUGGEST RECOLLECTION (A)  Final   Report Status 08/08/2020 FINAL  Final  SARS Coronavirus 2 by RT PCR (hospital order, performed in [place] hospital lab) Nasopharyngeal Nasopharyngeal Swab     Status: None   Collection Time: 08/07/20 10:03 AM   Specimen: Nasopharyngeal Swab  Result Value Ref Range Status   SARS Coronavirus 2 NEGATIVE NEGATIVE Final    Comment: (NOTE) SARS-CoV-2 target nucleic acids are NOT DETECTED.  The SARS-CoV-2 RNA is generally detectable in upper and lower respiratory specimens during the acute phase of infection. The lowest concentration of SARS-CoV-2 viral copies this assay can detect is 250 copies / mL. A negative result does not preclude  SARS-CoV-2 infection and should not be used as the sole basis for treatment or other patient management decisions.  A negative result may occur with improper specimen collection / handling, submission of specimen other than nasopharyngeal swab, presence of viral mutation(s) within the areas targeted by this assay, and inadequate number of viral copies (<250 copies / mL). A negative result must be combined with clinical observations, patient history, and epidemiological information.  Fact Sheet for Patients:   BoilerBrush.com.cy  Fact Sheet for Healthcare Providers: https://pope.com/  This test is not yet approved or  cleared by the [place] FDA and has been authorized for detection and/or diagnosis of SARS-CoV-2 by FDA under an Emergency Use Authorization (EUA).  This EUA will remain in effect (meaning this test can be used) for the duration of the COVID-19 declaration under Section 564(b)(1) of the Act, 21 U.S.C. section 360bbb-3(b)(1), unless the authorization is terminated or revoked sooner.  Performed at [facility], [address], [place], [place] [address]   SARS Coronavirus 2 by RT PCR (hospital order, performed in [facility] hospital lab) Nasopharyngeal Nasopharyngeal Swab     Status: None   Collection Time: 08/10/20  2:33 PM   Specimen: Nasopharyngeal Swab  Result Value Ref Range Status   SARS Coronavirus 2 NEGATIVE NEGATIVE Final    Comment: (NOTE) SARS-CoV-2 target nucleic acids are NOT DETECTED.  The SARS-CoV-2 RNA is generally detectable in upper and lower respiratory specimens during the acute phase of infection. The lowest concentration of SARS-CoV-2 viral copies this assay can detect is 250 copies / mL. A negative result does not preclude SARS-CoV-2 infection and should not be used as the sole basis for treatment or other patient management decisions.  A negative result may occur with improper specimen  collection / handling, submission of specimen other than nasopharyngeal swab, presence of viral mutation(s) within the areas targeted by this assay, and inadequate number of viral copies (<250 copies / mL). A negative result must be combined with clinical observations, patient history, and epidemiological information.  Fact Sheet for Patients:   BoilerBrush.com.cy  Fact Sheet for Healthcare Providers: https://pope.com/  This test is not yet approved or  cleared by the [place] FDA and has been authorized for detection and/or diagnosis of SARS-CoV-2 by FDA under an Emergency Use Authorization (EUA).  This EUA will remain in effect (meaning this test can be used) for the duration of the COVID-19 declaration under Section 564(b)(1) of the Act, 21 U.S.C. section 360bbb-3(b)(1), unless the authorization is terminated or revoked sooner.  Performed at [facility], [address] [place], [place] [address]   MRSA PCR Screening     Status: None   Collection Time: 08/11/20  5:28 AM   Specimen: Nasal Mucosa; Nasopharyngeal  Result Value Ref Range Status   MRSA by PCR NEGATIVE NEGATIVE Final    Comment:        The GeneXpert MRSA Assay (FDA approved for NASAL specimens only), is one component of a comprehensive MRSA colonization surveillance program. It is not intended to diagnose MRSA infection nor to guide or monitor treatment for MRSA infections. Performed at [facility], [address] [place], [place] [address]     Radiology Reports DG Chest 1 View  Result Date: 07/28/2020 CLINICAL DATA:  Fall last night. EXAM: CHEST  1 VIEW COMPARISON:  August 22, 2013. FINDINGS: Stable cardiomediastinal silhouette. Mild bibasilar subsegmental atelectasis is noted. No pneumothorax or pleural effusion is noted. Bony thorax is unremarkable. IMPRESSION: Mild bibasilar subsegmental atelectasis. Electronically Signed   By: Lupita Raider M.D.   On: 07/28/2020  12:47   DG Lumbar Spine Complete  Result Date: 07/28/2020 CLINICAL DATA:  Lower back pain after fall last night. EXAM: LUMBAR SPINE - COMPLETE 4+ VIEW COMPARISON:  None. FINDINGS: No fracture or significant spondylolisthesis is noted. Moderate to severe degenerative disc disease is noted at all levels of the lumbar spine. 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 No acute abnormality seen in the lumbar spine. Electronically Signed   By: Marijo Conception M.D.   On: 07/28/2020 12:49   DG Knee 1-2 Views Right  Result Date: 08/10/2020 CLINICAL DATA:  Right knee pain after multiple falls. EXAM: RIGHT KNEE - 1-2 VIEW COMPARISON:  None. FINDINGS: Severely displaced and comminuted oblique fracture is seen involving the distal right femur. There is slight overriding of the fracture fragments. Degenerative changes are seen involving the right knee. IMPRESSION: Severely displaced and comminuted oblique fracture of distal right femur. Electronically Signed   By: Marijo Conception M.D.   On: 08/10/2020 13:24   DG Ankle Complete Left  Result Date: 08/10/2020 CLINICAL DATA:  Pain EXAM: LEFT ANKLE COMPLETE - 3+ VIEW COMPARISON:  None. FINDINGS: Bilateral soft tissue swelling.  No fracture or dislocation. IMPRESSION: Bilateral soft tissue swelling without fracture dislocation. Electronically Signed   By: Dorise Bullion III M.D   On: 08/10/2020 19:48   DG Ankle Complete Right  Result Date: 08/11/2020 CLINICAL DATA:  Right hip pain. EXAM: RIGHT ANKLE - COMPLETE 3+ VIEW COMPARISON:  None. FINDINGS: Significant lateral soft tissue swelling identified. No displaced fractures are identified. There is a subtle lucency in the distal fibula medially which is favored to represent a prominent nutrient foramen. This lucency does not extend through the shaft of the distal fibula. The first lateral view was limited as the patient had a dressing noted posteriorly partially  obscuring the posterior distal fibula. A repeat film was then obtained without the dressing. No definitive fractures are seen on today's study. Degenerative changes are seen in the mid and hindfoot. IMPRESSION: No convincing evidence of fracture. Degenerative changes in the mid and hindfoot. Electronically Signed   By: Dorise Bullion III M.D   On: 08/11/2020 09:50   CT HEAD WO CONTRAST  Result Date: 08/15/2020 CLINICAL DATA:  Intracranial mass EXAM: CT HEAD WITHOUT CONTRAST TECHNIQUE: Contiguous axial images were obtained from the base of the skull through the vertex without intravenous contrast. COMPARISON:  08/10/2020 FINDINGS: Brain: The nodular mass involving the septum pellucidum and extending into the genu of the corpus callosum anteriorly as well as the body and isthmus of the corpus callosum posteriorly, asymmetrically more invasive on the left, is again seen. Since the prior examination, there has developed punctate foci of gas as well as acute hemorrhage within the dominant, left colossal and deep white matter portion of the mass in keeping with stereotactic brain biopsy. A tiny burr hole is seen within the left parietal region and a tiny focus of hemorrhage is seen along the needle tract. The lobular hemorrhage within the mass extends into the left lateral ventricle with minimal layering hemorrhage and blood clot within the occipital horn. No significant mass effect noted. Minimal pneumocephalus within the left lateral ventricle related to biopsy. Ventricular size is normal.  Cerebellum unremarkable. Vascular: Unremarkable Skull: Otherwise intact Sinuses/Orbits: Paranasal sinuses are clear. Orbits are unremarkable. Other: Mastoid air cells and middle ear cavities are clear. IMPRESSION: Interval stereotactic biopsy of the left colossal/deep white matter portion of the infiltrative mass with mild lobular hemorrhage in the region  of biopsy with slight intraventricular extension. No abnormal mass effect.  Normal ventricular size. Electronically Signed   By: Fidela Salisbury MD   On: 08/15/2020 07:23   CT Head Wo Contrast  Result Date: 08/10/2020 CLINICAL DATA:  Mental status change, history of falls EXAM: CT HEAD WITHOUT CONTRAST TECHNIQUE: Contiguous axial images were obtained from the base of the skull through the vertex without intravenous contrast. COMPARISON:  CT 10/17/2013 FINDINGS: Brain: Ill-defined slightly hyperattenuating lobular masslike lesions about the corpus callosum,, interventricular septum and fornix with some hypoattenuating, likely vasogenic edema crossing genu of the corpus callosum and deep white matter adjacent the left lateral ventricle as well. Overall, incompletely characterized on the CT imaging. No areas of hyperdense hemorrhage. Some local mass effect and partial effacement of the lateral ventricles is noted. No extra-axial collection or hemorrhage is evident. Chronic mild expansion of the retro cerebellar CSF space likely reflecting arachnoid cyst versus mega cisterna magna. Symmetric prominence of the ventricles, cisterns and sulci compatible with parenchymal volume loss. Chronic small vessel ischemic changes are similar to priors. Vascular: Atherosclerotic calcification of the carotid siphons and intradural vertebral arteries. No hyperdense vessel. Skull: No calvarial fracture or suspicious osseous lesion. No scalp swelling or hematoma. Sinuses/Orbits: Paranasal sinuses and mastoid air cells are predominantly clear. Included orbital structures are unremarkable. Other: None IMPRESSION: Lobular masslike lesion centered predominantly along the corpus callosum, interventricular septum and fornix with likely surrounding vasogenic edema which crosses the genu of the corpus callosum and is also present the left frontal white matter. Primary differential considerations would include a intracranial neoplasm such as CNS lymphoma or glioblastoma multiforme. Overall, incompletely characterized  on this unenhanced head CT. Recommend further evaluation with MRI with contrast. No other acute intracranial abnormality. Background of some mild chronic parenchymal volume loss and microvascular angiopathy changes with intracranial atherosclerosis. These results were called by telephone at the time of interpretation on 08/10/2020 at 4:01 pm to provider [facility] , who verbally acknowledged these results. Electronically Signed   By: Lovena Le M.D.   On: 08/10/2020 16:01   CT Knee Right Wo Contrast  Result Date: 08/10/2020 CLINICAL DATA:  Frequent falls.  Distal femur fracture. EXAM: CT OF THE RIGHT KNEE WITHOUT CONTRAST TECHNIQUE: Multidetector CT imaging of the right knee was performed according to the standard protocol. Multiplanar CT image reconstructions were also generated. COMPARISON:  Radiographs same date. FINDINGS: Bones/Joint/Cartilage Oblique fracture of the distal femoral metadiaphysis is again noted with mild comminution. This fracture is associated with up to 4.3 cm of medial displacement as well as significant overriding of the fracture fragments. There is no intra-articular extension of the fracture. The patella, proximal tibia and proximal fibula are intact. There are advanced tricompartmental degenerative changes at the knee with large osteophytes and intra-articular loose bodies. There is a small joint effusion. Ligaments Suboptimally assessed by CT. The anterior cruciate ligament is not well visualized. Muscles and Tendons The extensor mechanism is intact. Mild generalized muscular atrophy. Soft tissues Small hematoma adjacent to the fracture. No foreign body, soft tissue emphysema or bone destruction. IMPRESSION: 1. Mildly comminuted and significantly displaced fracture of the distal femoral metadiaphysis as described. No intra-articular extension of the fracture. 2. 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  Electronically Signed   By: Richardean Sale M.D.   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 Electronically Signed   By: Kellie Simmering DO   On: 08/13/2020 22:04   Result Date: 08/13/2020 CLINICAL DATA:  Provided history: Brain mass or lesion; brain lab protocol for surgery 921. EXAM: MRI HEAD WITH CONTRAST TECHNIQUE: Multiplanar, multiecho pulse sequences of the brain and surrounding structures were obtained with intravenous contrast. CONTRAST:  45mL GADAVIST GADOBUTROL 1 MMOL/ML IV SOLN COMPARISON:  Brain MRI with and without contrast 08/10/2020, head CT 08/10/2020. FINDINGS: Brain: Unchanged appearance of lobulated and infiltrative masslike T2/FLAIR hyperintense signal abnormality expanding the body of the corpus callosum with nodular extension along the septum pellucidum and contiguous spread along the margins of the frontal horns of both lateral ventricles. As before, the lesion crosses midline via the body of the corpus callosum with centrum semiovale involvement (greater on the left) and frontal horn involvement (greater on the right). Also unchanged, there is corresponding heterogeneous contrast enhancement throughout the affected areas. Redemonstrated small areas of discontinuous petechial enhancement, most notably within the callosal splenium. Unchanged suspicious abnormal leptomeningeal enhancement with both internal auditory canals and questionable abnormal leptomeningeal enhancement along the ventral brainstem. Background mild scattered T2/FLAIR hyperintensity within the cerebral white matter and brainstem is nonspecific, but most commonly seen on the basis of chronic small vessel ischemia. Vascular: Expected  enhancement within the proximal large arterial vessels and dural venous sinuses. Skull and upper cervical spine: No suspicious focal marrow lesion is identified on the acquired sequences. Sinuses/Orbits: No appreciable acute orbital abnormality on the acquired sequences. Mild paranasal sinus mucosal thickening, most notably ethmoidal. Trace fluid within left mastoid air cells. IMPRESSION: Central callosal, septal and periventricular infiltrative an enhancing tumor most suspicious for glioblastoma/high-grade glioma, unchanged as compared to 08/10/2020. Lymphoma can have a similar imaging appearance, but is considered less likely. 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 Electronically Signed: By: Kellie Simmering DO On: 08/13/2020 21:56   MR BRAIN W WO CONTRAST  Result Date: 08/10/2020 CLINICAL DATA:  74 year old female with mental status changes, falls. Abnormal noncontrast head today suggesting a pericallosal mass. EXAM: MRI HEAD WITHOUT AND WITH CONTRAST TECHNIQUE: Multiplanar, multiecho pulse sequences of the brain and surrounding structures were obtained without and with intravenous contrast. CONTRAST:  73mL GADAVIST GADOBUTROL 1 MMOL/ML IV SOLN COMPARISON:  Head CT earlier today. Prior head CTs 10/17/2013 and earlier. FINDINGS: Brain: Lobulated, infiltrative masslike T2 and FLAIR hyperintense lesion expanding the body of the corpus callosum, with nodular extension along the septum pellucidum, and contiguous spread in and around the frontal horns of both lateral ventricles. The lesion crosses midline via the body of the corpus callosum, with left predominant centrum semiovale but right predominant frontal horn regional involvement. Much of the lesion shows mildly to moderately restricted diffusion but relative T2 hyperintensity. There is petechial hemorrhage in the central colo cell and pericallosal white  matter portion on SWI (series 12, image 35). Following contrast there is heterogeneous enhancement throughout the affected areas. And there are occasional small discontinuous areas of petechial enhancement (most notably in the splenium of the corpus callosum on series 20, image 13. All told, the lesion encompasses roughly 69 x 51 x 36 mm (AP by transverse by CC). Despite the fairly extensive periventricular configuration, no disseminated ependymal enhancement is identified. However, there is suspicious abnormal leptomeningeal  enhancement in both internal auditory canals right greater than left (series 18, image 14). And there is also questionable abnormally increased leptomeningeal enhancement along the ventral brainstem (series 20, image 13). No other abnormal meningeal enhancement. No ventriculomegaly or transependymal edema. No significant intracranial mass effect. Basilar cisterns remain patent. No superimposed restricted diffusion suggestive of acute infarction. No extra-axial collection or acute intracranial hemorrhage. Cervicomedullary junction and pituitary are within normal limits. Vascular: Major intracranial vascular flow voids are preserved, with generalized intracranial artery tortuosity. The major dural venous sinuses are enhancing and appear to be patent. Skull and upper cervical spine: Negative visible cervical spine and spinal cord. Normal bone marrow signal. Sinuses/Orbits: Negative. Other: Scalp and face appear negative. IMPRESSION: 1. 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 CNS lymphoma felt less likely. Area of involvement encompasses 69 x 51 x 36 mm. 2. 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 3. No significant intracranial mass effect. 4. Recommend  Neuro-Oncology consultation. Electronically Signed   By: Genevie Ann M.D.   On: 08/10/2020 18:03   MR LUMBAR SPINE WO CONTRAST  Result Date: 08/07/2020 CLINICAL DATA:  Low back pain and bilateral leg pain over the last few days. EXAM: MRI LUMBAR SPINE WITHOUT CONTRAST TECHNIQUE: Multiplanar, multisequence MR imaging of the lumbar spine was performed. No intravenous contrast was administered. COMPARISON:  CT 07/28/2020 and 07/13/2020. FINDINGS: Segmentation:  5 lumbar type vertebral bodies. Alignment: Mild thoracolumbar curvature convex to the left and lower lumbar curvature convex to the right. No antero or retrolisthesis. Vertebrae: Old healed minor superior endplate compression deformity at L1 with maximal loss of height 40%. No retropulsed bone. No evidence of recent fracture in the region from inferior T11 to superior S3. Conus medullaris and cauda equina: Conus extends to the L2 level. Conus and cauda equina appear normal. Paraspinal and other soft tissues: No significant finding. Disc levels: Minimal non-compressive disc bulges at T11-12, T12-L1 and L1-2. L2-3: Moderate disc bulge. Slight indentation of the thecal sac but no apparent compressive stenosis. L3-4: Endplate osteophytes and moderate disc bulge more prominent towards the left. Mild narrowing of the lateral recesses but no visible compressive stenosis. L4-5: Endplate osteophytes and bulging of the disc. Mild narrowing of the lateral recesses but no compressive stenosis. L5-S1: Endplate osteophytes and bulging of the disc. No canal stenosis. Bilateral foraminal narrowing right more than left that would have some potential to affect either or both L5 nerves, more likely the right. IMPRESSION: 1. Old healed minor superior endplate compression deformity at L1 with maximal loss of height of 40%. No retropulsed bone. No evidence of recent fracture. No evidence of sacral insufficiency fracture as question on the previous CT. 2. 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 No compressive central canal stenosis. Bilateral foraminal narrowing at L5-S1 right worse than left that would have some potential to affect either or both L5 nerves, more likely the right. Electronically Signed   By: Nelson Chimes M.D.   On: 08/07/2020 09:24   CT ABDOMEN PELVIS W CONTRAST  Result Date: 07/28/2020 CLINICAL DATA:  Low back and right hip pain since falling from bed last night. EXAM: CT ABDOMEN AND PELVIS WITH CONTRAST TECHNIQUE: Multidetector CT imaging of the abdomen and pelvis was performed using the standard protocol following bolus administration of intravenous contrast. CONTRAST:  151mL OMNIPAQUE IOHEXOL  300 MG/ML  SOLN COMPARISON:  Lumbar spine and right hip radiographs same date. CT lumbar spine 07/13/2020. FINDINGS: Lower chest: Atherosclerosis of the aorta and coronary arteries. Possible calcifications of the aortic valve. The heart size is normal. There is no significant pleural or pericardial effusion. Mild atelectasis is present at both lung bases. Hepatobiliary: The liver is normal in density without suspicious focal abnormality. No evidence of gallstones, gallbladder wall thickening or biliary dilatation. Pancreas: Unremarkable. No pancreatic ductal dilatation or surrounding inflammatory changes. Spleen: Small central calcifications, likely granulomas. No splenomegaly or acute abnormality. Adrenals/Urinary Tract: Both adrenal glands appear normal. No evidence of acute kidney injury, urinary tract calculus or hydronephrosis. 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 Dependent high density in the right aspect of the bladder may reflect a small bladder calculus. No definite contrast excretion into the bladder. Stomach/Bowel: No evidence of bowel wall thickening, distention or surrounding inflammatory change.  Vascular/Lymphatic: There are no enlarged abdominal or pelvic lymph nodes. No evidence of retroperitoneal hematoma. There is diffuse aortic and branch vessel atherosclerosis. As above, stable right renal artery aneurysm. Reproductive: Probable fiducial clips in the lower uterine segment. The uterus and ovaries otherwise appear normal. Other: Intact anterior abdominal wall. No ascites, hemoperitoneum or pneumoperitoneum. Musculoskeletal: No acute or significant osseous findings. There is a stable chronic superior endplate compression deformity at L1. There is stable multilevel lumbar spondylosis. Stable sclerosis in the sacral ala bilaterally. IMPRESSION: 1. No acute findings or explanation for the patient's symptoms. 2. 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 3. Possible small bladder calculus. 4. Aortic Atherosclerosis (ICD10-I70.0). CT ADDITIONAL VIEWS (CT OF THE RIGHT HIP) Small field-of-view reformatted images through the right hip were obtained. There is no evidence of acute fracture or dislocation. There are mild right hip degenerative changes. There are stable degenerative changes at the right sacroiliac joint and bilateral sacral ala sclerosis. No significant soft tissue abnormalities in the proximal right thigh. Electronically Signed   By: Richardean Sale M.D.   On: 07/28/2020 14:20   DG Chest Portable 1 View  Result Date: 08/10/2020 CLINICAL DATA:  Altered mental status EXAM: PORTABLE CHEST 1 VIEW COMPARISON:  Radiograph 07/28/2020 FINDINGS: Chronically coarsened interstitial changes in the lungs are not significantly changed from comparison studies. These are slightly more pronounced in the left lung than right. No new focal consolidative opacity is seen. No visible pneumothorax or effusion though portion of the left costophrenic sulcus is collimated from view. Cardiac size is within normal limits for portable technique. The aorta is calcified. The remaining cardiomediastinal  contours are unremarkable. No acute osseous or soft tissue abnormality. Degenerative changes are present in the imaged spine and shoulders. Telemetry leads and nasal cannula overlie the chest. IMPRESSION: Chronically coarsened interstitial changes in the lungs. No convincing new focal consolidative opacity. Electronically Signed   By: Lovena Le M.D.   On: 08/10/2020 15:08   DG C-Arm 1-60 Min  Result Date: 08/11/2020 CLINICAL DATA:  Intramedullary nail placement. EXAM: RIGHT FEMUR 2 VIEWS; DG C-ARM 1-60 MIN COMPARISON:  08/10/2020 FINDINGS: Placement of right intramedullary femoral nail with proximal and distal anchoring screws. Hardware is intact as there is anatomic alignment over patient's distal femoral diametaphyseal fracture. Remainder the exam is unchanged. IMPRESSION: Internal fixation of distal femoral diametaphyseal fracture in anatomic alignment. Hardware intact and in adequate position. Electronically Signed   By: Marin Olp M.D.  On: 08/11/2020 13:45   CT NO CHARGE  Result Date: 07/28/2020 CLINICAL DATA:  Low back and right hip pain since falling from bed last night. EXAM: CT ABDOMEN AND PELVIS WITH CONTRAST TECHNIQUE: Multidetector CT imaging of the abdomen and pelvis was performed using the standard protocol following bolus administration of intravenous contrast. CONTRAST:  198mL OMNIPAQUE IOHEXOL 300 MG/ML  SOLN COMPARISON:  Lumbar spine and right hip radiographs same date. CT lumbar spine 07/13/2020. FINDINGS: Lower chest: Atherosclerosis of the aorta and coronary arteries. Possible calcifications of the aortic valve. The heart size is normal. There is no significant pleural or pericardial effusion. Mild atelectasis is present at both lung bases. Hepatobiliary: The liver is normal in density without suspicious focal abnormality. No evidence of gallstones, gallbladder wall thickening or biliary dilatation. Pancreas: Unremarkable. No pancreatic ductal dilatation or surrounding  inflammatory changes. Spleen: Small central calcifications, likely granulomas. No splenomegaly or acute abnormality. Adrenals/Urinary Tract: Both adrenal glands appear normal. No evidence of acute kidney injury, urinary tract calculus or hydronephrosis. 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 Dependent high density in the right aspect of the bladder may reflect a small bladder calculus. No definite contrast excretion into the bladder. Stomach/Bowel: No evidence of bowel wall thickening, distention or surrounding inflammatory change. Vascular/Lymphatic: There are no enlarged abdominal or pelvic lymph nodes. No evidence of retroperitoneal hematoma. There is diffuse aortic and branch vessel atherosclerosis. As above, stable right renal artery aneurysm. Reproductive: Probable fiducial clips in the lower uterine segment. The uterus and ovaries otherwise appear normal. Other: Intact anterior abdominal wall. No ascites, hemoperitoneum or pneumoperitoneum. Musculoskeletal: No acute or significant osseous findings. There is a stable chronic superior endplate compression deformity at L1. There is stable multilevel lumbar spondylosis. Stable sclerosis in the sacral ala bilaterally. IMPRESSION: 1. No acute findings or explanation for the patient's symptoms. 2. 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 3. Possible small bladder calculus. 4. Aortic Atherosclerosis (ICD10-I70.0). CT ADDITIONAL VIEWS (CT OF THE RIGHT HIP) Small field-of-view reformatted images through the right hip were obtained. There is no evidence of acute fracture or dislocation. There are mild right hip degenerative changes. There are stable degenerative changes at the right sacroiliac joint and bilateral sacral ala sclerosis. No significant soft tissue abnormalities in the proximal right thigh. Electronically  Signed   By: Richardean Sale M.D.   On: 07/28/2020 14:20   DG HIP UNILAT WITH PELVIS 2-3 VIEWS RIGHT  Result Date: 08/10/2020 CLINICAL DATA:  Right hip pain. EXAM: DG HIP (WITH OR WITHOUT PELVIS) 2-3V RIGHT COMPARISON:  None. FINDINGS: There is no evidence of hip fracture or dislocation. There is no evidence of arthropathy or other focal bone abnormality. IMPRESSION: Negative. Electronically Signed   By: Dorise Bullion III M.D   On: 08/10/2020 19:42   DG HIP UNILAT WITH PELVIS 2-3 VIEWS RIGHT  Result Date: 08/07/2020 CLINICAL DATA:  Status post fall with subsequent right hip pain. EXAM: DG HIP (WITH OR WITHOUT PELVIS) 2-3V RIGHT COMPARISON:  July 28, 2020 FINDINGS: There is no evidence of an acute hip fracture or dislocation. The mild to moderate severity degenerative changes seen involving both hips. Radiopaque surgical clips are seen overlying the lower pelvis. IMPRESSION: No acute osseous abnormality. Electronically Signed   By: Virgina Norfolk M.D.   On: 08/07/2020 00:58   DG Hip Unilat W or Wo Pelvis 2-3 Views Right  Result Date: 07/28/2020 CLINICAL DATA:  Right hip pain after fall. EXAM: DG HIP (WITH OR WITHOUT PELVIS) 2-3V RIGHT COMPARISON:  None. FINDINGS: There is no evidence of hip fracture or dislocation. There is no evidence of arthropathy or other focal bone abnormality. IMPRESSION: Negative. Electronically Signed   By: Marijo Conception M.D.   On: 07/28/2020 12:46   DG FEMUR 1V RIGHT  Result Date: 08/11/2020 CLINICAL DATA:  Fixation of right femoral fracture. EXAM: RIGHT FEMUR 1 VIEW COMPARISON:  08/10/2020 FINDINGS: Examination demonstrates evidence of patient's right femoral intramedullary nail ridging known distal femoral diametaphyseal fracture. There are proximal and distal anchoring screws. Hardware is intact with anatomic alignment over the fracture site. Exam is otherwise unchanged. IMPRESSION: Fixation of right femoral diametaphyseal fracture with hardware intact and  anatomic alignment over the fracture site. Electronically Signed   By: Marin Olp M.D.   On: 08/11/2020 13:48   DG FEMUR, MIN 2 VIEWS RIGHT  Result Date: 08/11/2020 CLINICAL DATA:  Intramedullary nail placement. EXAM: RIGHT FEMUR 2 VIEWS; DG C-ARM 1-60 MIN COMPARISON:  08/10/2020 FINDINGS: Placement of right intramedullary femoral nail with proximal and distal anchoring screws. Hardware is intact as there is anatomic alignment over patient's distal femoral diametaphyseal fracture. Remainder the exam is unchanged. IMPRESSION: Internal fixation of distal femoral diametaphyseal fracture in anatomic alignment. Hardware intact and in adequate position. Electronically Signed   By: Marin Olp M.D.   On: 08/11/2020 13:45    Lab Data:  CBC: Recent Labs  Lab 08/10/20 1405 08/12/20 0648 08/13/20 0150 08/14/20 0446 08/15/20 1001  WBC 14.0* 8.8 7.4 6.0 5.4  NEUTROABS 11.7*  --   --   --   --   HGB 13.7 10.8* 10.2* 10.5* 11.3*  HCT 42.6 32.9* 31.6* 32.2* 34.1*  MCV 92.6 94.3 92.4 92.8 93.4  PLT 218 161 152 166 155   Basic Metabolic Panel: Recent Labs  Lab 08/10/20 1405 08/10/20 1616 08/12/20 0648 08/13/20 0150 08/14/20 0446 08/15/20 1001  NA 134*  --  138 138 134* 135  K 4.4  --  4.1 3.4* 3.5 4.1  CL 97*  --  106 103 99 99  CO2 24  --  20* $Re'25 26 22  'vBt$ GLUCOSE 270*  --  182* 188* 186* 214*  BUN 26*  --  $R'14 10 9 13  'ZA$ CREATININE 0.78  --  0.64 0.48 0.55 0.60  CALCIUM 10.0  --  9.3 9.3 9.2 9.3  MG  --  2.0  --   --  1.7 2.1   GFR: Estimated Creatinine Clearance: 82 mL/min (by C-G formula based on SCr of 0.6 mg/dL). Liver Function Tests: Recent Labs  Lab 08/10/20 1405  AST 16  ALT 16  ALKPHOS 75  BILITOT 1.4*  PROT 6.8  ALBUMIN 3.3*   No results for input(s): LIPASE, AMYLASE in the last 168 hours. No results for input(s): AMMONIA in the last 168 hours. Coagulation Profile: Recent Labs  Lab 08/12/20 0648 08/13/20 0150 08/14/20 0446 08/15/20 1001  INR 1.2 1.2 1.1 1.2    Cardiac Enzymes: No results for input(s): CKTOTAL, CKMB, CKMBINDEX, TROPONINI in the last 168 hours. BNP (last 3 results) No results for input(s): PROBNP in the last 8760 hours. HbA1C: No results for input(s): HGBA1C in the last 72 hours. CBG: Recent Labs  Lab 08/15/20 0721 08/15/20 1135 08/15/20 1530 08/15/20 2141 08/16/20 0754  GLUCAP 195* 197* 196* 234* 271*   Lipid Profile: No results for input(s): CHOL, HDL, LDLCALC, TRIG, CHOLHDL,  LDLDIRECT in the last 72 hours. Thyroid Function Tests: No results for input(s): TSH, T4TOTAL, FREET4, T3FREE, THYROIDAB in the last 72 hours. Anemia Panel: No results for input(s): VITAMINB12, FOLATE, FERRITIN, TIBC, IRON, RETICCTPCT in the last 72 hours. Urine analysis:    Component Value Date/Time   COLORURINE AMBER (A) 08/10/2020 1730   APPEARANCEUR CLOUDY (A) 08/10/2020 1730   LABSPEC 1.028 08/10/2020 1730   PHURINE 5.0 08/10/2020 1730   GLUCOSEU >=500 (A) 08/10/2020 1730   HGBUR LARGE (A) 08/10/2020 1730   BILIRUBINUR NEGATIVE 08/10/2020 1730   KETONESUR 20 (A) 08/10/2020 1730   PROTEINUR NEGATIVE 08/10/2020 1730   UROBILINOGEN 0.2 08/23/2013 1404   NITRITE NEGATIVE 08/10/2020 1730   LEUKOCYTESUR LARGE (A) 08/10/2020 1730     Zyan Coby M.D. [facility] 08/16/2020, 10:44 AM   Call night coverage person covering after 7pm

## 2020-08-16 NOTE — Progress Notes (Signed)
Patient's daughter, Esra Frankowski, notified that patient was transferred to [id number].

## 2020-08-16 NOTE — Plan of Care (Signed)

## 2020-08-17 DIAGNOSIS — G9389 Other specified disorders of brain: Secondary | ICD-10-CM

## 2020-08-17 LAB — CBC
HCT: 34.7 % — ABNORMAL LOW (ref 36.0–46.0)
Hemoglobin: 11.3 g/dL — ABNORMAL LOW (ref 12.0–15.0)
MCH: 30.4 pg (ref 26.0–34.0)
MCHC: 32.6 g/dL (ref 30.0–36.0)
MCV: 93.3 fL (ref 80.0–100.0)
Platelets: 205 10*3/uL (ref 150–400)
RBC: 3.72 MIL/uL — ABNORMAL LOW (ref 3.87–5.11)
RDW: 14.8 % (ref 11.5–15.5)
WBC: 7 10*3/uL (ref 4.0–10.5)
nRBC: 0 % (ref 0.0–0.2)

## 2020-08-17 LAB — GLUCOSE, CAPILLARY
Glucose-Capillary: 230 mg/dL — ABNORMAL HIGH (ref 70–99)
Glucose-Capillary: 262 mg/dL — ABNORMAL HIGH (ref 70–99)
Glucose-Capillary: 227 mg/dL — ABNORMAL HIGH (ref 70–99)
Glucose-Capillary: 330 mg/dL — ABNORMAL HIGH (ref 70–99)

## 2020-08-17 LAB — BASIC METABOLIC PANEL
Anion gap: 11 (ref 5–15)
BUN: 23 mg/dL (ref 8–23)
CO2: 29 mmol/L (ref 22–32)
Calcium: 10.2 mg/dL (ref 8.9–10.3)
Chloride: 100 mmol/L (ref 98–111)
Creatinine, Ser: 0.42 mg/dL — ABNORMAL LOW (ref 0.44–1.00)
GFR calc Af Amer: >60 mL/min (ref >60)
GFR calc non Af Amer: >60 mL/min (ref >60)
Glucose, Bld: 246 mg/dL — ABNORMAL HIGH (ref 70–99)
Potassium: 3.4 mmol/L — ABNORMAL LOW (ref 3.5–5.1)
Sodium: 140 mmol/L (ref 135–145)

## 2020-08-17 LAB — PROTIME-INR
INR: 1.1 (ref 0.8–1.2)
Prothrombin Time: 13.8 seconds (ref 11.4–15.2)

## 2020-08-17 LAB — SURGICAL PATHOLOGY

## 2020-08-17 MED ORDER — INSULIN DETEMIR 100 UNIT/ML ~~LOC~~ SOLN
14.0000 [IU] | Freq: Every day | SUBCUTANEOUS | Status: DC
  Administered 2020-08-17: 14 [IU] via SUBCUTANEOUS
  Filled 2020-08-17 (×2): qty 0.14

## 2020-08-17 MED ORDER — ENSURE ENLIVE PO LIQD
237.0000 mL | Freq: Two times a day (BID) | ORAL | Status: DC
  Administered 2020-08-17 – 2020-08-21 (×8): 237 mL via ORAL

## 2020-08-17 MED ORDER — ADULT MULTIVITAMIN W/MINERALS CH
1.0000 | ORAL_TABLET | Freq: Every day | ORAL | Status: DC
  Administered 2020-08-17 – 2020-08-21 (×5): 1 via ORAL
  Filled 2020-08-17 (×5): qty 1

## 2020-08-17 MED ORDER — POTASSIUM CHLORIDE CRYS ER 20 MEQ PO TBCR
40.0000 meq | EXTENDED_RELEASE_TABLET | Freq: Once | ORAL | Status: AC
  Administered 2020-08-17: 40 meq via ORAL
  Filled 2020-08-17: qty 2

## 2020-08-17 NOTE — Progress Notes (Signed)
Inpatient Diabetes Program Recommendations  AACE/ADA: New Consensus Statement on Inpatient Glycemic Control (2015)  Target Ranges:  Prepandial:   less than 140 mg/dL      Peak postprandial:   less than 180 mg/dL (1-2 hours)      Critically ill patients:  140 - 180 mg/dL   Lab Results  Component Value Date   GLUCAP 230 (H) 08/17/2020   HGBA1C 7.3 (H) 08/10/2020    Review of Glycemic Control Results for Susan Odom, Susan Odom (MRN [id number]) as of 08/17/2020 10:00  Ref. Range 08/16/2020 16:41 08/16/2020 19:25 08/16/2020 23:43 08/17/2020 06:37  Glucose-Capillary Latest Ref Range: 70 - 99 mg/dL 229 (H) 310 (H) 251 (H) 230 (H)   Diabetes history: Type 2 DM Outpatient Diabetes medications: Invokana 100 mg QD Current orders for Inpatient glycemic control: Levemir 10 units QHS, Novolog 3 units TID, Novolog 0-15 units TID, Novolog 0-5 units QHS Decadron 4 mg Q8H Inpatient Diabetes Program Recommendations:    Consider increasing Levemir 14 units QHS.   Thanks, Bronson Curb, MSN, RNC-OB Diabetes Coordinator [phone] (8a-5p)

## 2020-08-17 NOTE — TOC Progression Note (Signed)
Transition of Care [facility]) - Progression Note    Patient Details  Name: Susan Odom MRN: [id number] Date of Birth: [birth year]  Transition of [facility]) CM/SW [place], [place] Phone Number: 08/17/2020, 2:23 PM  Clinical Narrative:    CSW left vm for pt's daughter to contact for possible SNF placement and assessment.  TOC Team will continue to assist with disposition planning.        Expected Discharge Plan and Services                                                 Social Determinants of Health (SDOH) Interventions    Readmission Risk Interventions No flowsheet data found.

## 2020-08-17 NOTE — Plan of Care (Signed)
  Problem: Education: Goal: Knowledge of General Education information will improve Description: Including pain rating scale, medication(s)/side effects and non-pharmacologic comfort measures Outcome: Progressing   Problem: Clinical Measurements: Goal: Will remain free from infection Outcome: Progressing   Problem: Activity: Goal: Risk for activity intolerance will decrease Outcome: Progressing   Problem: Nutrition: Goal: Adequate nutrition will be maintained Outcome: Progressing Patient less alert this morning, by afternoon, patient eyes open and able to move all extremities a little more. Patient able to verbalize name, DOB and that she was in hospital. Patient eating >50% of meal with assist.

## 2020-08-17 NOTE — Progress Notes (Signed)
Initial Nutrition Assessment  DOCUMENTATION CODES:   Obesity unspecified  INTERVENTION:   -Ensure Enlive po BID, each supplement provides 350 kcal and 20 grams of protein -MVI with minerals daily -Magic cup TID with meals, each supplement provides 290 kcal and 9 grams of protein -Feeding assistance with meals  NUTRITION DIAGNOSIS:   Inadequate oral intake related to lethargy/confusion as evidenced by meal completion < 25%.  GOAL:   Patient will meet greater than or equal to 90% of their needs  MONITOR:   PO intake, Supplement acceptance, Diet advancement, Labs, Weight trends, Skin, I & O's  REASON FOR ASSESSMENT:   Low Braden    ASSESSMENT:   Susan Odom is a 74 y.o. female with medical history significant of hypertension, diabetes, obesity presented to ER for evaluation of right knee pain.  Pt admitted with rt femur fracture and brain mass/ frontal brain lesion.   9/18- s/p IM retrograde rt femoral nailing 9/21- s/p lt frontal stereotactic brain biopsy 9/22- s/p BSE- recommending regular diet with thin liquids  Reviewed I/O's: -220 ml x 24 hours and -75 ml since admission  UOP: 850 ml x 24 hours  Spoke with pt at bedside, who was sleepy and had delayed responses to questions. Observed breakfast tray- pt consumed about 1/3 of yogurt and 50% of cranberry juice. Noted meal completion 25-75%. Assisted pt with consuming juices.   Reviewed wt hx; wt has been stable over the past 9 months.   Medications reviewed and include decadron and colace.   Lab Results  Component Value Date   HGBA1C 7.3 (H) 08/10/2020   PTA DM medications are none. Per ADA's Standards of Medical Care for Diabetes, glycemic targets for elderly patients who are otherwise healthy (few medical impairments) and cognitively intact should be less stringent (Hgb A1c <7.5).    Labs reviewed: K: 3.4, CBGS: 230-251 (inpatient orders for glycemic control are 0-15 units insulin aspart TID with meals, 0-5  units insulin aspart daily at bedtime, 3 units insulin aspart TID with meals, and 10 units insulin determir daily at bedtime).   NUTRITION - FOCUSED PHYSICAL EXAM:    Most Recent Value  Orbital Region No depletion  Upper Arm Region No depletion  Thoracic and Lumbar Region No depletion  Buccal Region No depletion  Temple Region Mild depletion  Clavicle Bone Region No depletion  Clavicle and Acromion Bone Region No depletion  Scapular Bone Region No depletion  Dorsal Hand No depletion  Patellar Region No depletion  Anterior Thigh Region No depletion  Posterior Calf Region No depletion  Edema (RD Assessment) Moderate  Hair Reviewed  Eyes Reviewed  Mouth Reviewed  Skin Reviewed  Nails Reviewed       Diet Order:   Diet Order            DIET DYS 3 Room service appropriate? Yes with Assist; Fluid consistency: Thin  Diet effective now                 EDUCATION NEEDS:   Education needs have been addressed  Skin:  Skin Assessment: Skin Integrity Issues: Skin Integrity Issues:: Incisions, Other (Comment), Stage II Stage II: rt buttocks Incisions: rt thigh Other: MASD bilateral buttocks  Last BM:  08/16/20  Height:   Ht Readings from Last 1 Encounters:  08/14/20 5\' 11"  (1.803 m)    Weight:   Wt Readings from Last 1 Encounters:  08/14/20 104.2 kg    Ideal Body Weight:  70.5 kg  BMI:  Body  mass index is 32.04 kg/m.  Estimated Nutritional Needs:   Kcal:  2100-2300  Protein:  120-140 grams  Fluid:  > 2 L    Loistine Chance, RD, LDN, [place] Registered Dietitian II Certified Diabetes Care and Education Specialist Please refer to [facility] for RD and/or RD on-call/weekend/after hours pager

## 2020-08-17 NOTE — Progress Notes (Signed)
Providing Compassionate, Quality Care - Together   Neurosurgery Service Progress Note  Subjective: No acute events overnight. No new complaints this morning  Objective: Vitals:   08/16/20 1921 08/16/20 2349 08/17/20 0434 08/17/20 0756  BP: 138/70 (!) 145/69 (!) 142/68 (!) 151/65  Pulse: 73 70 66 71  Resp: 17 20 18 17   Temp: 97.7 F (36.5 C) 97.7 F (36.5 C) 97.6 F (36.4 C) 98.5 F (36.9 C)  TempSrc: Oral Oral Oral Oral  SpO2: 97% 95% 98% 94%  Weight:      Height:       Temp (24hrs), Avg:98 F (36.7 C), Min:97.6 F (36.4 C), Max:98.5 F (36.9 C)  CBC Latest Ref Rng & Units 08/17/2020 08/15/2020 08/14/2020  WBC 4.0 - 10.5 K/uL 7.0 5.4 6.0  Hemoglobin 12.0 - 15.0 g/dL 11.3(L) 11.3(L) 10.5(L)  Hematocrit 36 - 46 % 34.7(L) 34.1(L) 32.2(L)  Platelets 150 - 400 K/uL 205 184 166   BMP Latest Ref Rng & Units 08/17/2020 08/15/2020 08/14/2020  Glucose 70 - 99 mg/dL 246(H) 214(H) 186(H)  BUN 8 - 23 mg/dL 23 13 9   Creatinine 0.44 - 1.00 mg/dL 0.42(L) 0.60 0.55  Sodium 135 - 145 mmol/L 140 135 134(L)  Potassium 3.5 - 5.1 mmol/L 3.4(L) 4.1 3.5  Chloride 98 - 111 mmol/L 100 99 99  CO2 22 - 32 mmol/L 29 22 26   Calcium 8.9 - 10.3 mg/dL 10.2 9.3 9.2    Intake/Output Summary (Last 24 hours) at 08/17/2020 0933 Last data filed at 08/17/2020 0500 Gross per 24 hour  Intake 370.38 ml  Output 850 ml  Net -479.62 ml    Current Facility-Administered Medications:  .  0.9 %  sodium chloride infusion, , Intravenous, Continuous, Osie Cheeks, NP, Stopped at 08/16/20 1002 .  amLODipine (NORVASC) tablet 10 mg, 10 mg, Oral, Daily, Osie Cheeks, NP, 10 mg at 08/16/20 0850 .  Chlorhexidine Gluconate Cloth 2 % PADS 6 each, 6 each, Topical, Daily, Osie Cheeks, NP, 6 each at 08/16/20 1000 .  [COMPLETED] dexamethasone (DECADRON) injection 6 mg, 6 mg, Intravenous, Q6H, 6 mg at 08/15/20 1147 **FOLLOWED BY** [COMPLETED] dexamethasone (DECADRON) injection 4 mg, 4 mg, Intravenous, Q6H, 4 mg at 08/16/20 1236  **FOLLOWED BY** dexamethasone (DECADRON) injection 4 mg, 4 mg, Intravenous, Q8H, Naima Veldhuizen, Maudie Mercury, NP, 4 mg at 08/17/20 4098 .  docusate sodium (COLACE) capsule 100 mg, 100 mg, Oral, BID, Osie Cheeks, NP, 100 mg at 08/16/20 2341 .  hydrALAZINE (APRESOLINE) injection 10 mg, 10 mg, Intravenous, Q6H PRN, Osie Cheeks, NP .  insulin aspart (novoLOG) injection 0-15 Units, 0-15 Units, Subcutaneous, TID WC, Osie Cheeks, NP, 5 Units at 08/16/20 1747 .  insulin aspart (novoLOG) injection 0-5 Units, 0-5 Units, Subcutaneous, QHS, Osie Cheeks, NP, 3 Units at 08/16/20 2342 .  insulin aspart (novoLOG) injection 3 Units, 3 Units, Subcutaneous, TID WC, Rai, Ripudeep K, MD, 3 Units at 08/16/20 1738 .  insulin detemir (LEVEMIR) injection 10 Units, 10 Units, Subcutaneous, QHS, Rai, Ripudeep K, MD, 10 Units at 08/16/20 2340 .  levETIRAcetam (KEPPRA) IVPB 500 mg/100 mL premix, 500 mg, Intravenous, Q12H, Osie Cheeks, NP, Last Rate: 380 mL/hr at 08/17/20 0628, 500 mg at 08/17/20 0628 .  losartan (COZAAR) tablet 100 mg, 100 mg, Oral, Daily, Osie Cheeks, NP, 100 mg at 08/16/20 0850 .  MEDLINE mouth rinse, 15 mL, Mouth Rinse, BID, Rai, Ripudeep K, MD, 15 mL at 08/16/20 0851 .  menthol-cetylpyridinium (CEPACOL) lozenge 3 mg, 1 lozenge, Oral, PRN **OR** phenol (CHLORASEPTIC) mouth spray 1  spray, 1 spray, Mouth/Throat, PRN, Osie Cheeks, NP .  metoCLOPramide (REGLAN) tablet 5-10 mg, 5-10 mg, Oral, Q8H PRN **OR** metoCLOPramide (REGLAN) injection 5-10 mg, 5-10 mg, Intravenous, Q8H PRN, Osie Cheeks, NP .  ondansetron (ZOFRAN) tablet 4 mg, 4 mg, Oral, Q6H PRN **OR** ondansetron (ZOFRAN) injection 4 mg, 4 mg, Intravenous, Q6H PRN, Osie Cheeks, NP .  oxyCODONE (Oxy IR/ROXICODONE) immediate release tablet 5 mg, 5 mg, Oral, Q4H PRN, Osie Cheeks, NP, 5 mg at 08/17/20 0631   Physical Exam: AOx3, PERRL, EOMI, FS, much more interactive today, move all extremities,  Incision c/d/i    Assessment & Plan: 74 y.o. female with PMH for essential  hypertension,diabets, obesity, presented to ED on  08/10/2020 for right knee pain and gradual onset of confusion. CT head revealed lobular masslike lesion centered predominantly along the corpus callosum, interventricular septum and fornix with surrounding vasogenic edema which crossed the genu of corpus callosum and is also present the left frontal white matter. 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  -awaiting for path -continue decadron 4mg  q8h  -Continue keppra 500mg  BID -4mg  decadron BID upon discharge -appreciate PT/OT recs  Osie Cheeks, NP 08/17/20 9:33 AM

## 2020-08-17 NOTE — Progress Notes (Signed)
[facility]                                                                              Patient Demographics  Susan Odom, is a 74 y.o. female, DOB - 1946/02/14, GYJ:[id number]  Admit date - 08/10/2020   Admitting Physician Meredith Pel, MD  Outpatient Primary MD for the patient is Redmond School, MD  Outpatient specialists:   LOS - 7  days   Medical records reviewed and are as summarized below:    Chief Complaint  Patient presents with  . Leg Pain       Brief summary   Susan Odom is a 74 year old female with past medical history notable for essential hypertension, diabetes, obesity, presented to ED with right knee pain for 2 weeks, progressive over the past 24 hours prior to admission.  Associated with swelling.  Patient reported a fall few days prior.  Additionally, daughter reported gradual onset of confusion and cognitive decline recently. Patient initially went to [facility], ED on 08/07/2020 with back pain, MRI notable for degenerative changes. UA concerning for UTI and was discharged home on Keflex, Percocet and prednisone.  Was seen by neurosurgery with worsening back pain and difficulty walking for 2 weeks.  She was unable to stand or bear weight in the office; and subsequently was sent to the emergency department for further evaluation and management.  In the ED, WBC count 14.0, sodium 134, ESR within normal limits, CRP 5.4, Covid-19 PCR negative.  X-ray right hip notable for severe displaced and comminuted oblique fracture distal right femur.  Chest x-ray with no acute findings.  CT right knee with mildly comminuted and slightly displaced fracture distal femoral metadiaphysis.  CT head with lobular masslike lesion predominantly along the corpus callosum with likely surrounding vasogenic edema concerning for intracranial neoplasm such as lymphoma or glioblastoma multiform a.  Neurosurgery and orthopedics were consulted.    Patient was admitted for  further work-up.  Assessment & Plan    Principal Problem:  Right femur fracture - Patient presented with right lower extremity pain and difficulty ambulating -Patient underwent ORIF with intramedullary retrograde femoral nailing on 9/18 by Dr. 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 --Neurosurgery, neuro oncology and palliative care consulted.  -Underwent left frontal stereotactic brain biopsy on 9/21, frozen section diagnosis high-grade glial tumor -Continue Decadron, Keppra. -Dr. Mickeal Skinner to evaluate patient for further plan and management.  Urinary tract infection -UA positive for UTI, urine culture showed multiple species -Completed 3-day course of IV  Rocephin on 9/20   Hypokalemia K3.4, replaced  Essential hypertension -Continue amlodipine, losartan, hydralazine IV as needed with parameters  Type 2 diabetes mellitus On Invokana outpatient, will hold while inpatient. -CBGs uncontrolled likely due to steroids -Continue sliding scale insulin, increased Levemir to 14 units at bedtime, continue meal coverage   Leukocytosis Patient afebrile with a WBC count of 14.0 on admission.    Etiology likely due to steroids, UTI    -Resolved  ?  Aspiration SLP evaluation recommended dysphagia 3 diet  Obesity Estimated body mass index is 32.04 kg/m as calculated from the following:   Height as of this encounter: _0  (1.803 m).   Weight as of this encounter: 104.2 kg.  Code Status: DNR DVT Prophylaxis:   SCD's Family Communication: Discussed all imaging results, lab results, explained to the patient and daughter on the phone.   Disposition Plan:     Status is: Inpatient  Remains inpatient appropriate because:Inpatient level of care appropriate due to severity of illness   Dispo:  Patient From: Home  Planned Disposition: [place]  Expected discharge date: 08/20/2020  Medically stable for discharge: Pending skilled nursing facility when bed available  Time Spent in minutes 25 minutes  Procedures:  Left frontal stereotactic brain biopsy  Consultants:   Neurosurgery Neuro-oncology Orthopedics Palliative medicine  Antimicrobials:   Anti-infectives (From admission, onward)   Start     Dose/Rate Route Frequency Ordered Stop   08/14/20 1500  ceFAZolin (ANCEF) IVPB 2g/100 mL premix        2 g 200 mL/hr over 30 Minutes Intravenous  Once 08/14/20 1456 08/14/20 1528   08/14/20 1457  ceFAZolin (ANCEF) 2-4 GM/100ML-% IVPB       Note to Pharmacy: Grace Blight   : cabinet override      08/14/20 1457 08/14/20 1547   08/11/20 1600  ceFAZolin (ANCEF) IVPB 2g/100 mL premix        2 g 200 mL/hr over 30 Minutes Intravenous Every 6 hours 08/11/20 1436 08/11/20 2301   08/11/20 1430  cefTRIAXone (ROCEPHIN) 1 g in sodium chloride 0.9 % 100 mL IVPB        1 g 200 mL/hr over 30 Minutes Intravenous Every 24 hours 08/10/20 1758 08/13/20 1413   08/11/20 1300  cefTRIAXone (ROCEPHIN) 1 g in sodium chloride 0.9 % 100 mL IVPB  Status:  Discontinued        1 g 200 mL/hr over 30 Minutes Intravenous  Once 08/11/20 1258 08/11/20 1425   08/11/20 1100  vancomycin (VANCOCIN) powder  Status:  Discontinued           As needed 08/11/20 1100 08/11/20 1232   08/11/20 0910  ceFAZolin (ANCEF) IVPB 2g/100 mL premix        2 g 200 mL/hr over 30 Minutes Intravenous To [facility] 08/10/20 2227 08/11/20 0930   08/10/20 1430  cefTRIAXone (ROCEPHIN) 1 g in sodium chloride 0.9 % 100 mL IVPB        1 g 200 mL/hr over 30 Minutes Intravenous  Once 08/10/20 1417 08/10/20 1501         Medications  Scheduled Meds: . amLODipine  10 mg Oral Daily  . Chlorhexidine Gluconate Cloth  6 each Topical Daily  . dexamethasone (DECADRON) injection  4 mg Intravenous Q8H  . docusate sodium  100 mg Oral BID  . insulin aspart  0-15 Units Subcutaneous TID WC  . insulin aspart  0-5 Units Subcutaneous QHS  . insulin aspart  3  Units Subcutaneous TID WC  . insulin detemir  14 Units Subcutaneous QHS  . losartan  100 mg Oral Daily  . mouth rinse  15 mL Mouth Rinse BID   Continuous Infusions: . sodium chloride Stopped (08/16/20 1002)  . levETIRAcetam 500 mg (08/17/20 0628)   PRN Meds:.hydrALAZINE, menthol-cetylpyridinium **OR** phenol, metoCLOPramide **OR** metoCLOPramide (REGLAN) injection, ondansetron **OR** ondansetron (ZOFRAN) IV, oxyCODONE      Subjective:   Tamela Elsayed was seen and examined today.  Still somewhat groggy at the time of my encounter.  No acute complaints, easily arousable and able to follow commands.  No fevers.  Objective:   Vitals:   08/16/20 1921 08/16/20 2349 08/17/20 0434 08/17/20 0756  BP: 138/70 (!) 145/69 (!) 142/68 (!) 151/65  Pulse: 73 70 66 71  Resp: _0 Temp: 97.7 F (36.5 C) 97.7 F (36.5 C) 97.6 F (36.4 C) 98.5 F (36.9 C)  TempSrc: Oral Oral Oral Oral  SpO2: 97% 95% 98% 94%  Weight:      Height:        Intake/Output Summary (Last 24 hours) at 08/17/2020 1151 Last data filed at 08/17/2020 0500 Gross per 24 hour  Intake 360 ml  Output 650 ml  Net -290 ml     Wt Readings from Last 3 Encounters:  08/14/20 104.2 kg  08/06/20 99.8 kg  07/28/20  102.1 kg   Physical Exam  General: Somewhat lethargic and groggy but arousable and responds appropriately  Cardiovascular: S1 S2 clear, RRR. No pedal edema b/l  Respiratory: Decreased breath sound at the bases  Gastrointestinal: Soft, nontender, nondistended, NBS  Ext: no pedal edema bilaterally  Neuro: no new deficits  Musculoskeletal: No cyanosis, clubbing  Skin: No rashes  Psych: with some cognitive deficits   Data Reviewed:  I have personally reviewed following labs and imaging studies  Micro Results Recent Results (from the past 240 hour(s))  SARS Coronavirus 2 by RT PCR (hospital order, performed in [facility] hospital lab) Nasopharyngeal Nasopharyngeal Swab     Status: None   Collection Time: 08/10/20  2:33 PM   Specimen: Nasopharyngeal Swab  Result Value Ref Range Status   SARS Coronavirus 2 NEGATIVE NEGATIVE Final    Comment: (NOTE) SARS-CoV-2 target nucleic acids are NOT DETECTED.  The SARS-CoV-2 RNA is generally detectable in upper and lower respiratory specimens during the acute phase of infection. The lowest concentration of SARS-CoV-2 viral copies this assay can detect is 250 copies / mL. A negative result does not preclude SARS-CoV-2 infection and should not be used as the sole basis for treatment or other patient management decisions.  A negative result may occur with improper specimen collection / handling, submission of specimen other than nasopharyngeal swab, presence of viral mutation(s) within the areas targeted by this assay, and inadequate number of viral copies (<250 copies / mL). A negative result must be combined with clinical observations, patient history, and epidemiological information.  Fact Sheet for Patients:   StrictlyIdeas.no  Fact Sheet for Healthcare Providers: BankingDealers.co.za  This test is not yet approved or  cleared by the [place] FDA and has been authorized for  detection and/or diagnosis of SARS-CoV-2 by FDA under an Emergency Use Authorization (EUA).  This EUA will remain in effect (meaning this test can be used) for the duration of the COVID-19 declaration under Section 564(b)(1) of the Act, 21 U.S.C. section 360bbb-3(b)(1), unless the authorization is terminated or revoked sooner.  Performed at [facility], [place]  [address], [place], [place] [address]   MRSA PCR Screening     Status: None   Collection Time: 08/11/20  5:28 AM   Specimen: Nasal Mucosa; Nasopharyngeal  Result Value Ref Range Status   MRSA by PCR NEGATIVE NEGATIVE Final    Comment:        The GeneXpert MRSA Assay (FDA approved for NASAL specimens only), is one component of a comprehensive MRSA colonization surveillance program. It is not intended to diagnose MRSA infection nor to guide or monitor treatment for MRSA infections. Performed at [facility], [place] [address] [place], [place] [address]     Radiology Reports DG Chest 1 View  Result Date: 07/28/2020 CLINICAL DATA:  Fall last night. EXAM: CHEST  1 VIEW COMPARISON:  August 22, 2013. FINDINGS: Stable cardiomediastinal silhouette. Mild bibasilar subsegmental atelectasis is noted. No pneumothorax or pleural effusion is noted. Bony thorax is unremarkable. IMPRESSION: Mild bibasilar subsegmental atelectasis. Electronically Signed   By: Marijo Conception M.D.   On: 07/28/2020 12:47   DG Lumbar Spine Complete  Result Date: 07/28/2020 CLINICAL DATA:  Lower back pain after fall last night. EXAM: LUMBAR SPINE - COMPLETE 4+ VIEW COMPARISON:  None. FINDINGS: No fracture or significant spondylolisthesis is noted. Moderate to severe degenerative disc disease is noted at all levels of the lumbar spine. 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 No acute abnormality seen in the lumbar spine. Electronically Signed   By: Marijo Conception M.D.   On: 07/28/2020 12:49   DG Knee 1-2 Views  Right  Result Date: 08/10/2020 CLINICAL DATA:  Right knee pain after multiple falls. EXAM: RIGHT KNEE - 1-2 VIEW COMPARISON:  None. FINDINGS: Severely displaced and comminuted oblique fracture is seen involving the distal right femur. There is slight overriding of the fracture fragments. Degenerative changes are seen involving the right knee. IMPRESSION: Severely displaced and comminuted oblique fracture of distal right femur. Electronically Signed   By: Marijo Conception M.D.   On: 08/10/2020 13:24   DG Ankle Complete Left  Result Date: 08/10/2020 CLINICAL DATA:  Pain EXAM: LEFT ANKLE COMPLETE - 3+ VIEW COMPARISON:  None. FINDINGS: Bilateral soft tissue swelling.  No fracture or dislocation. IMPRESSION: Bilateral soft tissue swelling without fracture dislocation. Electronically Signed   By: Dorise Bullion III M.D   On: 08/10/2020 19:48   DG Ankle Complete Right  Result Date: 08/11/2020 CLINICAL DATA:  Right hip pain. EXAM: RIGHT ANKLE - COMPLETE 3+ VIEW COMPARISON:  None. FINDINGS: Significant lateral soft tissue swelling identified. No displaced fractures are identified. There is a subtle lucency in the distal fibula medially which is favored to represent a prominent nutrient foramen. This lucency does not extend through the shaft of the distal fibula. The first lateral view was limited as the patient had a dressing noted posteriorly partially obscuring the posterior distal fibula. A repeat film was then obtained without the dressing. No definitive fractures are seen on today's study. Degenerative changes are seen in the mid and hindfoot. IMPRESSION: No convincing evidence of fracture. Degenerative changes in the mid and hindfoot. Electronically Signed   By: Dorise Bullion III M.D   On: 08/11/2020 09:50   CT HEAD WO CONTRAST  Result Date: 08/15/2020 CLINICAL DATA:  Intracranial mass EXAM: CT HEAD WITHOUT CONTRAST TECHNIQUE: Contiguous axial images were obtained from the base of the skull through the  vertex without intravenous contrast. COMPARISON:  08/10/2020 FINDINGS: Brain: The nodular mass involving the septum pellucidum and extending into  the genu of the corpus callosum anteriorly as well as the body and isthmus of the corpus callosum posteriorly, asymmetrically more invasive on the left, is again seen. Since the prior examination, there has developed punctate foci of gas as well as acute hemorrhage within the dominant, left colossal and deep white matter portion of the mass in keeping with stereotactic brain biopsy. A tiny burr hole is seen within the left parietal region and a tiny focus of hemorrhage is seen along the needle tract. The lobular hemorrhage within the mass extends into the left lateral ventricle with minimal layering hemorrhage and blood clot within the occipital horn. No significant mass effect noted. Minimal pneumocephalus within the left lateral ventricle related to biopsy. Ventricular size is normal.  Cerebellum unremarkable. Vascular: Unremarkable Skull: Otherwise intact Sinuses/Orbits: Paranasal sinuses are clear. Orbits are unremarkable. Other: Mastoid air cells and middle ear cavities are clear. IMPRESSION: Interval stereotactic biopsy of the left colossal/deep white matter portion of the infiltrative mass with mild lobular hemorrhage in the region of biopsy with slight intraventricular extension. No abnormal mass effect. Normal ventricular size. Electronically Signed   By: Fidela Salisbury MD   On: 08/15/2020 07:23   CT Head Wo Contrast  Result Date: 08/10/2020 CLINICAL DATA:  Mental status change, history of falls EXAM: CT HEAD WITHOUT CONTRAST TECHNIQUE: Contiguous axial images were obtained from the base of the skull through the vertex without intravenous contrast. COMPARISON:  CT 10/17/2013 FINDINGS: Brain: Ill-defined slightly hyperattenuating lobular masslike lesions about the corpus callosum,, interventricular septum and fornix with some hypoattenuating, likely vasogenic  edema crossing genu of the corpus callosum and deep white matter adjacent the left lateral ventricle as well. Overall, incompletely characterized on the CT imaging. No areas of hyperdense hemorrhage. Some local mass effect and partial effacement of the lateral ventricles is noted. No extra-axial collection or hemorrhage is evident. Chronic mild expansion of the retro cerebellar CSF space likely reflecting arachnoid cyst versus mega cisterna magna. Symmetric prominence of the ventricles, cisterns and sulci compatible with parenchymal volume loss. Chronic small vessel ischemic changes are similar to priors. Vascular: Atherosclerotic calcification of the carotid siphons and intradural vertebral arteries. No hyperdense vessel. Skull: No calvarial fracture or suspicious osseous lesion. No scalp swelling or hematoma. Sinuses/Orbits: Paranasal sinuses and mastoid air cells are predominantly clear. Included orbital structures are unremarkable. Other: None IMPRESSION: Lobular masslike lesion centered predominantly along the corpus callosum, interventricular septum and fornix with likely surrounding vasogenic edema which crosses the genu of the corpus callosum and is also present the left frontal white matter. Primary differential considerations would include a intracranial neoplasm such as CNS lymphoma or glioblastoma multiforme. Overall, incompletely characterized on this unenhanced head CT. Recommend further evaluation with MRI with contrast. No other acute intracranial abnormality. Background of some mild chronic parenchymal volume loss and microvascular angiopathy changes with intracranial atherosclerosis. These results were called by telephone at the time of interpretation on 08/10/2020 at 4:01 pm to provider [facility] , who verbally acknowledged these results. Electronically Signed   By: Lovena Le M.D.   On: 08/10/2020 16:01   CT Knee Right Wo Contrast  Result Date: 08/10/2020 CLINICAL DATA:  Frequent  falls.  Distal femur fracture. EXAM: CT OF THE RIGHT KNEE WITHOUT CONTRAST TECHNIQUE: Multidetector CT imaging of the right knee was performed according to the standard protocol. Multiplanar CT image reconstructions were also generated. COMPARISON:  Radiographs same date. FINDINGS: Bones/Joint/Cartilage Oblique fracture of the distal femoral metadiaphysis is again noted with mild  comminution. This fracture is associated with up to 4.3 cm of medial displacement as well as significant overriding of the fracture fragments. There is no intra-articular extension of the fracture. The patella, proximal tibia and proximal fibula are intact. There are advanced tricompartmental degenerative changes at the knee with large osteophytes and intra-articular loose bodies. There is a small joint effusion. Ligaments Suboptimally assessed by CT. The anterior cruciate ligament is not well visualized. Muscles and Tendons The extensor mechanism is intact. Mild generalized muscular atrophy. Soft tissues Small hematoma adjacent to the fracture. No foreign body, soft tissue emphysema or bone destruction. IMPRESSION: 1. Mildly comminuted and significantly displaced fracture of the distal femoral metadiaphysis as described. No intra-articular extension of the fracture. 2. 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 Electronically Signed   By: Richardean Sale M.D.   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 Electronically Signed   By: Kellie Simmering DO   On: 08/13/2020 22:04   Result Date: 08/13/2020 CLINICAL DATA:  Provided history: Brain mass or lesion; brain lab protocol for  surgery 921. EXAM: MRI HEAD WITH CONTRAST TECHNIQUE: Multiplanar, multiecho pulse sequences of the brain and surrounding structures were obtained with intravenous contrast. CONTRAST:  36m GADAVIST GADOBUTROL 1 MMOL/ML IV SOLN COMPARISON:  Brain MRI with and without contrast 08/10/2020, head CT 08/10/2020. FINDINGS: Brain: Unchanged appearance of lobulated and infiltrative masslike T2/FLAIR hyperintense signal abnormality expanding the body of the corpus callosum with nodular extension along the septum pellucidum and contiguous spread along the margins of the frontal horns of both lateral ventricles. As before, the lesion crosses midline via the body of the corpus callosum with centrum semiovale involvement (greater on the left) and frontal horn involvement (greater on the right). Also unchanged, there is corresponding heterogeneous contrast enhancement throughout the affected areas. Redemonstrated small areas of discontinuous petechial enhancement, most notably within the callosal splenium. Unchanged suspicious abnormal leptomeningeal enhancement with both internal auditory canals and questionable abnormal leptomeningeal enhancement along the ventral brainstem. Background mild scattered T2/FLAIR hyperintensity within the cerebral white matter and brainstem is nonspecific, but most commonly seen on the basis of chronic small vessel ischemia. Vascular: Expected enhancement within the proximal large arterial vessels and dural venous sinuses. Skull and upper cervical spine: No suspicious focal marrow lesion is identified on the acquired sequences. Sinuses/Orbits: No appreciable acute orbital abnormality on the acquired sequences. Mild paranasal sinus mucosal thickening, most notably ethmoidal. Trace fluid within left mastoid air cells. IMPRESSION: Central callosal, septal and periventricular infiltrative an enhancing tumor most suspicious for glioblastoma/high-grade glioma, unchanged as compared to 08/10/2020. Lymphoma  can have a similar imaging appearance, but is considered less likely. 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 Electronically Signed: By: KKellie SimmeringDO On: 08/13/2020 21:56   MR BRAIN W WO CONTRAST  Result Date: 08/10/2020 CLINICAL DATA:  74year old female with mental status changes, falls. Abnormal noncontrast head today suggesting a pericallosal mass. EXAM: MRI HEAD WITHOUT AND WITH CONTRAST TECHNIQUE: Multiplanar, multiecho pulse sequences of the brain and surrounding structures were obtained without and with intravenous contrast. CONTRAST:  143mGADAVIST GADOBUTROL 1 MMOL/ML IV SOLN  COMPARISON:  Head CT earlier today. Prior head CTs 10/17/2013 and earlier. FINDINGS: Brain: Lobulated, infiltrative masslike T2 and FLAIR hyperintense lesion expanding the body of the corpus callosum, with nodular extension along the septum pellucidum, and contiguous spread in and around the frontal horns of both lateral ventricles. The lesion crosses midline via the body of the corpus callosum, with left predominant centrum semiovale but right predominant frontal horn regional involvement. Much of the lesion shows mildly to moderately restricted diffusion but relative T2 hyperintensity. There is petechial hemorrhage in the central colo cell and pericallosal white matter portion on SWI (series 12, image 35). Following contrast there is heterogeneous enhancement throughout the affected areas. And there are occasional small discontinuous areas of petechial enhancement (most notably in the splenium of the corpus callosum on series 20, image 13. All told, the lesion encompasses roughly 69 x 51 x 36 mm (AP by transverse by CC). Despite the fairly extensive periventricular configuration, no disseminated ependymal enhancement is identified. However, there is suspicious abnormal leptomeningeal enhancement in both internal  auditory canals right greater than left (series 18, image 14). And there is also questionable abnormally increased leptomeningeal enhancement along the ventral brainstem (series 20, image 13). No other abnormal meningeal enhancement. No ventriculomegaly or transependymal edema. No significant intracranial mass effect. Basilar cisterns remain patent. No superimposed restricted diffusion suggestive of acute infarction. No extra-axial collection or acute intracranial hemorrhage. Cervicomedullary junction and pituitary are within normal limits. Vascular: Major intracranial vascular flow voids are preserved, with generalized intracranial artery tortuosity. The major dural venous sinuses are enhancing and appear to be patent. Skull and upper cervical spine: Negative visible cervical spine and spinal cord. Normal bone marrow signal. Sinuses/Orbits: Negative. Other: Scalp and face appear negative. IMPRESSION: 1. 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 CNS lymphoma felt less likely. Area of involvement encompasses 69 x 51 x 36 mm. 2. 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 3. No significant intracranial mass effect. 4. Recommend Neuro-Oncology consultation. Electronically Signed   By: Genevie Ann M.D.   On: 08/10/2020 18:03   MR LUMBAR SPINE WO CONTRAST  Result Date: 08/07/2020 CLINICAL DATA:  Low back pain and bilateral leg pain over the last few days. EXAM: MRI LUMBAR SPINE WITHOUT CONTRAST TECHNIQUE: Multiplanar, multisequence MR imaging of the lumbar spine was performed. No intravenous contrast was administered. COMPARISON:  CT 07/28/2020 and 07/13/2020. FINDINGS: Segmentation:  5 lumbar type vertebral bodies. Alignment: Mild thoracolumbar curvature convex to the left and  lower lumbar curvature convex to the right. No antero or retrolisthesis. Vertebrae: Old healed minor superior endplate compression deformity at L1 with maximal loss of height 40%. No retropulsed bone. No evidence of recent fracture in the region from inferior T11 to superior S3. Conus medullaris and cauda equina: Conus extends to the L2 level. Conus and cauda equina appear normal. Paraspinal and other soft tissues: No significant finding. Disc levels: Minimal non-compressive disc bulges at T11-12, T12-L1 and L1-2. L2-3: Moderate disc bulge. Slight indentation of the thecal sac but no apparent compressive stenosis. L3-4: Endplate osteophytes and moderate disc bulge more prominent towards the left. Mild narrowing of the lateral recesses but no visible compressive stenosis. L4-5: Endplate osteophytes and bulging of the disc. Mild narrowing of the lateral recesses but no compressive stenosis. L5-S1: Endplate osteophytes and bulging of the disc. No canal stenosis.  Bilateral foraminal narrowing right more than left that would have some potential to affect either or both L5 nerves, more likely the right. IMPRESSION: 1. Old healed minor superior endplate compression deformity at L1 with maximal loss of height of 40%. No retropulsed bone. No evidence of recent fracture. No evidence of sacral insufficiency fracture as question on the previous CT. 2. 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 No compressive central canal stenosis. Bilateral foraminal narrowing at L5-S1 right worse than left that would have some potential to affect either or both L5 nerves, more likely the right. Electronically Signed   By: Nelson Chimes M.D.   On: 08/07/2020 09:24   CT ABDOMEN PELVIS W CONTRAST  Result Date: 07/28/2020 CLINICAL DATA:  Low back and right hip pain since falling from bed last night. EXAM: CT ABDOMEN AND PELVIS WITH CONTRAST TECHNIQUE: Multidetector CT imaging of the abdomen and pelvis was  performed using the standard protocol following bolus administration of intravenous contrast. CONTRAST:  14m OMNIPAQUE IOHEXOL 300 MG/ML  SOLN COMPARISON:  Lumbar spine and right hip radiographs same date. CT lumbar spine 07/13/2020. FINDINGS: Lower chest: Atherosclerosis of the aorta and coronary arteries. Possible calcifications of the aortic valve. The heart size is normal. There is no significant pleural or pericardial effusion. Mild atelectasis is present at both lung bases. Hepatobiliary: The liver is normal in density without suspicious focal abnormality. No evidence of gallstones, gallbladder wall thickening or biliary dilatation. Pancreas: Unremarkable. No pancreatic ductal dilatation or surrounding inflammatory changes. Spleen: Small central calcifications, likely granulomas. No splenomegaly or acute abnormality. Adrenals/Urinary Tract: Both adrenal glands appear normal. No evidence of acute kidney injury, urinary tract calculus or hydronephrosis. 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 Dependent high density in the right aspect of the bladder may reflect a small bladder calculus. No definite contrast excretion into the bladder. Stomach/Bowel: No evidence of bowel wall thickening, distention or surrounding inflammatory change. Vascular/Lymphatic: There are no enlarged abdominal or pelvic lymph nodes. No evidence of retroperitoneal hematoma. There is diffuse aortic and branch vessel atherosclerosis. As above, stable right renal artery aneurysm. Reproductive: Probable fiducial clips in the lower uterine segment. The uterus and ovaries otherwise appear normal. Other: Intact anterior abdominal wall. No ascites, hemoperitoneum or pneumoperitoneum. Musculoskeletal: No acute or significant osseous findings. There is a stable chronic superior endplate compression deformity at L1.  There is stable multilevel lumbar spondylosis. Stable sclerosis in the sacral ala bilaterally. IMPRESSION: 1. No acute findings or explanation for the patient's symptoms. 2. 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 3. Possible small bladder calculus. 4. Aortic Atherosclerosis (ICD10-I70.0). CT ADDITIONAL VIEWS (CT OF THE RIGHT HIP) Small field-of-view reformatted images through the right hip were obtained. There is no evidence of acute fracture or dislocation. There are mild right hip degenerative changes. There are stable degenerative changes at the right sacroiliac joint and bilateral sacral ala sclerosis. No significant soft tissue abnormalities in the proximal right thigh. Electronically Signed   By: WRichardean SaleM.D.   On: 07/28/2020 14:20   DG Chest Portable 1 View  Result Date: 08/10/2020 CLINICAL DATA:  Altered mental status EXAM: PORTABLE CHEST 1 VIEW COMPARISON:  Radiograph 07/28/2020 FINDINGS: Chronically coarsened interstitial changes in the lungs are not significantly changed from comparison studies. These are slightly more pronounced in the left lung than right. No new focal consolidative opacity is  seen. No visible pneumothorax or effusion though portion of the left costophrenic sulcus is collimated from view. Cardiac size is within normal limits for portable technique. The aorta is calcified. The remaining cardiomediastinal contours are unremarkable. No acute osseous or soft tissue abnormality. Degenerative changes are present in the imaged spine and shoulders. Telemetry leads and nasal cannula overlie the chest. IMPRESSION: Chronically coarsened interstitial changes in the lungs. No convincing new focal consolidative opacity. Electronically Signed   By: Lovena Le M.D.   On: 08/10/2020 15:08   DG C-Arm 1-60 Min  Result Date: 08/11/2020 CLINICAL DATA:  Intramedullary nail placement. EXAM: RIGHT FEMUR 2 VIEWS; DG C-ARM 1-60 MIN COMPARISON:  08/10/2020 FINDINGS: Placement  of right intramedullary femoral nail with proximal and distal anchoring screws. Hardware is intact as there is anatomic alignment over patient's distal femoral diametaphyseal fracture. Remainder the exam is unchanged. IMPRESSION: Internal fixation of distal femoral diametaphyseal fracture in anatomic alignment. Hardware intact and in adequate position. Electronically Signed   By: Marin Olp M.D.   On: 08/11/2020 13:45   CT NO CHARGE  Result Date: 07/28/2020 CLINICAL DATA:  Low back and right hip pain since falling from bed last night. EXAM: CT ABDOMEN AND PELVIS WITH CONTRAST TECHNIQUE: Multidetector CT imaging of the abdomen and pelvis was performed using the standard protocol following bolus administration of intravenous contrast. CONTRAST:  151m OMNIPAQUE IOHEXOL 300 MG/ML  SOLN COMPARISON:  Lumbar spine and right hip radiographs same date. CT lumbar spine 07/13/2020. FINDINGS: Lower chest: Atherosclerosis of the aorta and coronary arteries. Possible calcifications of the aortic valve. The heart size is normal. There is no significant pleural or pericardial effusion. Mild atelectasis is present at both lung bases. Hepatobiliary: The liver is normal in density without suspicious focal abnormality. No evidence of gallstones, gallbladder wall thickening or biliary dilatation. Pancreas: Unremarkable. No pancreatic ductal dilatation or surrounding inflammatory changes. Spleen: Small central calcifications, likely granulomas. No splenomegaly or acute abnormality. Adrenals/Urinary Tract: Both adrenal glands appear normal. No evidence of acute kidney injury, urinary tract calculus or hydronephrosis. 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 Dependent high density in the right aspect of the bladder may reflect a small bladder calculus. No definite contrast excretion into the bladder.  Stomach/Bowel: No evidence of bowel wall thickening, distention or surrounding inflammatory change. Vascular/Lymphatic: There are no enlarged abdominal or pelvic lymph nodes. No evidence of retroperitoneal hematoma. There is diffuse aortic and branch vessel atherosclerosis. As above, stable right renal artery aneurysm. Reproductive: Probable fiducial clips in the lower uterine segment. The uterus and ovaries otherwise appear normal. Other: Intact anterior abdominal wall. No ascites, hemoperitoneum or pneumoperitoneum. Musculoskeletal: No acute or significant osseous findings. There is a stable chronic superior endplate compression deformity at L1. There is stable multilevel lumbar spondylosis. Stable sclerosis in the sacral ala bilaterally. IMPRESSION: 1. No acute findings or explanation for the patient's symptoms. 2. 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 3. Possible small bladder calculus. 4. Aortic Atherosclerosis (ICD10-I70.0). CT ADDITIONAL VIEWS (CT OF THE RIGHT HIP) Small field-of-view reformatted images through the right hip were obtained. There is no evidence of acute fracture or dislocation. There are mild right hip degenerative changes. There are stable degenerative changes at the right sacroiliac joint and bilateral sacral ala sclerosis. No significant soft tissue abnormalities in the proximal right thigh. Electronically Signed   By: WCaryl ComesD.  On: 07/28/2020 14:20   DG HIP UNILAT WITH PELVIS 2-3 VIEWS RIGHT  Result Date: 08/10/2020 CLINICAL DATA:  Right hip pain. EXAM: DG HIP (WITH OR WITHOUT PELVIS) 2-3V RIGHT COMPARISON:  None. FINDINGS: There is no evidence of hip fracture or dislocation. There is no evidence of arthropathy or other focal bone abnormality. IMPRESSION: Negative. Electronically Signed   By: Dorise Bullion III M.D   On: 08/10/2020 19:42   DG HIP UNILAT WITH PELVIS 2-3 VIEWS RIGHT  Result Date: 08/07/2020 CLINICAL DATA:  Status post fall with  subsequent right hip pain. EXAM: DG HIP (WITH OR WITHOUT PELVIS) 2-3V RIGHT COMPARISON:  July 28, 2020 FINDINGS: There is no evidence of an acute hip fracture or dislocation. The mild to moderate severity degenerative changes seen involving both hips. Radiopaque surgical clips are seen overlying the lower pelvis. IMPRESSION: No acute osseous abnormality. Electronically Signed   By: Virgina Norfolk M.D.   On: 08/07/2020 00:58   DG Hip Unilat W or Wo Pelvis 2-3 Views Right  Result Date: 07/28/2020 CLINICAL DATA:  Right hip pain after fall. EXAM: DG HIP (WITH OR WITHOUT PELVIS) 2-3V RIGHT COMPARISON:  None. FINDINGS: There is no evidence of hip fracture or dislocation. There is no evidence of arthropathy or other focal bone abnormality. IMPRESSION: Negative. Electronically Signed   By: Marijo Conception M.D.   On: 07/28/2020 12:46   DG FEMUR 1V RIGHT  Result Date: 08/11/2020 CLINICAL DATA:  Fixation of right femoral fracture. EXAM: RIGHT FEMUR 1 VIEW COMPARISON:  08/10/2020 FINDINGS: Examination demonstrates evidence of patient's right femoral intramedullary nail ridging known distal femoral diametaphyseal fracture. There are proximal and distal anchoring screws. Hardware is intact with anatomic alignment over the fracture site. Exam is otherwise unchanged. IMPRESSION: Fixation of right femoral diametaphyseal fracture with hardware intact and anatomic alignment over the fracture site. Electronically Signed   By: Marin Olp M.D.   On: 08/11/2020 13:48   DG FEMUR, MIN 2 VIEWS RIGHT  Result Date: 08/11/2020 CLINICAL DATA:  Intramedullary nail placement. EXAM: RIGHT FEMUR 2 VIEWS; DG C-ARM 1-60 MIN COMPARISON:  08/10/2020 FINDINGS: Placement of right intramedullary femoral nail with proximal and distal anchoring screws. Hardware is intact as there is anatomic alignment over patient's distal femoral diametaphyseal fracture. Remainder the exam is unchanged. IMPRESSION: Internal fixation of distal femoral  diametaphyseal fracture in anatomic alignment. Hardware intact and in adequate position. Electronically Signed   By: Marin Olp M.D.   On: 08/11/2020 13:45    Lab Data:  CBC: Recent Labs  Lab 08/10/20 1405 08/10/20 1405 08/12/20 0648 08/13/20 0150 08/14/20 0446 08/15/20 1001 08/17/20 0359  WBC 14.0*   < > 8.8 7.4 6.0 5.4 7.0  NEUTROABS 11.7*  --   --   --   --   --   --   HGB 13.7   < > 10.8* 10.2* 10.5* 11.3* 11.3*  HCT 42.6   < > 32.9* 31.6* 32.2* 34.1* 34.7*  MCV 92.6   < > 94.3 92.4 92.8 93.4 93.3  PLT 218   < > 161 152 166 184 205   < > = values in this interval not displayed.   Basic Metabolic Panel: Recent Labs  Lab 08/10/20 1405 08/10/20 1616 08/12/20 0648 08/13/20 0150 08/14/20 0446 08/15/20 1001 08/17/20 0359  NA   < >  --  138 138 134* 135 140  K   < >  --  4.1 3.4* 3.5 4.1 3.4*  CL   < >  --  106 103 99 99 100  CO2   < >  --  20* _0 GLUCOSE   < >  --  182* 188* 186* 214* 246*  BUN   < >  --  _1 CREATININE   < >  --  0.64 0.48 0.55 0.60 0.42*  CALCIUM   < >  --  9.3 9.3 9.2 9.3 10.2  MG  --  2.0  --   --  1.7 2.1  --    < > = values in this interval not displayed.   GFR: Estimated Creatinine Clearance: 82 mL/min (A) (by C-G formula based on SCr of 0.42 mg/dL (L)). Liver Function Tests: Recent Labs  Lab 08/10/20 1405  AST 16  ALT 16  ALKPHOS 75  BILITOT 1.4*  PROT 6.8  ALBUMIN 3.3*   No results for input(s): LIPASE, AMYLASE in the last 168 hours. No results for input(s): AMMONIA in the last 168 hours. Coagulation Profile: Recent Labs  Lab 08/13/20 0150 08/14/20 0446 08/15/20 1001 08/16/20 1647 08/17/20 0359  INR 1.2 1.1 1.2 1.2 1.1   Cardiac Enzymes: No results for input(s): CKTOTAL, CKMB, CKMBINDEX, TROPONINI in the last 168 hours. BNP (last 3 results) No results for input(s): PROBNP in the last 8760 hours. HbA1C: No results for input(s): HGBA1C in the last 72 hours. CBG: Recent Labs  Lab 08/16/20 1145  08/16/20 1641 08/16/20 1925 08/16/20 2343 08/17/20 0637  GLUCAP 289* 229* 310* 251* 230*   Lipid Profile: No results for input(s): CHOL, HDL, LDLCALC, TRIG, CHOLHDL, LDLDIRECT in the last 72 hours. Thyroid Function Tests: No results for input(s): TSH, T4TOTAL, FREET4, T3FREE, THYROIDAB in the last 72 hours. Anemia Panel: No results for input(s): VITAMINB12, FOLATE, FERRITIN, TIBC, IRON, RETICCTPCT in the last 72 hours. Urine analysis:    Component Value Date/Time   COLORURINE AMBER (A) 08/10/2020 1730   APPEARANCEUR CLOUDY (A) 08/10/2020 1730   LABSPEC 1.028 08/10/2020 1730   PHURINE 5.0 08/10/2020 1730   GLUCOSEU >=500 (A) 08/10/2020 1730   HGBUR LARGE (A) 08/10/2020 1730   BILIRUBINUR NEGATIVE 08/10/2020 1730   KETONESUR 20 (A) 08/10/2020 1730   PROTEINUR NEGATIVE 08/10/2020 1730   UROBILINOGEN 0.2 08/23/2013 1404   NITRITE NEGATIVE 08/10/2020 1730   LEUKOCYTESUR LARGE (A) 08/10/2020 1730     Kennia Vanvorst M.D. [facility] 08/17/2020, 11:51 AM   Call night coverage person covering after 7pm

## 2020-08-17 NOTE — Plan of Care (Signed)
  Problem: Clinical Measurements: Goal: Will remain free from infection Outcome: Progressing   Problem: Coping: Goal: Level of anxiety will decrease Outcome: Progressing   Problem: Elimination: Goal: Will not experience complications related to bowel motility Outcome: Progressing   

## 2020-08-17 NOTE — Care Management Important Message (Signed)
Important Message  Patient Details  Name: Susan Odom MRN: [id number] Date of Birth: 03/28/1946   Medicare Important Message Given:  Yes - Important Message mailed due to current National Emergency  Verbal consent obtained due to current National Emergency  Relationship to patient: Self Contact Name: Alexarae Oliva Call Date: 08/17/20  Time: 1158 Phone: [phone] Outcome: No Answer/Busy Important Message mailed to: Patient address on file    Delorse Lek 08/17/2020, 11:58 AM

## 2020-08-17 NOTE — Progress Notes (Signed)
   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 Please assist with GOC/MDM and family support"  HPI:  Per intake H&P --> Susan Odom is a 74 y.o. female with medical history significant of hypertension, diabetes, obesity who presented to the emergency department for difficulties with back pain and inability to walk for 2 weeks time. Over the last 2 weeks she has had 2 visits to the emergency department at [facility]. In regards the right knee she has a distal femur fracture which she received surgery for on 9/18. 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  Today's Discussion (08/17/2020): Chart reviewed. Biopsy done early in the week pathology is still pending.   Patient more somnolent this afternoon - noted to have ROM device on RLE  Spoke to patients daughter Susan Odom she expresses that based upon pathology she will determine next steps. She shares that she did speak to [facility] last week.   Discussed the importance of continued conversation with family and their  medical providers regarding overall plan of care and treatment options, ensuring decisions are within the context of the patients values and GOCs.  Questions and concerns addressed   Subjective Assessment: Vital Signs Vitals:   08/17/20 0434 08/17/20 0756  BP: (!) 142/68 (!) 151/65  Pulse: 66 71  Resp: 18 17  Temp: 97.6 F (36.4 C) 98.5 F (36.9 C)  SpO2: 98% 94%    Intake/Output Summary (Last 24 hours) at 08/17/2020 1229 Last data filed at 08/17/2020 1200 Gross per 24 hour  Intake 480 ml  Output 1100 ml  Net -620 ml   Last Weight  Most recent update: 08/14/2020  6:29 AM   Weight  104.2 kg (229 lb 11.5 oz)            Gen:  Elderly F in NAD HEENT: Dry mucous membranes CV: Irregular rate and  Regular rhythm, no murmurs rubs or gallops PULM: ON 1LPm [place] clear to auscultation bilaterally. No wheezes/rales/rhonchi  ABD: soft/nontender/nondistended/normal bowel sounds  EXT: RLE w/ bandages at knesssensation in tact Neuro: Somnolent  SUMMARY OF RECOMMENDATIONS DNAR/DNI  E-MOST Completed, electric copy can be found in [facility]  Family interested to see what tumor biopsy reveals if glioblastoma would wish to pursue hospice, family would prefer [facility] if it comes to that  Ongoing PMT support  Time Spent: 25 Greater than 50% of the time was spent in counseling and coordination of care ______________________________________________________________________________________ [place] Team Team Cell Phone: [phone] Please utilize secure chat with additional questions, if there is no response within 30 minutes please call the above phone number  Palliative Medicine Team providers are available by phone from 7am to 7pm daily and can be reached through the team cell phone.  Should this patient require assistance outside of these hours, please call the patient's attending physician.

## 2020-08-17 NOTE — Progress Notes (Signed)
Physical Therapy Treatment Patient Details Name: Susan Odom MRN: [id number] DOB: Mar 27, 1946 Today's Date: 08/17/2020    History of Present Illness Susan Odom is a 74 y.o. female with medical history significant of hypertension, diabetes, obesity presented to ER for evaluation of right knee pain ongoing for 2 weeks leading up to this admission; she went to [facility] ED on 9/14 with back pain.  Had MRI done-which shows degenerative changes.  UA was concerning for UTI therefore she discharged home on Keflex, Percocet and prednisone and recommended outpatient neurosurgery follow-up.  Was seen by neurosurgery  morning of 9/17, due to worsening back pain and difficulty walking since 2 weeks.  At the office: She was not able to stand or bear weight, was extremely in pain,  not able to obtain radiographs therefore she was sent to ER for further evaluation and management.  Daughter reports gradual onset of confusion & cognitive decline recently.  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    PT Comments    Pt supine on arrival, agreeable to therapy session with encouragement and with fair participation and tolerance for session. Pt presents with significant anxiety and very tearful during session, pt perseverating on wanting to return home and demos delayed processing, confusion and impaired sequencing. With encouragement, pt performed supine BUE/BLE therapeutic exercises as detailed below with fair tolerance, mostly AAROM (up to PROM due to confusion) and needing frequent reorientation to task. Pt vitals stable on 1L O2 . Pt continues to benefit from PT services to progress toward functional mobility goals. Continue to recommend SNF level of rehab.   Follow Up Recommendations  SNF;Other (comment)      Equipment Recommendations  Wheelchair (measurements PT);Wheelchair cushion (measurements PT);Hospital bed;Other (comment) ((if pt instead chooses to return home))    Recommendations for Other Services       Precautions / Restrictions Precautions Precautions: Fall Precaution Comments: has CPM Required Braces or Orthoses: Knee Immobilizer - Right Knee Immobilizer - Right: On when out of bed or walking Restrictions Weight Bearing Restrictions: Yes RLE Weight Bearing: Touchdown weight bearing    Mobility  Bed Mobility Overal bed mobility: Needs Assistance             General bed mobility comments: deferred EOB/OOB 2/2 emotional lability and lack of +2 assist, pt with inconsistent command following and decreased initiation for supine repositioning, +2total A for posterior supine scoot with bed in trendelenburg    Wheelchair Mobility    Modified Rankin (Stroke Patients Only)          Cognition Arousal/Alertness: Awake/alert Behavior During Therapy: Flat affect;Anxious (labile) Overall Cognitive Status: No family/caregiver present to determine baseline cognitive functioning Area of Impairment: Orientation;Following commands;Problem solving;Attention;Awareness                 Orientation Level: Disoriented to;Place;Situation;Time     Following Commands: Follows one step commands inconsistently     Problem Solving: Slow processing;Difficulty sequencing;Decreased initiation;Requires verbal cues;Requires tactile cues General Comments: pt perseverating on "wanting to go home", oriented to DOB and "hospital" but not otherwise answering orientation questions and tearful on/off during session      Exercises General Exercises - Upper Extremity Shoulder Flexion: AAROM;15 reps;Both;Supine (HOB elevated ~40 deg) Elbow Flexion: AAROM;15 reps;Supine;Both Wrist Flexion: AAROM;10 reps;Supine;Both Digit Composite Flexion:  AROM;10 reps;Left;Supine General Exercises -  Lower Extremity Ankle Circles/Pumps: Supine;15 reps;Both;AAROM Heel Slides: AAROM;Both;10 reps;Supine (R knee limited to ~30-35 deg ROM) Hip ABduction/ADduction: AAROM;10 reps;Supine;Both    General Comments General comments (skin integrity, edema, etc.): defer 2/2 lack of +2 assist/ pt confusion and poor command following during supine activity      Pertinent Vitals/Pain Pain Assessment: Faces Faces Pain Scale: Hurts whole lot Pain Location: seems to increase with RLE ROM, pt unable to verbalize location of pain Pain Descriptors / Indicators: Grimacing;Guarding Pain Intervention(s): Limited activity within patient's tolerance;Monitored during session;Repositioned;Other (comment) (RN informed of faces pain score and pt tearful, RN to check for pain meds available)           PT Goals (current goals can now be found in the care plan section) Acute Rehab PT Goals Patient Stated Goal: Did not state PT Goal Formulation: Patient unable to participate in goal setting Time For Goal Achievement: 08/26/20 Potential to Achieve Goals: Fair Progress towards PT goals: Progressing toward goals    Frequency    Min 2X/week      PT Plan Current plan remains appropriate       AM-PAC PT "6 Clicks" Mobility   Outcome Measure  Help needed turning from your back to your side while in a flat bed without using bedrails?: Total Help needed moving from lying on your back to sitting on the side of a flat bed without using bedrails?: Total Help needed moving to and from a bed to a chair (including a wheelchair)?: Total Help needed standing up from a chair using your arms (e.g., wheelchair or bedside chair)?: Total Help needed to walk in hospital room?: Total Help needed climbing 3-5 steps with a railing? : Total 6 Click Score: 6    End of Session   Activity Tolerance: Patient limited by fatigue;Patient limited by lethargy;Other (comment) (emotional lability) Patient left: in bed;with bed  alarm set;with call bell/phone within reach Nurse Communication: Mobility status PT Visit Diagnosis: Other abnormalities of gait and mobility (R26.89);Pain Pain - Right/Left: Right Pain - part of body: Leg     Time: 6468-0321 PT Time Calculation (min) (ACUTE ONLY): 25 min  Charges:  $Therapeutic Exercise: 8-22 mins $Therapeutic Activity: 8-22 mins                     Kyree Adriano P., PTA Acute Rehabilitation Services Pager: [phone] Office: [place] 08/17/2020, 2:48 PM

## 2020-08-17 NOTE — Consult Note (Signed)
[facility] Consult Note  Patient Care Team: Redmond School, MD as PCP - General (Internal Medicine)  CHIEF COMPLAINTS/PURPOSE OF CONSULTATION:  Brain Mass  HISTORY OF PRESENTING ILLNESS:  Susan Odom 74 y.o. female presented to medical attention with several weeks of progressive gait impairment and severe right leg pain.  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  The leg underwent ORIF on 9/19, stereotactic brain biopsy was performed on 9/21 by Dr. Ellene Route.  She tolerated surgery well without significant complication, although cognition has been somewhat cloudy.  Daughter provided most of history at bedside today.  MEDICAL HISTORY:  Past Medical History:  Diagnosis Date  . Diabetes mellitus   . Hypertension     SURGICAL HISTORY: Past Surgical History:  Procedure Laterality Date  . APPLICATION OF CRANIAL NAVIGATION N/A 08/14/2020   Procedure: APPLICATION OF CRANIAL NAVIGATION;  Surgeon: Kristeen Miss, MD;  Location: [facility];  Service: Neurosurgery;  Laterality: N/A;  . BREAST SURGERY    . FEMUR IM NAIL Right 08/11/2020   Procedure: INTRAMEDULLARY (IM) RETROGRADE FEMORAL NAILING, with biomet cables;  Surgeon: Meredith Pel, MD;  Location: [facility];  Service: Orthopedics;  Laterality: Right;  . FRAMELESS  BIOPSY WITH BRAINLAB Left 08/14/2020   Procedure: Left frontal stereotactic brain biopsy;  Surgeon: Kristeen Miss, MD;  Location: [facility];  Service: Neurosurgery;  Laterality: Left;    SOCIAL HISTORY: Social History   Socioeconomic History  . Marital status: Married    Spouse name: Not on file  . Number of children: Not on file  . Years of education: Not on file  . Highest education level: Not on file  Occupational History  . Not on file  Tobacco Use  . Smoking status: Never Smoker  . Smokeless tobacco: Never Used  Substance and Sexual Activity  . Alcohol  use: No  . Drug use: No  . Sexual activity: Not on file  Other Topics Concern  . Not on file  Social History Narrative  . Not on file   Social Determinants of Health   Financial Resource Strain:   . Difficulty of Paying Living Expenses: Not on file  Food Insecurity:   . Worried About Charity fundraiser in the Last Year: Not on file  . Ran Out of Food in the Last Year: Not on file  Transportation Needs:   . Lack of Transportation (Medical): Not on file  . Lack of Transportation (Non-Medical): Not on file  Physical Activity:   . Days of Exercise per Week: Not on file  . Minutes of Exercise per Session: Not on file  Stress:   . Feeling of Stress : Not on file  Social Connections:   . Frequency of Communication with Friends and Family: Not on file  . Frequency of Social Gatherings with Friends and Family: Not on file  . Attends Religious Services: Not on file  . Active Member of Clubs or Organizations: Not on file  . Attends Archivist Meetings: Not on file  . Marital Status: Not on file  Intimate Partner Violence:   . Fear of Current or Ex-Partner: Not on file  . Emotionally Abused: Not on file  . Physically Abused: Not on file  . Sexually Abused: Not on file    FAMILY HISTORY: History reviewed. No pertinent family history.  ALLERGIES:  has No Known Allergies.  MEDICATIONS:  Current Facility-Administered Medications  Medication Dose Route Frequency Provider Last Rate Last Admin  . 0.9 %  sodium chloride infusion   Intravenous Continuous Osie Cheeks, NP   Stopped at 08/16/20 1002  . amLODipine (NORVASC) tablet 10 mg  10 mg Oral Daily Osie Cheeks, NP   10 mg at 08/17/20 1021  . Chlorhexidine Gluconate Cloth 2 % PADS 6 each  6 each Topical Daily Osie Cheeks, NP   6 each at 08/16/20 1000  . dexamethasone (DECADRON) injection 4 mg  4 mg Intravenous Q8H Osie Cheeks, NP   4 mg at 08/17/20 1405  . docusate sodium (COLACE) capsule 100 mg  100 mg Oral BID Osie Cheeks, NP    100 mg at 08/17/20 1021  . feeding supplement (ENSURE ENLIVE) (ENSURE ENLIVE) liquid 237 mL  237 mL Oral BID BM Rai, Ripudeep K, MD      . hydrALAZINE (APRESOLINE) injection 10 mg  10 mg Intravenous Q6H PRN Osie Cheeks, NP      . insulin aspart (novoLOG) injection 0-15 Units  0-15 Units Subcutaneous TID WC Osie Cheeks, NP   8 Units at 08/17/20 1405  . insulin aspart (novoLOG) injection 0-5 Units  0-5 Units Subcutaneous QHS Osie Cheeks, NP   3 Units at 08/16/20 2342  . insulin aspart (novoLOG) injection 3 Units  3 Units Subcutaneous TID WC Rai, Ripudeep K, MD   3 Units at 08/17/20 1404  . insulin detemir (LEVEMIR) injection 14 Units  14 Units Subcutaneous QHS Rai, Ripudeep K, MD      . levETIRAcetam (KEPPRA) IVPB 500 mg/100 mL premix  500 mg Intravenous Q12H Osie Cheeks, NP 380 mL/hr at 08/17/20 0628 500 mg at 08/17/20 0628  . losartan (COZAAR) tablet 100 mg  100 mg Oral Daily Osie Cheeks, NP   100 mg at 08/17/20 1021  . MEDLINE mouth rinse  15 mL Mouth Rinse BID Rai, Ripudeep K, MD   15 mL at 08/17/20 1021  . menthol-cetylpyridinium (CEPACOL) lozenge 3 mg  1 lozenge Oral PRN Osie Cheeks, NP       Or  . phenol (CHLORASEPTIC) mouth spray 1 spray  1 spray Mouth/Throat PRN Osie Cheeks, NP      . metoCLOPramide (REGLAN) tablet 5-10 mg  5-10 mg Oral Q8H PRN Osie Cheeks, NP       Or  . metoCLOPramide (REGLAN) injection 5-10 mg  5-10 mg Intravenous Q8H PRN Osie Cheeks, NP      . multivitamin with minerals tablet 1 tablet  1 tablet Oral Daily Rai, Ripudeep K, MD      . ondansetron (ZOFRAN) tablet 4 mg  4 mg Oral Q6H PRN Osie Cheeks, NP       Or  . ondansetron (ZOFRAN) injection 4 mg  4 mg Intravenous Q6H PRN Osie Cheeks, NP      . oxyCODONE (Oxy IR/ROXICODONE) immediate release tablet 5 mg  5 mg Oral Q4H PRN Osie Cheeks, NP   5 mg at 08/17/20 0631  . potassium chloride SA (KLOR-CON) CR tablet 40 mEq  40 mEq Oral Once Rai, Ripudeep K, MD        REVIEW OF SYSTEMS:   Constitutional: Denies fevers, chills or  abnormal weight loss Eyes: Denies blurriness of vision Ears, nose, mouth, throat, and face: Denies mucositis or sore throat Respiratory: Denies cough, dyspnea or wheezes Cardiovascular: Denies palpitation, chest discomfort or lower extremity swelling Gastrointestinal:  Denies nausea, constipation, diarrhea GU: Denies dysuria or incontinence Skin: Denies abnormal skin rashes Neurological: Per HPI Musculoskeletal: Denies joint  pain, back or neck discomfort. No decrease in ROM Behavioral/Psych: Denies anxiety, disturbance in thought content, and mood instability   PHYSICAL EXAMINATION: Vitals:   08/17/20 1433 08/17/20 1507  BP:  140/74  Pulse: 91 90  Resp:  17  Temp:  98.2 F (36.8 C)  SpO2: 95% 98%   KPS: 50. General: Awake, somewhat alert, morbid obesity Head: normal EENT: No conjunctival injection or scleral icterus. Oral mucosa moist Lungs: Resp effort normal Cardiac: Regular rate and rhythm Abdomen: Soft, non-distended abdomen Skin: No rashes cyanosis or petechiae. Extremities: R leg brace  NEUROLOGIC EXAM: Mental Status: Awake, drowsy, inconsistent alertness to examiner. Oriented to self and environment. Language is fluent with intact basic comprehension.  Cranial Nerves: Visual acuity is grossly normal. Visual fields are full. Extra-ocular movements intact. No ptosis. Face is symmetric, tongue midline. Motor: Tone and bulk are normal. Power is full in both arms and legs. Reflexes are symmetric, no pathologic reflexes present. Intact finger to nose bilaterally Sensory: grossly intact Gait: Deferred  LABORATORY DATA:  I have reviewed the data as listed Lab Results  Component Value Date   WBC 7.0 08/17/2020   HGB 11.3 (L) 08/17/2020   HCT 34.7 (L) 08/17/2020   MCV 93.3 08/17/2020   PLT 205 08/17/2020   Recent Labs    07/28/20 1243 07/28/20 1243 08/07/20 0729 08/07/20 0729 08/10/20 1405 08/12/20 0648 08/14/20 0446 08/15/20 1001 08/17/20 0359  NA 143   < >  135   < > 134*   < > 134* 135 140  K 3.9   < > 4.3   < > 4.4   < > 3.5 4.1 3.4*  CL 106   < > 101   < > 97*   < > 99 99 100  CO2 22   < > 23   < > 24   < > 26 22 29   GLUCOSE 202*   < > 274*   < > 270*   < > 186* 214* 246*  BUN 12   < > 30*   < > 26*   < > 9 13 23   CREATININE 0.61   < > 0.75   < > 0.78   < > 0.55 0.60 0.42*  CALCIUM 9.9   < > 9.5   < > 10.0   < > 9.2 9.3 10.2  GFRNONAA >60   < > >60   < > >60   < > >60 >60 >60  GFRAA >60   < > >60   < > >60   < > >60 >60 >60  PROT 7.6  --  6.6  --  6.8  --   --   --   --   ALBUMIN 4.2  --  3.4*  --  3.3*  --   --   --   --   AST 42*  --  13*  --  16  --   --   --   --   ALT 21  --  17  --  16  --   --   --   --   ALKPHOS 66  --  61  --  75  --   --   --   --   BILITOT 2.2*  --  1.5*  --  1.4*  --   --   --   --    < > = values in this interval not displayed.    RADIOGRAPHIC STUDIES: I have  personally reviewed the radiological images as listed and agreed with the findings in the report. DG Chest 1 View  Result Date: 07/28/2020 CLINICAL DATA:  Fall last night. EXAM: CHEST  1 VIEW COMPARISON:  August 22, 2013. FINDINGS: Stable cardiomediastinal silhouette. Mild bibasilar subsegmental atelectasis is noted. No pneumothorax or pleural effusion is noted. Bony thorax is unremarkable. IMPRESSION: Mild bibasilar subsegmental atelectasis. Electronically Signed   By: Marijo Conception M.D.   On: 07/28/2020 12:47   DG Lumbar Spine Complete  Result Date: 07/28/2020 CLINICAL DATA:  Lower back pain after fall last night. EXAM: LUMBAR SPINE - COMPLETE 4+ VIEW COMPARISON:  None. FINDINGS: No fracture or significant spondylolisthesis is noted. Moderate to severe degenerative disc disease is noted at all levels of the lumbar spine. 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 No acute abnormality seen in the lumbar spine. Electronically Signed   By: Marijo Conception M.D.   On: 07/28/2020 12:49   DG Knee 1-2 Views Right  Result Date:  08/10/2020 CLINICAL DATA:  Right knee pain after multiple falls. EXAM: RIGHT KNEE - 1-2 VIEW COMPARISON:  None. FINDINGS: Severely displaced and comminuted oblique fracture is seen involving the distal right femur. There is slight overriding of the fracture fragments. Degenerative changes are seen involving the right knee. IMPRESSION: Severely displaced and comminuted oblique fracture of distal right femur. Electronically Signed   By: Marijo Conception M.D.   On: 08/10/2020 13:24   DG Ankle Complete Left  Result Date: 08/10/2020 CLINICAL DATA:  Pain EXAM: LEFT ANKLE COMPLETE - 3+ VIEW COMPARISON:  None. FINDINGS: Bilateral soft tissue swelling.  No fracture or dislocation. IMPRESSION: Bilateral soft tissue swelling without fracture dislocation. Electronically Signed   By: Dorise Bullion III M.D   On: 08/10/2020 19:48   DG Ankle Complete Right  Result Date: 08/11/2020 CLINICAL DATA:  Right hip pain. EXAM: RIGHT ANKLE - COMPLETE 3+ VIEW COMPARISON:  None. FINDINGS: Significant lateral soft tissue swelling identified. No displaced fractures are identified. There is a subtle lucency in the distal fibula medially which is favored to represent a prominent nutrient foramen. This lucency does not extend through the shaft of the distal fibula. The first lateral view was limited as the patient had a dressing noted posteriorly partially obscuring the posterior distal fibula. A repeat film was then obtained without the dressing. No definitive fractures are seen on today's study. Degenerative changes are seen in the mid and hindfoot. IMPRESSION: No convincing evidence of fracture. Degenerative changes in the mid and hindfoot. Electronically Signed   By: Dorise Bullion III M.D   On: 08/11/2020 09:50   CT HEAD WO CONTRAST  Result Date: 08/15/2020 CLINICAL DATA:  Intracranial mass EXAM: CT HEAD WITHOUT CONTRAST TECHNIQUE: Contiguous axial images were obtained from the base of the skull through the vertex without  intravenous contrast. COMPARISON:  08/10/2020 FINDINGS: Brain: The nodular mass involving the septum pellucidum and extending into the genu of the corpus callosum anteriorly as well as the body and isthmus of the corpus callosum posteriorly, asymmetrically more invasive on the left, is again seen. Since the prior examination, there has developed punctate foci of gas as well as acute hemorrhage within the dominant, left colossal and deep white matter portion of the mass in keeping with stereotactic brain biopsy. A tiny burr hole is seen within the left parietal region and a tiny focus of hemorrhage is seen along the needle tract. The lobular hemorrhage within the mass extends into the left  lateral ventricle with minimal layering hemorrhage and blood clot within the occipital horn. No significant mass effect noted. Minimal pneumocephalus within the left lateral ventricle related to biopsy. Ventricular size is normal.  Cerebellum unremarkable. Vascular: Unremarkable Skull: Otherwise intact Sinuses/Orbits: Paranasal sinuses are clear. Orbits are unremarkable. Other: Mastoid air cells and middle ear cavities are clear. IMPRESSION: Interval stereotactic biopsy of the left colossal/deep white matter portion of the infiltrative mass with mild lobular hemorrhage in the region of biopsy with slight intraventricular extension. No abnormal mass effect. Normal ventricular size. Electronically Signed   By: Fidela Salisbury MD   On: 08/15/2020 07:23   CT Head Wo Contrast  Result Date: 08/10/2020 CLINICAL DATA:  Mental status change, history of falls EXAM: CT HEAD WITHOUT CONTRAST TECHNIQUE: Contiguous axial images were obtained from the base of the skull through the vertex without intravenous contrast. COMPARISON:  CT 10/17/2013 FINDINGS: Brain: Ill-defined slightly hyperattenuating lobular masslike lesions about the corpus callosum,, interventricular septum and fornix with some hypoattenuating, likely vasogenic edema crossing  genu of the corpus callosum and deep white matter adjacent the left lateral ventricle as well. Overall, incompletely characterized on the CT imaging. No areas of hyperdense hemorrhage. Some local mass effect and partial effacement of the lateral ventricles is noted. No extra-axial collection or hemorrhage is evident. Chronic mild expansion of the retro cerebellar CSF space likely reflecting arachnoid cyst versus mega cisterna magna. Symmetric prominence of the ventricles, cisterns and sulci compatible with parenchymal volume loss. Chronic small vessel ischemic changes are similar to priors. Vascular: Atherosclerotic calcification of the carotid siphons and intradural vertebral arteries. No hyperdense vessel. Skull: No calvarial fracture or suspicious osseous lesion. No scalp swelling or hematoma. Sinuses/Orbits: Paranasal sinuses and mastoid air cells are predominantly clear. Included orbital structures are unremarkable. Other: None IMPRESSION: Lobular masslike lesion centered predominantly along the corpus callosum, interventricular septum and fornix with likely surrounding vasogenic edema which crosses the genu of the corpus callosum and is also present the left frontal white matter. Primary differential considerations would include a intracranial neoplasm such as CNS lymphoma or glioblastoma multiforme. Overall, incompletely characterized on this unenhanced head CT. Recommend further evaluation with MRI with contrast. No other acute intracranial abnormality. Background of some mild chronic parenchymal volume loss and microvascular angiopathy changes with intracranial atherosclerosis. These results were called by telephone at the time of interpretation on 08/10/2020 at 4:01 pm to provider [facility] , who verbally acknowledged these results. Electronically Signed   By: Lovena Le M.D.   On: 08/10/2020 16:01   CT Knee Right Wo Contrast  Result Date: 08/10/2020 CLINICAL DATA:  Frequent falls.  Distal femur  fracture. EXAM: CT OF THE RIGHT KNEE WITHOUT CONTRAST TECHNIQUE: Multidetector CT imaging of the right knee was performed according to the standard protocol. Multiplanar CT image reconstructions were also generated. COMPARISON:  Radiographs same date. FINDINGS: Bones/Joint/Cartilage Oblique fracture of the distal femoral metadiaphysis is again noted with mild comminution. This fracture is associated with up to 4.3 cm of medial displacement as well as significant overriding of the fracture fragments. There is no intra-articular extension of the fracture. The patella, proximal tibia and proximal fibula are intact. There are advanced tricompartmental degenerative changes at the knee with large osteophytes and intra-articular loose bodies. There is a small joint effusion. Ligaments Suboptimally assessed by CT. The anterior cruciate ligament is not well visualized. Muscles and Tendons The extensor mechanism is intact. Mild generalized muscular atrophy. Soft tissues Small hematoma adjacent to the fracture. No foreign  body, soft tissue emphysema or bone destruction. IMPRESSION: 1. Mildly comminuted and significantly displaced fracture of the distal femoral metadiaphysis as described. No intra-articular extension of the fracture. 2. 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 Electronically Signed   By: Richardean Sale M.D.   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 Electronically Signed   By: Kellie Simmering DO   On: 08/13/2020 22:04   Result Date: 08/13/2020 CLINICAL DATA:  Provided history: Brain mass or lesion; brain lab protocol for surgery 921. EXAM:  MRI HEAD WITH CONTRAST TECHNIQUE: Multiplanar, multiecho pulse sequences of the brain and surrounding structures were obtained with intravenous contrast. CONTRAST:  5mL GADAVIST GADOBUTROL 1 MMOL/ML IV SOLN COMPARISON:  Brain MRI with and without contrast 08/10/2020, head CT 08/10/2020. FINDINGS: Brain: Unchanged appearance of lobulated and infiltrative masslike T2/FLAIR hyperintense signal abnormality expanding the body of the corpus callosum with nodular extension along the septum pellucidum and contiguous spread along the margins of the frontal horns of both lateral ventricles. As before, the lesion crosses midline via the body of the corpus callosum with centrum semiovale involvement (greater on the left) and frontal horn involvement (greater on the right). Also unchanged, there is corresponding heterogeneous contrast enhancement throughout the affected areas. Redemonstrated small areas of discontinuous petechial enhancement, most notably within the callosal splenium. Unchanged suspicious abnormal leptomeningeal enhancement with both internal auditory canals and questionable abnormal leptomeningeal enhancement along the ventral brainstem. Background mild scattered T2/FLAIR hyperintensity within the cerebral white matter and brainstem is nonspecific, but most commonly seen on the basis of chronic small vessel ischemia. Vascular: Expected enhancement within the proximal large arterial vessels and dural venous sinuses. Skull and upper cervical spine: No suspicious focal marrow lesion is identified on the acquired sequences. Sinuses/Orbits: No appreciable acute orbital abnormality on the acquired sequences. Mild paranasal sinus mucosal thickening, most notably ethmoidal. Trace fluid within left mastoid air cells. IMPRESSION: Central callosal, septal and periventricular infiltrative an enhancing tumor most suspicious for glioblastoma/high-grade glioma, unchanged as compared to 08/10/2020. Lymphoma can have a similar  imaging appearance, but is considered less likely. 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 Electronically Signed: By: Kellie Simmering DO On: 08/13/2020 21:56   MR BRAIN W WO CONTRAST  Result Date: 08/10/2020 CLINICAL DATA:  74 year old female with mental status changes, falls. Abnormal noncontrast head today suggesting a pericallosal mass. EXAM: MRI HEAD WITHOUT AND WITH CONTRAST TECHNIQUE: Multiplanar, multiecho pulse sequences of the brain and surrounding structures were obtained without and with intravenous contrast. CONTRAST:  39mL GADAVIST GADOBUTROL 1 MMOL/ML IV SOLN COMPARISON:  Head CT earlier today. Prior head CTs 10/17/2013 and earlier. FINDINGS: Brain: Lobulated, infiltrative masslike T2 and FLAIR hyperintense lesion expanding the body of the corpus callosum, with nodular extension along the septum pellucidum, and contiguous spread in and around the frontal horns of both lateral ventricles. The lesion crosses midline via the body of the corpus callosum, with left predominant centrum semiovale but right predominant frontal horn regional involvement. Much of the lesion shows mildly to moderately restricted diffusion but relative T2 hyperintensity. There is petechial hemorrhage in the central colo cell and  pericallosal white matter portion on SWI (series 12, image 35). Following contrast there is heterogeneous enhancement throughout the affected areas. And there are occasional small discontinuous areas of petechial enhancement (most notably in the splenium of the corpus callosum on series 20, image 13. All told, the lesion encompasses roughly 69 x 51 x 36 mm (AP by transverse by CC). Despite the fairly extensive periventricular configuration, no disseminated ependymal enhancement is identified. However, there is suspicious abnormal leptomeningeal enhancement in both internal auditory canals right  greater than left (series 18, image 14). And there is also questionable abnormally increased leptomeningeal enhancement along the ventral brainstem (series 20, image 13). No other abnormal meningeal enhancement. No ventriculomegaly or transependymal edema. No significant intracranial mass effect. Basilar cisterns remain patent. No superimposed restricted diffusion suggestive of acute infarction. No extra-axial collection or acute intracranial hemorrhage. Cervicomedullary junction and pituitary are within normal limits. Vascular: Major intracranial vascular flow voids are preserved, with generalized intracranial artery tortuosity. The major dural venous sinuses are enhancing and appear to be patent. Skull and upper cervical spine: Negative visible cervical spine and spinal cord. Normal bone marrow signal. Sinuses/Orbits: Negative. Other: Scalp and face appear negative. IMPRESSION: 1. 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 CNS lymphoma felt less likely. Area of involvement encompasses 69 x 51 x 36 mm. 2. 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 3. No significant intracranial mass effect. 4. Recommend Neuro-Oncology consultation. Electronically Signed   By: Genevie Ann M.D.   On: 08/10/2020 18:03   MR LUMBAR SPINE WO CONTRAST  Result Date: 08/07/2020 CLINICAL DATA:  Low back pain and bilateral leg pain over the last few days. EXAM: MRI LUMBAR SPINE WITHOUT CONTRAST TECHNIQUE: Multiplanar, multisequence MR imaging of the lumbar spine was performed. No intravenous contrast was administered. COMPARISON:  CT 07/28/2020 and 07/13/2020. FINDINGS: Segmentation:  5 lumbar type vertebral bodies. Alignment: Mild thoracolumbar curvature convex to the left and lower lumbar curvature  convex to the right. No antero or retrolisthesis. Vertebrae: Old healed minor superior endplate compression deformity at L1 with maximal loss of height 40%. No retropulsed bone. No evidence of recent fracture in the region from inferior T11 to superior S3. Conus medullaris and cauda equina: Conus extends to the L2 level. Conus and cauda equina appear normal. Paraspinal and other soft tissues: No significant finding. Disc levels: Minimal non-compressive disc bulges at T11-12, T12-L1 and L1-2. L2-3: Moderate disc bulge. Slight indentation of the thecal sac but no apparent compressive stenosis. L3-4: Endplate osteophytes and moderate disc bulge more prominent towards the left. Mild narrowing of the lateral recesses but no visible compressive stenosis. L4-5: Endplate osteophytes and bulging of the disc. Mild narrowing of the lateral recesses but no compressive stenosis. L5-S1: Endplate osteophytes and bulging of the disc. No canal stenosis. Bilateral foraminal narrowing right more than left that would have some potential to affect either or both L5 nerves, more likely the right. IMPRESSION: 1. Old healed minor superior endplate compression deformity at L1 with maximal loss of height of 40%. No retropulsed bone. No evidence of recent fracture. No evidence of sacral insufficiency fracture as question on the previous CT. 2. 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 No compressive central canal stenosis. Bilateral foraminal narrowing at L5-S1 right worse than left that would have some potential to affect either or both  L5 nerves, more likely the right. Electronically Signed   By: Nelson Chimes M.D.   On: 08/07/2020 09:24   CT ABDOMEN PELVIS W CONTRAST  Result Date: 07/28/2020 CLINICAL DATA:  Low back and right hip pain since falling from bed last night. EXAM: CT ABDOMEN AND PELVIS WITH CONTRAST TECHNIQUE: Multidetector CT imaging of the abdomen and pelvis was performed using the standard  protocol following bolus administration of intravenous contrast. CONTRAST:  195mL OMNIPAQUE IOHEXOL 300 MG/ML  SOLN COMPARISON:  Lumbar spine and right hip radiographs same date. CT lumbar spine 07/13/2020. FINDINGS: Lower chest: Atherosclerosis of the aorta and coronary arteries. Possible calcifications of the aortic valve. The heart size is normal. There is no significant pleural or pericardial effusion. Mild atelectasis is present at both lung bases. Hepatobiliary: The liver is normal in density without suspicious focal abnormality. No evidence of gallstones, gallbladder wall thickening or biliary dilatation. Pancreas: Unremarkable. No pancreatic ductal dilatation or surrounding inflammatory changes. Spleen: Small central calcifications, likely granulomas. No splenomegaly or acute abnormality. Adrenals/Urinary Tract: Both adrenal glands appear normal. No evidence of acute kidney injury, urinary tract calculus or hydronephrosis. 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 Dependent high density in the right aspect of the bladder may reflect a small bladder calculus. No definite contrast excretion into the bladder. Stomach/Bowel: No evidence of bowel wall thickening, distention or surrounding inflammatory change. Vascular/Lymphatic: There are no enlarged abdominal or pelvic lymph nodes. No evidence of retroperitoneal hematoma. There is diffuse aortic and branch vessel atherosclerosis. As above, stable right renal artery aneurysm. Reproductive: Probable fiducial clips in the lower uterine segment. The uterus and ovaries otherwise appear normal. Other: Intact anterior abdominal wall. No ascites, hemoperitoneum or pneumoperitoneum. Musculoskeletal: No acute or significant osseous findings. There is a stable chronic superior endplate compression deformity at L1. There is stable multilevel  lumbar spondylosis. Stable sclerosis in the sacral ala bilaterally. IMPRESSION: 1. No acute findings or explanation for the patient's symptoms. 2. 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 3. Possible small bladder calculus. 4. Aortic Atherosclerosis (ICD10-I70.0). CT ADDITIONAL VIEWS (CT OF THE RIGHT HIP) Small field-of-view reformatted images through the right hip were obtained. There is no evidence of acute fracture or dislocation. There are mild right hip degenerative changes. There are stable degenerative changes at the right sacroiliac joint and bilateral sacral ala sclerosis. No significant soft tissue abnormalities in the proximal right thigh. Electronically Signed   By: Richardean Sale M.D.   On: 07/28/2020 14:20   DG Chest Portable 1 View  Result Date: 08/10/2020 CLINICAL DATA:  Altered mental status EXAM: PORTABLE CHEST 1 VIEW COMPARISON:  Radiograph 07/28/2020 FINDINGS: Chronically coarsened interstitial changes in the lungs are not significantly changed from comparison studies. These are slightly more pronounced in the left lung than right. No new focal consolidative opacity is seen. No visible pneumothorax or effusion though portion of the left costophrenic sulcus is collimated from view. Cardiac size is within normal limits for portable technique. The aorta is calcified. The remaining cardiomediastinal contours are unremarkable. No acute osseous or soft tissue abnormality. Degenerative changes are present in the imaged spine and shoulders. Telemetry leads and nasal cannula overlie the chest. IMPRESSION: Chronically coarsened interstitial changes in the lungs. No convincing new focal consolidative opacity. Electronically Signed   By: Lovena Le M.D.   On: 08/10/2020 15:08   DG C-Arm 1-60 Min  Result Date: 08/11/2020 CLINICAL DATA:  Intramedullary nail placement. EXAM: RIGHT FEMUR 2 VIEWS; DG C-ARM 1-60 MIN COMPARISON:  08/10/2020 FINDINGS: Placement of right intramedullary  femoral nail with proximal and distal anchoring screws. Hardware is intact as there is anatomic alignment over patient's distal femoral diametaphyseal fracture. Remainder the exam is unchanged. IMPRESSION: Internal fixation of distal femoral diametaphyseal fracture in anatomic alignment. Hardware intact and in adequate position. Electronically Signed   By: Marin Olp M.D.   On: 08/11/2020 13:45   CT NO CHARGE  Result Date: 07/28/2020 CLINICAL DATA:  Low back and right hip pain since falling from bed last night. EXAM: CT ABDOMEN AND PELVIS WITH CONTRAST TECHNIQUE: Multidetector CT imaging of the abdomen and pelvis was performed using the standard protocol following bolus administration of intravenous contrast. CONTRAST:  144mL OMNIPAQUE IOHEXOL 300 MG/ML  SOLN COMPARISON:  Lumbar spine and right hip radiographs same date. CT lumbar spine 07/13/2020. FINDINGS: Lower chest: Atherosclerosis of the aorta and coronary arteries. Possible calcifications of the aortic valve. The heart size is normal. There is no significant pleural or pericardial effusion. Mild atelectasis is present at both lung bases. Hepatobiliary: The liver is normal in density without suspicious focal abnormality. No evidence of gallstones, gallbladder wall thickening or biliary dilatation. Pancreas: Unremarkable. No pancreatic ductal dilatation or surrounding inflammatory changes. Spleen: Small central calcifications, likely granulomas. No splenomegaly or acute abnormality. Adrenals/Urinary Tract: Both adrenal glands appear normal. No evidence of acute kidney injury, urinary tract calculus or hydronephrosis. 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 Dependent high density in the right aspect of the bladder may reflect a small bladder calculus. No definite contrast excretion into the bladder. Stomach/Bowel: No evidence  of bowel wall thickening, distention or surrounding inflammatory change. Vascular/Lymphatic: There are no enlarged abdominal or pelvic lymph nodes. No evidence of retroperitoneal hematoma. There is diffuse aortic and branch vessel atherosclerosis. As above, stable right renal artery aneurysm. Reproductive: Probable fiducial clips in the lower uterine segment. The uterus and ovaries otherwise appear normal. Other: Intact anterior abdominal wall. No ascites, hemoperitoneum or pneumoperitoneum. Musculoskeletal: No acute or significant osseous findings. There is a stable chronic superior endplate compression deformity at L1. There is stable multilevel lumbar spondylosis. Stable sclerosis in the sacral ala bilaterally. IMPRESSION: 1. No acute findings or explanation for the patient's symptoms. 2. 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 3. Possible small bladder calculus. 4. Aortic Atherosclerosis (ICD10-I70.0). CT ADDITIONAL VIEWS (CT OF THE RIGHT HIP) Small field-of-view reformatted images through the right hip were obtained. There is no evidence of acute fracture or dislocation. There are mild right hip degenerative changes. There are stable degenerative changes at the right sacroiliac joint and bilateral sacral ala sclerosis. No significant soft tissue abnormalities in the proximal right thigh. Electronically Signed   By: Richardean Sale M.D.   On: 07/28/2020 14:20   DG HIP UNILAT WITH PELVIS 2-3 VIEWS RIGHT  Result Date: 08/10/2020 CLINICAL DATA:  Right hip pain. EXAM: DG HIP (WITH OR WITHOUT PELVIS) 2-3V RIGHT COMPARISON:  None. FINDINGS: There is no evidence of hip fracture or dislocation. There is no evidence of arthropathy or other focal bone abnormality. IMPRESSION: Negative. Electronically Signed   By: Dorise Bullion III M.D   On: 08/10/2020 19:42   DG HIP UNILAT WITH PELVIS 2-3 VIEWS RIGHT  Result Date: 08/07/2020 CLINICAL DATA:  Status post fall with subsequent  right hip pain.  EXAM: DG HIP (WITH OR WITHOUT PELVIS) 2-3V RIGHT COMPARISON:  July 28, 2020 FINDINGS: There is no evidence of an acute hip fracture or dislocation. The mild to moderate severity degenerative changes seen involving both hips. Radiopaque surgical clips are seen overlying the lower pelvis. IMPRESSION: No acute osseous abnormality. Electronically Signed   By: Virgina Norfolk M.D.   On: 08/07/2020 00:58   DG Hip Unilat W or Wo Pelvis 2-3 Views Right  Result Date: 07/28/2020 CLINICAL DATA:  Right hip pain after fall. EXAM: DG HIP (WITH OR WITHOUT PELVIS) 2-3V RIGHT COMPARISON:  None. FINDINGS: There is no evidence of hip fracture or dislocation. There is no evidence of arthropathy or other focal bone abnormality. IMPRESSION: Negative. Electronically Signed   By: Marijo Conception M.D.   On: 07/28/2020 12:46   DG FEMUR 1V RIGHT  Result Date: 08/11/2020 CLINICAL DATA:  Fixation of right femoral fracture. EXAM: RIGHT FEMUR 1 VIEW COMPARISON:  08/10/2020 FINDINGS: Examination demonstrates evidence of patient's right femoral intramedullary nail ridging known distal femoral diametaphyseal fracture. There are proximal and distal anchoring screws. Hardware is intact with anatomic alignment over the fracture site. Exam is otherwise unchanged. IMPRESSION: Fixation of right femoral diametaphyseal fracture with hardware intact and anatomic alignment over the fracture site. Electronically Signed   By: Marin Olp M.D.   On: 08/11/2020 13:48   DG FEMUR, MIN 2 VIEWS RIGHT  Result Date: 08/11/2020 CLINICAL DATA:  Intramedullary nail placement. EXAM: RIGHT FEMUR 2 VIEWS; DG C-ARM 1-60 MIN COMPARISON:  08/10/2020 FINDINGS: Placement of right intramedullary femoral nail with proximal and distal anchoring screws. Hardware is intact as there is anatomic alignment over patient's distal femoral diametaphyseal fracture. Remainder the exam is unchanged. IMPRESSION: Internal fixation of distal femoral diametaphyseal fracture in  anatomic alignment. Hardware intact and in adequate position. Electronically Signed   By: Marin Olp M.D.   On: 08/11/2020 13:45    ASSESSMENT & PLAN:  Glioblastoma  Susan Odom is clinically improving following both orthopedic surgery and brain biopsy.    Today we briefly reviewed pathology results which are consistent with glioblastoma.  No genetic markers are back at this time.  She and her daughter understand this is not a curable condition, and expected survival even with treatment would be less than 1 year given her age and comorbidity.  We could consider abbreviated course of radiation to extend expected lifespan somewhat, possibly with chemotherapy depending on MGMT status.  Other possible treatment pathway would involve supportive care through hospice.   We recommended continuing to improve strength and recovery each day while inpatient, and we could have a more extensive discussion with family present in the brain tumor clinic once discharged.  Would recommend a decadron taper upon discharge (unclear dispo at this time), no more than 3 days of 4mg  BID, followed by 4mg  daily.  We can continue taper from the outpatient setting.  We will continue to follow along her hospital course and arrange follow ups as needed.  All questions were answered. The patient knows to call the clinic with any problems, questions or concerns.  The total time spent in the encounter was 55 minutes and more than 50% was on counseling and review of test results     Ventura Sellers, MD 08/17/2020 5:11 PM

## 2020-08-18 LAB — BASIC METABOLIC PANEL
Anion gap: 7 (ref 5–15)
BUN: 18 mg/dL (ref 8–23)
CO2: 30 mmol/L (ref 22–32)
Calcium: 9.8 mg/dL (ref 8.9–10.3)
Chloride: 100 mmol/L (ref 98–111)
Creatinine, Ser: 0.37 mg/dL — ABNORMAL LOW (ref 0.44–1.00)
GFR calc Af Amer: >60 mL/min (ref >60)
GFR calc non Af Amer: >60 mL/min (ref >60)
Glucose, Bld: 228 mg/dL — ABNORMAL HIGH (ref 70–99)
Potassium: 3.8 mmol/L (ref 3.5–5.1)
Sodium: 137 mmol/L (ref 135–145)

## 2020-08-18 LAB — PROTIME-INR
INR: 1.1 (ref 0.8–1.2)
Prothrombin Time: 13.9 seconds (ref 11.4–15.2)

## 2020-08-18 LAB — CBC
HCT: 35.8 % — ABNORMAL LOW (ref 36.0–46.0)
Hemoglobin: 11.4 g/dL — ABNORMAL LOW (ref 12.0–15.0)
MCH: 29.5 pg (ref 26.0–34.0)
MCHC: 31.8 g/dL (ref 30.0–36.0)
MCV: 92.5 fL (ref 80.0–100.0)
Platelets: 185 10*3/uL (ref 150–400)
RBC: 3.87 MIL/uL (ref 3.87–5.11)
RDW: 14.6 % (ref 11.5–15.5)
WBC: 7.2 10*3/uL (ref 4.0–10.5)
nRBC: 0 % (ref 0.0–0.2)

## 2020-08-18 LAB — GLUCOSE, CAPILLARY
Glucose-Capillary: 212 mg/dL — ABNORMAL HIGH (ref 70–99)
Glucose-Capillary: 350 mg/dL — ABNORMAL HIGH (ref 70–99)
Glucose-Capillary: 237 mg/dL — ABNORMAL HIGH (ref 70–99)
Glucose-Capillary: 184 mg/dL — ABNORMAL HIGH (ref 70–99)

## 2020-08-18 MED ORDER — INSULIN ASPART 100 UNIT/ML ~~LOC~~ SOLN
4.0000 [IU] | Freq: Three times a day (TID) | SUBCUTANEOUS | Status: DC
  Administered 2020-08-18 – 2020-08-19 (×3): 4 [IU] via SUBCUTANEOUS

## 2020-08-18 MED ORDER — INSULIN DETEMIR 100 UNIT/ML ~~LOC~~ SOLN
20.0000 [IU] | Freq: Every day | SUBCUTANEOUS | Status: DC
  Administered 2020-08-18: 20 [IU] via SUBCUTANEOUS
  Filled 2020-08-18 (×3): qty 0.2

## 2020-08-18 MED ORDER — LEVETIRACETAM 500 MG PO TABS
500.0000 mg | ORAL_TABLET | Freq: Two times a day (BID) | ORAL | Status: DC
  Administered 2020-08-18 – 2020-08-21 (×6): 500 mg via ORAL
  Filled 2020-08-18 (×6): qty 1

## 2020-08-18 NOTE — Social Work (Signed)
CSW reached out to daughter Imonie Tuch at [phone]. CSW called twice and left a message.   Emeterio Reeve, Latanya Presser, [place] Social Worker [phone]

## 2020-08-18 NOTE — Progress Notes (Signed)
   Subjective: 4 Days Post-Op Procedure(s) (LRB): Left frontal stereotactic brain biopsy (Left) APPLICATION OF CRANIAL NAVIGATION (N/A) Patient reports pain as mild to moderate knee  answers short questions.   Objective: Vital signs in last 24 hours: Temp:  [98 F (36.7 C)-98.4 F (36.9 C)] 98.4 F (36.9 C) (09/25 0758) Pulse Rate:  [87-98] 87 (09/25 0758) Resp:  [17-21] 18 (09/25 0758) BP: (139-145)/(70-87) 139/87 (09/25 0758) SpO2:  [91 %-98 %] 97 % (09/25 0758)  Intake/Output from previous day: 09/24 0701 - 09/25 0700 In: 560 [P.O.:560] Out: 1300 [Urine:1300] Intake/Output this shift: No intake/output data recorded.  Recent Labs    08/17/20 0359 08/18/20 0327  HGB 11.3* 11.4*   Recent Labs    08/17/20 0359 08/18/20 0327  WBC 7.0 7.2  RBC 3.72* 3.87  HCT 34.7* 35.8*  PLT 205 185   Recent Labs    08/17/20 0359 08/18/20 0327  NA 140 137  K 3.4* 3.8  CL 100 100  CO2 29 30  BUN 23 18  CREATININE 0.42* 0.37*  GLUCOSE 246* 228*  CALCIUM 10.2 9.8   Recent Labs    08/17/20 0359 08/18/20 0327  INR 1.1 1.1    Intact pulses distally No results found.  Assessment/Plan: 4 Days Post-Op Procedure(s) (LRB): Left frontal stereotactic brain biopsy (Left) APPLICATION OF CRANIAL NAVIGATION (N/A) Plan: post ORIF femur. Continue therapy plan.   Marybelle Killings 08/18/2020, 10:12 AM

## 2020-08-18 NOTE — Plan of Care (Signed)
  Problem: Coping: Goal: Level of anxiety will decrease Outcome: Progressing   Problem: Pain Managment: Goal: General experience of comfort will improve Outcome: Progressing   Problem: Safety: Goal: Ability to remain free from injury will improve Outcome: Progressing   

## 2020-08-18 NOTE — Progress Notes (Signed)
   Providing Compassionate, Quality Care - Together  NEUROSURGERY PROGRESS NOTE   S: No issues overnight. No new complaints  O: EXAM:  BP 139/87 (BP Location: Left Arm)   Pulse 87   Temp 98.4 F (36.9 C) (Oral)   Resp 18   Ht 5\' 11"  (1.803 m)   Wt 104.2 kg   SpO2 97%   BMI 32.04 kg/m   Awake, alert, oriented  Speech fluent CNs grossly intact  Incision clean dry and intact Moving all extremities equally Face symmetric  ASSESSMENT:  74 y.o. 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  Please do not hesitate to call with questions or concerns.   Elwin Sleight, [facility] Cell: [phone]

## 2020-08-18 NOTE — Progress Notes (Signed)
Palliative Medicine Inpatient Follow Up Note   AGT:XMIWOE for consult:  Goals of Care "62F admit with femur fracture and found to have brain lesion concerning for glioblastoma. Please assist with GOC/MDM and family support"  HPI:  Per intake H&P --> Susan Odom is a 74 y.o. female with medical history significant of hypertension, diabetes, obesity who presented to the emergency department for difficulties with back pain and inability to walk for 2 weeks time. Over the last 2 weeks she has had 2 visits to the emergency department at [facility]. In regards the right knee she has a distal femur fracture which she received surgery for on 9/18. 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  Today's Discussion (08/18/2020): Chart reviewed. 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 Patient and her daughter met with Dr. Mickeal Skinner yesterday.   Susan Odom is alert when I go to see her this afternoon she asks how I am doing.  We discussed her present situation though it seems that present she is not understanding the situation at hand in its entirety.   Received a call from Susan Odom this late morning. She shares that she would like to know if her mother does or does not want additional treatment. I shared that from what I have gleaned this decision may fall on her shoulders as I worry her mother may not be able to make such a decision right now. Susan Odom vocalizes that she does not know what direction to take. I shared that there is not right or wrong answer - I asked Susan Odom if her mother could speak for herself at this time what she feels that she would want. She said she is going to call [facility] to get a better impression of their symptom management for patients like her mother. We agreed to meet tomorrow morning at 11AM.    Discussed the importance of continued conversation with family and their  medical providers regarding overall plan of care and treatment options, ensuring decisions are within the context of the patients values and GOCs.  Questions and concerns addressed   Subjective Assessment: Vital Signs Vitals:   08/18/20 0758 08/18/20 1624  BP: 139/87 (!) 149/71  Pulse: 87 86  Resp: 18   Temp: 98.4 F (36.9 C) 98.3 F (36.8 C)  SpO2: 97% 98%    Intake/Output Summary (Last 24 hours) at 08/18/2020 1639 Last data filed at 08/18/2020 1300 Gross per 24 hour  Intake 440 ml  Output 1250 ml  Net -810 ml   Last Weight  Most recent update: 08/14/2020  6:29 AM   Weight  104.2 kg (229 lb 11.5 oz)           Gen:  Elderly F in NAD HEENT: Dry mucous membranes CV: Irregular rate and  Regular rhythm, no murmurs rubs or gallops PULM: ON 1LPm [place] clear to auscultation bilaterally. No wheezes/rales/rhonchi  ABD: soft/nontender/nondistended/normal bowel sounds  EXT: RLE w/ bandages at knesssensation in tact Neuro: Somnolent  SUMMARY OF RECOMMENDATIONS DNAR/DNI  E-MOST Completed, electric copy can be found in [facility]  Ongoing PMT support - plan to meet with patients daughter tomorrow at [place]  Time Spent: 25 Greater than 50% of the time was spent in counseling and coordination of care ______________________________________________________________________________________ [place] Team Team Cell Phone: [phone] Please utilize  secure chat with additional questions, if there is no response within 30 minutes please call the above phone number  Palliative Medicine Team providers are available by phone from 7am to 7pm daily and can be reached through the team cell phone.  Should this patient require assistance outside of these hours, please call the patient's attending physician.

## 2020-08-18 NOTE — Progress Notes (Signed)
[facility]                                                                              Patient Demographics  Susan Odom, is a 74 y.o. female, DOB - [birth year], KVQ:[id number]  Admit date - 08/10/2020   Admitting Physician Meredith Pel, MD  Outpatient Primary MD for the patient is Redmond School, MD  Outpatient specialists:   LOS - 8  days   Medical records reviewed and are as summarized below:    Chief Complaint  Patient presents with  . Leg Pain       Brief summary   Susan Odom is a 74 year old female with past medical history notable for essential hypertension, diabetes, obesity, presented to ED with right knee pain for 2 weeks, progressive over the past 24 hours prior to admission.  Associated with swelling.  Patient reported a fall few days prior.  Additionally, daughter reported gradual onset of confusion and cognitive decline recently. Patient initially went to [facility], ED on 08/07/2020 with back pain, MRI notable for degenerative changes. UA concerning for UTI and was discharged home on Keflex, Percocet and prednisone.  Was seen by neurosurgery with worsening back pain and difficulty walking for 2 weeks.  She was unable to stand or bear weight in the office; and subsequently was sent to the emergency department for further evaluation and management.  In the ED, WBC count 14.0, sodium 134, ESR within normal limits, CRP 5.4, Covid-19 PCR negative.  X-ray right hip notable for severe displaced and comminuted oblique fracture distal right femur.  Chest x-ray with no acute findings.  CT right knee with mildly comminuted and slightly displaced fracture distal femoral metadiaphysis.  CT head with lobular masslike lesion predominantly along the corpus callosum with likely surrounding vasogenic edema concerning for intracranial neoplasm such as lymphoma or glioblastoma multiform a.  Neurosurgery and orthopedics were consulted.    Patient was admitted for  further work-up.  Assessment & Plan    Principal Problem:  Right femur fracture - Patient presented with right lower extremity pain and difficulty ambulating -Patient underwent ORIF with intramedullary retrograde femoral nailing on 9/18 by Dr. 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 --Neurosurgery, neuro oncology and palliative care following -Underwent left frontal stereotactic brain biopsy on 9/21, frozen section diagnosis high-grade glial tumor -Continue Decadron, Keppra.   -Decadron 4 g twice daily for 3 days after discharge, followed by 4 g daily, further  taper outpatient -Appreciate Dr. Renda Rolls recommendations, discussed treatment options and prognosis with the patient  Urinary tract infection -UA positive for UTI, urine culture showed multiple species -Completed 3-day course of IV Rocephin on 9/20   Hypokalemia Stable  Essential hypertension -Continue amlodipine, losartan, hydralazine IV as needed with parameters  Type 2 diabetes mellitus On Invokana outpatient, will hold while inpatient. -CBGs continue to remain uncontrolled due to  steroids -Increase the Levemir to 20 units at bedtime, increase meal coverage to 4 units 3 times daily AC, continue sliding scale insulin  Leukocytosis Patient afebrile with a WBC count of 14.0 on admission.    Etiology likely due to steroids, UTI  -Resolved  ?  Aspiration SLP evaluation recommended dysphagia 3 diet  Obesity Estimated body mass index is 32.04 kg/m as calculated from the following:   Height as of this encounter: $RemoveBeforeD'5\' 11"'sDaUfAxZHORlEz$  (1.803 m).   Weight as of this encounter: 104.2 kg.  Code Status: DNR DVT Prophylaxis:   SCD's Family Communication: Discussed all imaging results, lab results, explained to the patient and daughter on the phone.   Disposition Plan:     Status is: Inpatient  Remains inpatient appropriate because:Inpatient level of care appropriate due to severity of illness   Dispo:  Patient From: Home  Planned Disposition: [place]  Expected discharge date: 08/20/2020  Medically stable for discharge: Pending skilled nursing facility when bed available  Time Spent in minutes 25 minutes  Procedures:  Left frontal stereotactic brain biopsy  Consultants:   Neurosurgery Neuro-oncology Orthopedics Palliative medicine  Antimicrobials:   Anti-infectives (From admission, onward)   Start     Dose/Rate Route Frequency Ordered Stop   08/14/20 1500  ceFAZolin (ANCEF) IVPB 2g/100 mL premix        2 g 200 mL/hr over 30 Minutes Intravenous  Once 08/14/20 1456 08/14/20 1528   08/14/20 1457  ceFAZolin (ANCEF) 2-4 GM/100ML-% IVPB       Note to Pharmacy: Grace Blight   : cabinet override      08/14/20 1457 08/14/20 1547   08/11/20 1600  ceFAZolin (ANCEF) IVPB 2g/100 mL premix        2 g 200 mL/hr over 30 Minutes Intravenous Every 6 hours 08/11/20 1436 08/11/20 2301   08/11/20 1430  cefTRIAXone (ROCEPHIN) 1 g in sodium chloride 0.9 % 100 mL IVPB        1 g 200 mL/hr over 30 Minutes Intravenous Every 24 hours 08/10/20 1758 08/13/20 1413   08/11/20  1300  cefTRIAXone (ROCEPHIN) 1 g in sodium chloride 0.9 % 100 mL IVPB  Status:  Discontinued        1 g 200 mL/hr over 30 Minutes Intravenous  Once 08/11/20 1258 08/11/20 1425   08/11/20 1100  vancomycin (VANCOCIN) powder  Status:  Discontinued          As needed 08/11/20 1100 08/11/20 1232   08/11/20 0910  ceFAZolin (ANCEF) IVPB 2g/100 mL premix        2 g 200 mL/hr over 30 Minutes Intravenous To [facility] 08/10/20 2227 08/11/20 0930   08/10/20 1430  cefTRIAXone (ROCEPHIN) 1 g in sodium chloride 0.9 % 100 mL IVPB        1 g 200 mL/hr over 30 Minutes Intravenous  Once 08/10/20 1417 08/10/20 1501         Medications  Scheduled Meds: . amLODipine  10 mg Oral Daily  . Chlorhexidine Gluconate Cloth  6 each Topical Daily  . dexamethasone (DECADRON) injection  4 mg Intravenous Q8H  . docusate sodium  100 mg Oral BID  . feeding supplement (ENSURE ENLIVE)  237 mL Oral BID BM  . insulin aspart  0-15 Units Subcutaneous TID WC  . insulin aspart  0-5 Units Subcutaneous QHS  . insulin aspart  3 Units Subcutaneous TID WC  . insulin detemir  14 Units Subcutaneous QHS  . losartan  100 mg Oral Daily  . mouth rinse  15 mL Mouth Rinse BID  . multivitamin with minerals  1 tablet Oral Daily   Continuous Infusions: . sodium chloride Stopped (08/16/20 1002)  . levETIRAcetam 500 mg (08/18/20 0609)   PRN Meds:.hydrALAZINE, menthol-cetylpyridinium **OR** phenol, metoCLOPramide **OR** metoCLOPramide (REGLAN) injection, ondansetron **OR** ondansetron (ZOFRAN) IV, oxyCODONE      Subjective:   Alsie Younes was seen and examined today.  Currently no acute complaints, still has some cognitive deficits, oriented to self and place.  No acute issues overnight, no fevers, no nausea vomiting or diarrhea.  Objective:   Vitals:   08/18/20 0000 08/18/20 0200 08/18/20 0500 08/18/20 0758  BP:   (!) 145/76 139/87  Pulse:   94 87  Resp:   20 18  Temp:   98 F (36.7 C) 98.4 F (36.9 C)    TempSrc:   Oral Oral  SpO2: 91% 95% 94% 97%  Weight:      Height:        Intake/Output Summary (Last 24 hours) at 08/18/2020 1131 Last data filed at 08/18/2020 0900 Gross per 24 hour  Intake 560 ml  Output 1300 ml  Net -740 ml     Wt Readings from Last 3 Encounters:  08/14/20 104.2 kg  08/06/20 99.8 kg  07/28/20 102.1 kg    Physical Exam  General: Alert and oriented x self, NAD  Cardiovascular: S1 S2 clear, RRR. No pedal edema b/l  Respiratory: CTAB, no wheezing, rales or rhonchi  Gastrointestinal: Soft, nontender, nondistended, NBS  Ext: no pedal edema bilaterally  Neuro: no new deficits  Musculoskeletal: No cyanosis, clubbing  Skin: No rashes  Psych: cognitive deficits    Data Reviewed:  I have personally reviewed following labs and imaging studies  Micro Results Recent Results (from the past 240 hour(s))  SARS Coronavirus 2 by RT PCR (hospital order, performed in [place] hospital lab) Nasopharyngeal Nasopharyngeal Swab     Status: None   Collection Time: 08/10/20  2:33 PM   Specimen: Nasopharyngeal Swab  Result Value Ref Range Status   SARS Coronavirus 2 NEGATIVE NEGATIVE Final    Comment: (NOTE) SARS-CoV-2 target nucleic acids are NOT DETECTED.  The SARS-CoV-2 RNA is generally detectable in upper and lower respiratory specimens during the acute phase of infection. The lowest concentration of SARS-CoV-2 viral copies this assay can detect is 250 copies / mL. A negative result does not preclude SARS-CoV-2 infection and should not be used as the sole basis for treatment or other patient management decisions.  A negative result may occur with improper specimen collection / handling, submission of specimen other than nasopharyngeal swab, presence of viral mutation(s) within the areas targeted by this assay, and inadequate number of viral copies (<250 copies / mL). A negative result must be combined with clinical observations, patient history, and  epidemiological information.  Fact Sheet for Patients:   StrictlyIdeas.no  Fact Sheet for Healthcare Providers: BankingDealers.co.za  This test is not yet approved or  cleared by the [place] FDA and has been authorized for detection and/or diagnosis of SARS-CoV-2 by FDA  under an Emergency Use Authorization (EUA).  This EUA will remain in effect (meaning this test can be used) for the duration of the COVID-19 declaration under Section 564(b)(1) of the Act, 21 U.S.C. section 360bbb-3(b)(1), unless the authorization is terminated or revoked sooner.  Performed at [facility], [place] [address], [place], [place] [address]   MRSA PCR Screening     Status: None   Collection Time: 08/11/20  5:28 AM   Specimen: Nasal Mucosa; Nasopharyngeal  Result Value Ref Range Status   MRSA by PCR NEGATIVE NEGATIVE Final    Comment:        The GeneXpert MRSA Assay (FDA approved for NASAL specimens only), is one component of a comprehensive MRSA colonization surveillance program. It is not intended to diagnose MRSA infection nor to guide or monitor treatment for MRSA infections. Performed at [facility], [place] [address] [place], [place] [address]     Radiology Reports DG Chest 1 View  Result Date: 07/28/2020 CLINICAL DATA:  Fall last night. EXAM: CHEST  1 VIEW COMPARISON:  August 22, 2013. FINDINGS: Stable cardiomediastinal silhouette. Mild bibasilar subsegmental atelectasis is noted. No pneumothorax or pleural effusion is noted. Bony thorax is unremarkable. IMPRESSION: Mild bibasilar subsegmental atelectasis. Electronically Signed   By: Marijo Conception M.D.   On: 07/28/2020 12:47   DG Lumbar Spine Complete  Result Date: 07/28/2020 CLINICAL DATA:  Lower back pain after fall last night. EXAM: LUMBAR SPINE - COMPLETE 4+ VIEW COMPARISON:  None. FINDINGS: No fracture or significant spondylolisthesis is noted. Moderate to severe  degenerative disc disease is noted at all levels of the lumbar spine. 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 No acute abnormality seen in the lumbar spine. Electronically Signed   By: Marijo Conception M.D.   On: 07/28/2020 12:49   DG Knee 1-2 Views Right  Result Date: 08/10/2020 CLINICAL DATA:  Right knee pain after multiple falls. EXAM: RIGHT KNEE - 1-2 VIEW COMPARISON:  None. FINDINGS: Severely displaced and comminuted oblique fracture is seen involving the distal right femur. There is slight overriding of the fracture fragments. Degenerative changes are seen involving the right knee. IMPRESSION: Severely displaced and comminuted oblique fracture of distal right femur. Electronically Signed   By: Marijo Conception M.D.   On: 08/10/2020 13:24   DG Ankle Complete Left  Result Date: 08/10/2020 CLINICAL DATA:  Pain EXAM: LEFT ANKLE COMPLETE - 3+ VIEW COMPARISON:  None. FINDINGS: Bilateral soft tissue swelling.  No fracture or dislocation. IMPRESSION: Bilateral soft tissue swelling without fracture dislocation. Electronically Signed   By: Dorise Bullion III M.D   On: 08/10/2020 19:48   DG Ankle Complete Right  Result Date: 08/11/2020 CLINICAL DATA:  Right hip pain. EXAM: RIGHT ANKLE - COMPLETE 3+ VIEW COMPARISON:  None. FINDINGS: Significant lateral soft tissue swelling identified. No displaced fractures are identified. There is a subtle lucency in the distal fibula medially which is favored to represent a prominent nutrient foramen. This lucency does not extend through the shaft of the distal fibula. The first lateral view was limited as the patient had a dressing noted posteriorly partially obscuring the posterior distal fibula. A repeat film was then obtained without the dressing. No definitive fractures are seen on today's study. Degenerative changes are seen in the mid and hindfoot. IMPRESSION: No convincing evidence of fracture. Degenerative changes in the mid and hindfoot.  Electronically Signed   By: Dorise Bullion III M.D   On: 08/11/2020 09:50  CT HEAD WO CONTRAST  Result Date: 08/15/2020 CLINICAL DATA:  Intracranial mass EXAM: CT HEAD WITHOUT CONTRAST TECHNIQUE: Contiguous axial images were obtained from the base of the skull through the vertex without intravenous contrast. COMPARISON:  08/10/2020 FINDINGS: Brain: The nodular mass involving the septum pellucidum and extending into the genu of the corpus callosum anteriorly as well as the body and isthmus of the corpus callosum posteriorly, asymmetrically more invasive on the left, is again seen. Since the prior examination, there has developed punctate foci of gas as well as acute hemorrhage within the dominant, left colossal and deep white matter portion of the mass in keeping with stereotactic brain biopsy. A tiny burr hole is seen within the left parietal region and a tiny focus of hemorrhage is seen along the needle tract. The lobular hemorrhage within the mass extends into the left lateral ventricle with minimal layering hemorrhage and blood clot within the occipital horn. No significant mass effect noted. Minimal pneumocephalus within the left lateral ventricle related to biopsy. Ventricular size is normal.  Cerebellum unremarkable. Vascular: Unremarkable Skull: Otherwise intact Sinuses/Orbits: Paranasal sinuses are clear. Orbits are unremarkable. Other: Mastoid air cells and middle ear cavities are clear. IMPRESSION: Interval stereotactic biopsy of the left colossal/deep white matter portion of the infiltrative mass with mild lobular hemorrhage in the region of biopsy with slight intraventricular extension. No abnormal mass effect. Normal ventricular size. Electronically Signed   By: Fidela Salisbury MD   On: 08/15/2020 07:23   CT Head Wo Contrast  Result Date: 08/10/2020 CLINICAL DATA:  Mental status change, history of falls EXAM: CT HEAD WITHOUT CONTRAST TECHNIQUE: Contiguous axial images were obtained from the  base of the skull through the vertex without intravenous contrast. COMPARISON:  CT 10/17/2013 FINDINGS: Brain: Ill-defined slightly hyperattenuating lobular masslike lesions about the corpus callosum,, interventricular septum and fornix with some hypoattenuating, likely vasogenic edema crossing genu of the corpus callosum and deep white matter adjacent the left lateral ventricle as well. Overall, incompletely characterized on the CT imaging. No areas of hyperdense hemorrhage. Some local mass effect and partial effacement of the lateral ventricles is noted. No extra-axial collection or hemorrhage is evident. Chronic mild expansion of the retro cerebellar CSF space likely reflecting arachnoid cyst versus mega cisterna magna. Symmetric prominence of the ventricles, cisterns and sulci compatible with parenchymal volume loss. Chronic small vessel ischemic changes are similar to priors. Vascular: Atherosclerotic calcification of the carotid siphons and intradural vertebral arteries. No hyperdense vessel. Skull: No calvarial fracture or suspicious osseous lesion. No scalp swelling or hematoma. Sinuses/Orbits: Paranasal sinuses and mastoid air cells are predominantly clear. Included orbital structures are unremarkable. Other: None IMPRESSION: Lobular masslike lesion centered predominantly along the corpus callosum, interventricular septum and fornix with likely surrounding vasogenic edema which crosses the genu of the corpus callosum and is also present the left frontal white matter. Primary differential considerations would include a intracranial neoplasm such as CNS lymphoma or glioblastoma multiforme. Overall, incompletely characterized on this unenhanced head CT. Recommend further evaluation with MRI with contrast. No other acute intracranial abnormality. Background of some mild chronic parenchymal volume loss and microvascular angiopathy changes with intracranial atherosclerosis. These results were called by telephone  at the time of interpretation on 08/10/2020 at 4:01 pm to provider [facility] , who verbally acknowledged these results. Electronically Signed   By: Lovena Le M.D.   On: 08/10/2020 16:01   CT Knee Right Wo Contrast  Result Date: 08/10/2020 CLINICAL DATA:  Frequent falls.  Distal  femur fracture. EXAM: CT OF THE RIGHT KNEE WITHOUT CONTRAST TECHNIQUE: Multidetector CT imaging of the right knee was performed according to the standard protocol. Multiplanar CT image reconstructions were also generated. COMPARISON:  Radiographs same date. FINDINGS: Bones/Joint/Cartilage Oblique fracture of the distal femoral metadiaphysis is again noted with mild comminution. This fracture is associated with up to 4.3 cm of medial displacement as well as significant overriding of the fracture fragments. There is no intra-articular extension of the fracture. The patella, proximal tibia and proximal fibula are intact. There are advanced tricompartmental degenerative changes at the knee with large osteophytes and intra-articular loose bodies. There is a small joint effusion. Ligaments Suboptimally assessed by CT. The anterior cruciate ligament is not well visualized. Muscles and Tendons The extensor mechanism is intact. Mild generalized muscular atrophy. Soft tissues Small hematoma adjacent to the fracture. No foreign body, soft tissue emphysema or bone destruction. IMPRESSION: 1. Mildly comminuted and significantly displaced fracture of the distal femoral metadiaphysis as described. No intra-articular extension of the fracture. 2. 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 Electronically Signed   By: Richardean Sale M.D.   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 Electronically Signed   By: Kellie Simmering DO   On: 08/13/2020 22:04   Result Date: 08/13/2020 CLINICAL DATA:  Provided history: Brain mass or lesion; brain lab protocol for surgery 921. EXAM: MRI HEAD WITH CONTRAST TECHNIQUE: Multiplanar, multiecho pulse sequences of the brain and surrounding structures were obtained with intravenous contrast. CONTRAST:  79mL GADAVIST GADOBUTROL 1 MMOL/ML IV SOLN COMPARISON:  Brain MRI with and without contrast 08/10/2020, head CT 08/10/2020. FINDINGS: Brain: Unchanged appearance of lobulated and infiltrative masslike T2/FLAIR hyperintense signal abnormality expanding the body of the corpus callosum with nodular extension along the septum pellucidum and contiguous spread along the margins of the frontal horns of both lateral ventricles. As before, the lesion crosses midline via the body of the corpus callosum with centrum semiovale involvement (greater on the left) and frontal horn involvement (greater on the right). Also unchanged, there is corresponding heterogeneous contrast enhancement throughout the affected areas. Redemonstrated small areas of discontinuous petechial enhancement, most notably within the callosal splenium. Unchanged suspicious abnormal leptomeningeal enhancement with both internal auditory canals and questionable abnormal leptomeningeal enhancement along the ventral brainstem. Background mild scattered T2/FLAIR hyperintensity within the cerebral white matter and brainstem is nonspecific, but most commonly seen on the basis of chronic small vessel ischemia. Vascular: Expected enhancement within the proximal large arterial vessels and dural venous sinuses. Skull and upper cervical spine: No suspicious focal marrow lesion is identified on the acquired sequences. Sinuses/Orbits: No appreciable acute orbital abnormality on the acquired sequences. Mild paranasal  sinus mucosal thickening, most notably ethmoidal. Trace fluid within left mastoid air cells. IMPRESSION: Central callosal, septal and periventricular infiltrative an enhancing tumor most suspicious for glioblastoma/high-grade glioma, unchanged as compared to 08/10/2020. Lymphoma can have a similar imaging appearance, but is considered less likely. 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 Electronically Signed: By: Kellie Simmering DO On: 08/13/2020 21:56   MR BRAIN W WO CONTRAST  Result Date: 08/10/2020 CLINICAL  DATA:  74 year old female with mental status changes, falls. Abnormal noncontrast head today suggesting a pericallosal mass. EXAM: MRI HEAD WITHOUT AND WITH CONTRAST TECHNIQUE: Multiplanar, multiecho pulse sequences of the brain and surrounding structures were obtained without and with intravenous contrast. CONTRAST:  42mL GADAVIST GADOBUTROL 1 MMOL/ML IV SOLN COMPARISON:  Head CT earlier today. Prior head CTs 10/17/2013 and earlier. FINDINGS: Brain: Lobulated, infiltrative masslike T2 and FLAIR hyperintense lesion expanding the body of the corpus callosum, with nodular extension along the septum pellucidum, and contiguous spread in and around the frontal horns of both lateral ventricles. The lesion crosses midline via the body of the corpus callosum, with left predominant centrum semiovale but right predominant frontal horn regional involvement. Much of the lesion shows mildly to moderately restricted diffusion but relative T2 hyperintensity. There is petechial hemorrhage in the central colo cell and pericallosal white matter portion on SWI (series 12, image 35). Following contrast there is heterogeneous enhancement throughout the affected areas. And there are occasional small discontinuous areas of petechial enhancement (most notably in the splenium of the corpus callosum on series 20, image 13. All  told, the lesion encompasses roughly 69 x 51 x 36 mm (AP by transverse by CC). Despite the fairly extensive periventricular configuration, no disseminated ependymal enhancement is identified. However, there is suspicious abnormal leptomeningeal enhancement in both internal auditory canals right greater than left (series 18, image 14). And there is also questionable abnormally increased leptomeningeal enhancement along the ventral brainstem (series 20, image 13). No other abnormal meningeal enhancement. No ventriculomegaly or transependymal edema. No significant intracranial mass effect. Basilar cisterns remain patent. No superimposed restricted diffusion suggestive of acute infarction. No extra-axial collection or acute intracranial hemorrhage. Cervicomedullary junction and pituitary are within normal limits. Vascular: Major intracranial vascular flow voids are preserved, with generalized intracranial artery tortuosity. The major dural venous sinuses are enhancing and appear to be patent. Skull and upper cervical spine: Negative visible cervical spine and spinal cord. Normal bone marrow signal. Sinuses/Orbits: Negative. Other: Scalp and face appear negative. IMPRESSION: 1. 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 CNS lymphoma felt less likely. Area of involvement encompasses 69 x 51 x 36 mm. 2. 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 3. No significant intracranial mass effect. 4. Recommend Neuro-Oncology consultation. Electronically Signed   By: Genevie Ann M.D.   On: 08/10/2020 18:03   MR LUMBAR SPINE WO CONTRAST  Result Date: 08/07/2020 CLINICAL DATA:  Low back pain and bilateral leg pain over the last few days. EXAM: MRI LUMBAR SPINE WITHOUT CONTRAST TECHNIQUE:  Multiplanar, multisequence MR imaging of the lumbar spine was performed. No intravenous contrast was administered. COMPARISON:  CT 07/28/2020 and 07/13/2020. FINDINGS: Segmentation:  5 lumbar type vertebral bodies. Alignment: Mild thoracolumbar curvature convex to the left and lower lumbar curvature convex to the right. No antero or retrolisthesis. Vertebrae: Old healed minor superior endplate compression deformity at L1 with maximal loss of height 40%. No retropulsed bone. No evidence of recent fracture in the region from inferior T11 to superior S3. Conus medullaris and cauda equina: Conus extends to the L2 level. Conus and cauda equina appear normal. Paraspinal and other soft tissues: No significant finding. Disc levels: Minimal non-compressive disc bulges at T11-12, T12-L1 and L1-2. L2-3: Moderate disc bulge. Slight indentation of the thecal sac but no apparent compressive stenosis. L3-4:  Endplate osteophytes and moderate disc bulge more prominent towards the left. Mild narrowing of the lateral recesses but no visible compressive stenosis. L4-5: Endplate osteophytes and bulging of the disc. Mild narrowing of the lateral recesses but no compressive stenosis. L5-S1: Endplate osteophytes and bulging of the disc. No canal stenosis. Bilateral foraminal narrowing right more than left that would have some potential to affect either or both L5 nerves, more likely the right. IMPRESSION: 1. Old healed minor superior endplate compression deformity at L1 with maximal loss of height of 40%. No retropulsed bone. No evidence of recent fracture. No evidence of sacral insufficiency fracture as question on the previous CT. 2. 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 No compressive central canal stenosis. Bilateral foraminal narrowing at L5-S1 right worse than left that would have some potential to affect either or both L5 nerves, more likely the right. Electronically Signed   By: Nelson Chimes M.D.    On: 08/07/2020 09:24   CT ABDOMEN PELVIS W CONTRAST  Result Date: 07/28/2020 CLINICAL DATA:  Low back and right hip pain since falling from bed last night. EXAM: CT ABDOMEN AND PELVIS WITH CONTRAST TECHNIQUE: Multidetector CT imaging of the abdomen and pelvis was performed using the standard protocol following bolus administration of intravenous contrast. CONTRAST:  123mL OMNIPAQUE IOHEXOL 300 MG/ML  SOLN COMPARISON:  Lumbar spine and right hip radiographs same date. CT lumbar spine 07/13/2020. FINDINGS: Lower chest: Atherosclerosis of the aorta and coronary arteries. Possible calcifications of the aortic valve. The heart size is normal. There is no significant pleural or pericardial effusion. Mild atelectasis is present at both lung bases. Hepatobiliary: The liver is normal in density without suspicious focal abnormality. No evidence of gallstones, gallbladder wall thickening or biliary dilatation. Pancreas: Unremarkable. No pancreatic ductal dilatation or surrounding inflammatory changes. Spleen: Small central calcifications, likely granulomas. No splenomegaly or acute abnormality. Adrenals/Urinary Tract: Both adrenal glands appear normal. No evidence of acute kidney injury, urinary tract calculus or hydronephrosis. 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 Dependent high density in the right aspect of the bladder may reflect a small bladder calculus. No definite contrast excretion into the bladder. Stomach/Bowel: No evidence of bowel wall thickening, distention or surrounding inflammatory change. Vascular/Lymphatic: There are no enlarged abdominal or pelvic lymph nodes. No evidence of retroperitoneal hematoma. There is diffuse aortic and branch vessel atherosclerosis. As above, stable right renal artery aneurysm. Reproductive: Probable fiducial clips in the lower uterine segment.  The uterus and ovaries otherwise appear normal. Other: Intact anterior abdominal wall. No ascites, hemoperitoneum or pneumoperitoneum. Musculoskeletal: No acute or significant osseous findings. There is a stable chronic superior endplate compression deformity at L1. There is stable multilevel lumbar spondylosis. Stable sclerosis in the sacral ala bilaterally. IMPRESSION: 1. No acute findings or explanation for the patient's symptoms. 2. 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 3. Possible small bladder calculus. 4. Aortic Atherosclerosis (ICD10-I70.0). CT ADDITIONAL VIEWS (CT OF THE RIGHT HIP) Small field-of-view reformatted images through the right hip were obtained. There is no evidence of acute fracture or dislocation. There are mild right hip degenerative changes. There are stable degenerative changes at the right sacroiliac joint and bilateral sacral ala sclerosis. No significant soft tissue abnormalities in the proximal right thigh. Electronically Signed   By: Richardean Sale M.D.   On: 07/28/2020 14:20   DG Chest Portable 1 View  Result Date: 08/10/2020 CLINICAL DATA:  Altered mental status EXAM: PORTABLE CHEST 1 VIEW COMPARISON:  Radiograph 07/28/2020 FINDINGS: Chronically coarsened interstitial changes in the lungs are not significantly changed from comparison studies. These are slightly more pronounced in the left lung than right. No new focal consolidative opacity is seen. No visible pneumothorax or effusion though portion of the left costophrenic sulcus is collimated from view. Cardiac size is within normal limits for portable technique. The aorta is calcified. The remaining cardiomediastinal contours are unremarkable. No acute osseous or soft tissue abnormality. Degenerative changes are present in the imaged spine and shoulders. Telemetry leads and nasal cannula overlie the chest. IMPRESSION: Chronically coarsened interstitial changes in the lungs. No convincing new focal  consolidative opacity. Electronically Signed   By: Lovena Le M.D.   On: 08/10/2020 15:08   DG C-Arm 1-60 Min  Result Date: 08/11/2020 CLINICAL DATA:  Intramedullary nail placement. EXAM: RIGHT FEMUR 2 VIEWS; DG C-ARM 1-60 MIN COMPARISON:  08/10/2020 FINDINGS: Placement of right intramedullary femoral nail with proximal and distal anchoring screws. Hardware is intact as there is anatomic alignment over patient's distal femoral diametaphyseal fracture. Remainder the exam is unchanged. IMPRESSION: Internal fixation of distal femoral diametaphyseal fracture in anatomic alignment. Hardware intact and in adequate position. Electronically Signed   By: Marin Olp M.D.   On: 08/11/2020 13:45   CT NO CHARGE  Result Date: 07/28/2020 CLINICAL DATA:  Low back and right hip pain since falling from bed last night. EXAM: CT ABDOMEN AND PELVIS WITH CONTRAST TECHNIQUE: Multidetector CT imaging of the abdomen and pelvis was performed using the standard protocol following bolus administration of intravenous contrast. CONTRAST:  171mL OMNIPAQUE IOHEXOL 300 MG/ML  SOLN COMPARISON:  Lumbar spine and right hip radiographs same date. CT lumbar spine 07/13/2020. FINDINGS: Lower chest: Atherosclerosis of the aorta and coronary arteries. Possible calcifications of the aortic valve. The heart size is normal. There is no significant pleural or pericardial effusion. Mild atelectasis is present at both lung bases. Hepatobiliary: The liver is normal in density without suspicious focal abnormality. No evidence of gallstones, gallbladder wall thickening or biliary dilatation. Pancreas: Unremarkable. No pancreatic ductal dilatation or surrounding inflammatory changes. Spleen: Small central calcifications, likely granulomas. No splenomegaly or acute abnormality. Adrenals/Urinary Tract: Both adrenal glands appear normal. No evidence of acute kidney injury, urinary tract calculus or hydronephrosis. The right renal collecting system is  partially duplicated. 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 Dependent high density in the right aspect of the bladder may reflect a small bladder calculus. No definite contrast excretion into the bladder. Stomach/Bowel: No evidence of bowel wall thickening, distention or surrounding inflammatory change. Vascular/Lymphatic: There are no enlarged abdominal or pelvic lymph nodes. No evidence of retroperitoneal hematoma. There is diffuse aortic and branch vessel atherosclerosis. As above, stable right renal artery aneurysm. Reproductive: Probable fiducial clips in the lower uterine segment. The uterus and ovaries otherwise appear normal. Other: Intact anterior abdominal wall. No ascites, hemoperitoneum or pneumoperitoneum. Musculoskeletal: No acute or significant osseous findings. There is a stable chronic superior endplate compression deformity at L1. There is stable multilevel lumbar spondylosis. Stable sclerosis in the sacral ala bilaterally. IMPRESSION: 1. No acute findings or explanation for the patient's symptoms. 2. 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 3. Possible small bladder calculus. 4. Aortic Atherosclerosis (ICD10-I70.0). CT ADDITIONAL VIEWS (CT OF THE RIGHT HIP) Small field-of-view reformatted images through the right hip were obtained. There  is no evidence of acute fracture or dislocation. There are mild right hip degenerative changes. There are stable degenerative changes at the right sacroiliac joint and bilateral sacral ala sclerosis. No significant soft tissue abnormalities in the proximal right thigh. Electronically Signed   By: Richardean Sale M.D.   On: 07/28/2020 14:20   DG HIP UNILAT WITH PELVIS 2-3 VIEWS RIGHT  Result Date: 08/10/2020 CLINICAL DATA:  Right hip pain. EXAM: DG HIP (WITH OR WITHOUT PELVIS) 2-3V RIGHT COMPARISON:  None. FINDINGS: There is no evidence of hip fracture or dislocation.  There is no evidence of arthropathy or other focal bone abnormality. IMPRESSION: Negative. Electronically Signed   By: Dorise Bullion III M.D   On: 08/10/2020 19:42   DG HIP UNILAT WITH PELVIS 2-3 VIEWS RIGHT  Result Date: 08/07/2020 CLINICAL DATA:  Status post fall with subsequent right hip pain. EXAM: DG HIP (WITH OR WITHOUT PELVIS) 2-3V RIGHT COMPARISON:  July 28, 2020 FINDINGS: There is no evidence of an acute hip fracture or dislocation. The mild to moderate severity degenerative changes seen involving both hips. Radiopaque surgical clips are seen overlying the lower pelvis. IMPRESSION: No acute osseous abnormality. Electronically Signed   By: Virgina Norfolk M.D.   On: 08/07/2020 00:58   DG Hip Unilat W or Wo Pelvis 2-3 Views Right  Result Date: 07/28/2020 CLINICAL DATA:  Right hip pain after fall. EXAM: DG HIP (WITH OR WITHOUT PELVIS) 2-3V RIGHT COMPARISON:  None. FINDINGS: There is no evidence of hip fracture or dislocation. There is no evidence of arthropathy or other focal bone abnormality. IMPRESSION: Negative. Electronically Signed   By: Marijo Conception M.D.   On: 07/28/2020 12:46   DG FEMUR 1V RIGHT  Result Date: 08/11/2020 CLINICAL DATA:  Fixation of right femoral fracture. EXAM: RIGHT FEMUR 1 VIEW COMPARISON:  08/10/2020 FINDINGS: Examination demonstrates evidence of patient's right femoral intramedullary nail ridging known distal femoral diametaphyseal fracture. There are proximal and distal anchoring screws. Hardware is intact with anatomic alignment over the fracture site. Exam is otherwise unchanged. IMPRESSION: Fixation of right femoral diametaphyseal fracture with hardware intact and anatomic alignment over the fracture site. Electronically Signed   By: Marin Olp M.D.   On: 08/11/2020 13:48   DG FEMUR, MIN 2 VIEWS RIGHT  Result Date: 08/11/2020 CLINICAL DATA:  Intramedullary nail placement. EXAM: RIGHT FEMUR 2 VIEWS; DG C-ARM 1-60 MIN COMPARISON:  08/10/2020 FINDINGS:  Placement of right intramedullary femoral nail with proximal and distal anchoring screws. Hardware is intact as there is anatomic alignment over patient's distal femoral diametaphyseal fracture. Remainder the exam is unchanged. IMPRESSION: Internal fixation of distal femoral diametaphyseal fracture in anatomic alignment. Hardware intact and in adequate position. Electronically Signed   By: Marin Olp M.D.   On: 08/11/2020 13:45    Lab Data:  CBC: Recent Labs  Lab 08/13/20 0150 08/14/20 0446 08/15/20 1001 08/17/20 0359 08/18/20 0327  WBC 7.4 6.0 5.4 7.0 7.2  HGB 10.2* 10.5* 11.3* 11.3* 11.4*  HCT 31.6* 32.2* 34.1* 34.7* 35.8*  MCV 92.4 92.8 93.4 93.3 92.5  PLT 152 166 184 205 488   Basic Metabolic Panel: Recent Labs  Lab 08/13/20 0150 08/14/20 0446 08/15/20 1001 08/17/20 0359 08/18/20 0327  NA 138 134* 135 140 137  K 3.4* 3.5 4.1 3.4* 3.8  CL 103 99 99 100 100  CO2 $Re'25 26 22 29 30  'Wab$ GLUCOSE 188* 186* 214* 246* 228*  BUN $Re'10 9 13 23 18  'kvG$ CREATININE 0.48 0.55 0.60 0.42*  0.37*  CALCIUM 9.3 9.2 9.3 10.2 9.8  MG  --  1.7 2.1  --   --    GFR: Estimated Creatinine Clearance: 82 mL/min (A) (by C-G formula based on SCr of 0.37 mg/dL (L)). Liver Function Tests: No results for input(s): AST, ALT, ALKPHOS, BILITOT, PROT, ALBUMIN in the last 168 hours. No results for input(s): LIPASE, AMYLASE in the last 168 hours. No results for input(s): AMMONIA in the last 168 hours. Coagulation Profile: Recent Labs  Lab 08/14/20 0446 08/15/20 1001 08/16/20 1647 08/17/20 0359 08/18/20 0327  INR 1.1 1.2 1.2 1.1 1.1   Cardiac Enzymes: No results for input(s): CKTOTAL, CKMB, CKMBINDEX, TROPONINI in the last 168 hours. BNP (last 3 results) No results for input(s): PROBNP in the last 8760 hours. HbA1C: No results for input(s): HGBA1C in the last 72 hours. CBG: Recent Labs  Lab 08/17/20 0637 08/17/20 1201 08/17/20 1711 08/17/20 2027 08/18/20 0636  GLUCAP 230* 262* 227* 330* 212*    Lipid Profile: No results for input(s): CHOL, HDL, LDLCALC, TRIG, CHOLHDL, LDLDIRECT in the last 72 hours. Thyroid Function Tests: No results for input(s): TSH, T4TOTAL, FREET4, T3FREE, THYROIDAB in the last 72 hours. Anemia Panel: No results for input(s): VITAMINB12, FOLATE, FERRITIN, TIBC, IRON, RETICCTPCT in the last 72 hours. Urine analysis:    Component Value Date/Time   COLORURINE AMBER (A) 08/10/2020 1730   APPEARANCEUR CLOUDY (A) 08/10/2020 1730   LABSPEC 1.028 08/10/2020 1730   PHURINE 5.0 08/10/2020 1730   GLUCOSEU >=500 (A) 08/10/2020 1730   HGBUR LARGE (A) 08/10/2020 1730   BILIRUBINUR NEGATIVE 08/10/2020 1730   KETONESUR 20 (A) 08/10/2020 1730   PROTEINUR NEGATIVE 08/10/2020 1730   UROBILINOGEN 0.2 08/23/2013 1404   NITRITE NEGATIVE 08/10/2020 1730   LEUKOCYTESUR LARGE (A) 08/10/2020 1730     Yossi Hinchman M.D. [facility] 08/18/2020, 11:31 AM   Call night coverage person covering after 7pm

## 2020-08-18 NOTE — Plan of Care (Signed)
  Problem: Education: Goal: Knowledge of General Education information will improve Description: Including pain rating scale, medication(s)/side effects and non-pharmacologic comfort measures Outcome: Progressing   Problem: Health Behavior/Discharge Planning: Goal: Ability to manage health-related needs will improve Outcome: Progressing   Problem: Clinical Measurements: Goal: Will remain free from infection Outcome: Progressing   Problem: Nutrition: Goal: Adequate nutrition will be maintained Outcome: Progressing   Problem: Elimination: Goal: Will not experience complications related to bowel motility Outcome: Progressing

## 2020-08-19 LAB — CBC
HCT: 35.2 % — ABNORMAL LOW (ref 36.0–46.0)
Hemoglobin: 11.1 g/dL — ABNORMAL LOW (ref 12.0–15.0)
MCH: 29.5 pg (ref 26.0–34.0)
MCHC: 31.5 g/dL (ref 30.0–36.0)
MCV: 93.6 fL (ref 80.0–100.0)
Platelets: 167 10*3/uL (ref 150–400)
RBC: 3.76 MIL/uL — ABNORMAL LOW (ref 3.87–5.11)
RDW: 14.3 % (ref 11.5–15.5)
WBC: 8.1 10*3/uL (ref 4.0–10.5)
nRBC: 0 % (ref 0.0–0.2)

## 2020-08-19 LAB — GLUCOSE, CAPILLARY
Glucose-Capillary: 230 mg/dL — ABNORMAL HIGH (ref 70–99)
Glucose-Capillary: 301 mg/dL — ABNORMAL HIGH (ref 70–99)
Glucose-Capillary: 182 mg/dL — ABNORMAL HIGH (ref 70–99)
Glucose-Capillary: 104 mg/dL — ABNORMAL HIGH (ref 70–99)

## 2020-08-19 LAB — BASIC METABOLIC PANEL
Anion gap: 7 (ref 5–15)
BUN: 17 mg/dL (ref 8–23)
CO2: 30 mmol/L (ref 22–32)
Calcium: 9.9 mg/dL (ref 8.9–10.3)
Chloride: 100 mmol/L (ref 98–111)
Creatinine, Ser: 0.33 mg/dL — ABNORMAL LOW (ref 0.44–1.00)
GFR calc Af Amer: >60 mL/min (ref >60)
GFR calc non Af Amer: >60 mL/min (ref >60)
Glucose, Bld: 250 mg/dL — ABNORMAL HIGH (ref 70–99)
Potassium: 4.2 mmol/L (ref 3.5–5.1)
Sodium: 137 mmol/L (ref 135–145)

## 2020-08-19 LAB — PROTIME-INR
INR: 1.1 (ref 0.8–1.2)
Prothrombin Time: 13.9 seconds (ref 11.4–15.2)

## 2020-08-19 MED ORDER — INSULIN DETEMIR 100 UNIT/ML ~~LOC~~ SOLN
25.0000 [IU] | Freq: Every day | SUBCUTANEOUS | Status: DC
  Administered 2020-08-19 – 2020-08-20 (×2): 25 [IU] via SUBCUTANEOUS
  Filled 2020-08-19 (×4): qty 0.25

## 2020-08-19 MED ORDER — INSULIN ASPART 100 UNIT/ML ~~LOC~~ SOLN
5.0000 [IU] | Freq: Three times a day (TID) | SUBCUTANEOUS | Status: DC
  Administered 2020-08-19 – 2020-08-21 (×7): 5 [IU] via SUBCUTANEOUS

## 2020-08-19 NOTE — Progress Notes (Signed)
   Palliative Medicine Inpatient Follow Up Note   DJS:[id number] for consult:  Goals of Care "61F admit with femur fracture and found to have brain lesion concerning for glioblastoma. Please assist with GOC/MDM and family support"  HPI:  Per intake H&P --> Susan Odom is a 74 y.o. female with medical history significant of hypertension, diabetes, obesity who presented to the emergency department for difficulties with back pain and inability to walk for 2 weeks time. Over the last 2 weeks she has had 2 visits to the emergency department at [facility]. In regards the right knee she has a distal femur fracture which she received surgery for on 9/18. 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  Today's Discussion (08/19/2020): Chart reviewed. 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 Patient and her daughter met with Dr. Mickeal Skinner on Friday. Patients daughter had spoken to her mother about this more.   Susan Odom is unclear about if she would want radiation treatments as she shares she has had them in the past - she is unable to elaborate much on this. We reviewed radiation and chemotherapy. We discussed that glioblastoma  overall has an exceptionally poor prognosis despite treatment, We talked about what is important to the patient. We discussed her three grandchildren and four dogs and how one of her dogs provides her with extreme joy. We talked about making the time left count as much as possible and enjoying doing things that bring happiness.   As of present the plan is for patient to transition to SNF to gain additional strength in RLE and whole body.   Discussed the importance of continued conversation with family and their  medical providers regarding overall plan of care and treatment options, ensuring decisions are within the  context of the patients values and GOCs.  Questions and concerns addressed   SUMMARY OF RECOMMENDATIONS DNAR/DNI  E-MOST Completed, electric copy can be found in [place] for transition to SNF  Daughter has number for [facility] hospice for additional questions  Time Spent: 35 Greater than 50% of the time was spent in counseling and coordination of care ______________________________________________________________________________________ [place] Team Team Cell Phone: [phone] Please utilize secure chat with additional questions, if there is no response within 30 minutes please call the above phone number  Palliative Medicine Team providers are available by phone from 7am to 7pm daily and can be reached through the team cell phone.  Should this patient require assistance outside of these hours, please call the patient's attending physician.

## 2020-08-19 NOTE — Progress Notes (Signed)
   Subjective: 5 Days Post-Op Procedure(s) (LRB): Left frontal stereotactic brain biopsy (Left) APPLICATION OF CRANIAL NAVIGATION (N/A) Patient reports pain as mild.    Objective: Vital signs in last 24 hours: Temp:  [97.4 F (36.3 C)-98.3 F (36.8 C)] 97.4 F (36.3 C) (09/26 0739) Pulse Rate:  [71-86] 72 (09/26 0739) Resp:  [15-17] 17 (09/26 0739) BP: (140-157)/(63-74) 157/72 (09/26 0739) SpO2:  [92 %-98 %] 92 % (09/26 0739) Weight:  [100.6 kg] 100.6 kg (09/26 0500)  Intake/Output from previous day: 09/25 0701 - 09/26 0700 In: 120 [P.O.:120] Out: 800 [Urine:800] Intake/Output this shift: No intake/output data recorded.  Recent Labs    08/17/20 0359 08/18/20 0327 08/19/20 0158  HGB 11.3* 11.4* 11.1*   Recent Labs    08/18/20 0327 08/19/20 0158  WBC 7.2 8.1  RBC 3.87 3.76*  HCT 35.8* 35.2*  PLT 185 167   Recent Labs    08/18/20 0327 08/19/20 0158  NA 137 137  K 3.8 4.2  CL 100 100  CO2 30 30  BUN 18 17  CREATININE 0.37* 0.33*  GLUCOSE 228* 250*  CALCIUM 9.8 9.9   Recent Labs    08/18/20 0327 08/19/20 0158  INR 1.1 1.1    dressing intact No results found.  Assessment/Plan: 5 Days Post-Op Procedure(s) (LRB): Left frontal stereotactic brain biopsy (Left) APPLICATION OF CRANIAL NAVIGATION (N/A) Plan:  Doing OK from Ortho standpoint. Answers short questions.   Marybelle Killings 08/19/2020, 9:03 AM

## 2020-08-19 NOTE — TOC Initial Note (Signed)
Transition of Care [facility]) - Initial/Assessment Note    Patient Details  Name: Susan Odom MRN: [id number] Date of Birth: 05-Sep-1946  Transition of Care [facility]) CM/SW Contact:    Joanne Chars, LCSW Phone Number: 08/19/2020, 2:22 PM  Clinical Narrative:   CSW met with pt and pt daughter  Susan Odom to discuss DC plan.  Pt awake but not able to participate significantly, pt did give permission to speak with daughter and appeared to be able to understand.  Pt and daughter met with palliative earlier, goals are rehab and pt and daughter both stated would like to pursue SNF.  Pt gave permission to send out info in hub.  Pt is not vaccinated for covid.  Pt from [place], first choice would be [facility].  Alvis Lemmings just began [facility] services prior to this admission.                Expected Discharge Plan: Skilled Nursing Facility Barriers to Discharge: Continued Medical Work up, SNF Pending bed offer   Patient Goals and CMS Choice Patient states their goals for this hospitalization and ongoing recovery are:: get back walking CMS Medicare.gov Compare Post Acute Care list provided to:: Patient Represenative (must comment) Choice offered to / list presented to : Adult Children  Expected Discharge Plan and Services Expected Discharge Plan: [place] Acute Care Choice: [place] arrangements for the past 2 months: Single Family Home                                      Prior Living Arrangements/Services Living arrangements for the past 2 months: Single Family Home Lives with:: Adult Children (daughter Susan Odom) Patient language and need for interpreter reviewed:: Yes Do you feel safe going back to the place where you live?: Yes      Need for Family Participation in Patient Care: Yes (Comment) Care giver support system in place?: Yes (comment) Current home services: Home PT Alvis Lemmings) Criminal Activity/Legal Involvement Pertinent to Current  Situation/Hospitalization: No - Comment as needed  Activities of Daily Living Home Assistive Devices/Equipment: Cane (specify quad or straight), Walker (specify type), CBG Meter ADL Screening (condition at time of admission) Patient's cognitive ability adequate to safely complete daily activities?: Yes Is the patient deaf or have difficulty hearing?: No Does the patient have difficulty seeing, even when wearing glasses/contacts?: No Does the patient have difficulty concentrating, remembering, or making decisions?: No Patient able to express need for assistance with ADLs?: Yes Does the patient have difficulty dressing or bathing?: No Independently performs ADLs?: Yes (appropriate for developmental age) Does the patient have difficulty walking or climbing stairs?: Yes Weakness of Legs: None Weakness of Arms/Hands: None  Permission Sought/Granted Permission sought to share information with : Facility Sport and exercise psychologist, Family Supports Permission granted to share information with : Yes, Verbal Permission Granted  Share Information with NAME: Susan Odom, daughter  Permission granted to share info w AGENCY: SNF        Emotional Assessment Appearance:: Appears stated age Attitude/Demeanor/Rapport: Unable to Assess Affect (typically observed): Unable to Assess Orientation: : Oriented to Self, Oriented to Place, Oriented to  Time Alcohol / Substance Use: Not Applicable Psych Involvement: No (comment)  Admission diagnosis:  Transient alteration of awareness [R40.4] Leg pain [M79.606] Hip pain [M25.559] Abnormal CT of the head [R93.0] Femur fracture, right (HCC) [S72.91XA] Closed displaced oblique fracture of shaft  of right femur, initial encounter ([place]) [S72.331A] Urinary tract infection with hematuria, site unspecified [N39.0, R31.9] Femur fracture ([place]) [S72.90XA] Patient Active Problem List   Diagnosis Date Noted  . Femur fracture ([place]) 08/11/2020  . Palliative care by specialist    . DNR (do not resuscitate)   . Goals of care, counseling/discussion   . Femur fracture, right ([place])   . Leukocytosis   . Brain mass   . Blunt trauma of hip   . Class 1 obesity due to excess calories with body mass index (BMI) of 32.0 to 32.9 in adult   . Hypertension   . Rhabdomyolysis 07/28/2020  . Fall 07/28/2020  . Pressure injury of skin 07/28/2020  . Generalized weakness 07/28/2020   PCP:  Redmond School, MD Pharmacy:   [facility], [place] [address] [place] [address] Phone: [phone] Fax: [phone]     Social Determinants of Health (SDOH) Interventions    Readmission Risk Interventions No flowsheet data found.

## 2020-08-19 NOTE — NC FL2 (Signed)
[place] LEVEL OF CARE SCREENING TOOL     IDENTIFICATION  Patient Name: Susan Odom Birthdate: 1946-03-21 Sex: female Admission Date (Current Location): 08/10/2020  [facility] and [place] Number:  Herbalist and Address:  [facility] [place] [place] [address] [place], [place] [address]      Provider Number: [id number]  Attending Physician Name and Address:  Mendel Corning, MD  Relative Name and Phone Number:  Zalaya, Astarita, Daughter [phone]    Current Level of Care: Hospital Recommended Level of Care: [place] Prior Approval Number:    Date Approved/Denied:   PASRR Number:    Discharge Plan: SNF    Current Diagnoses: Patient Active Problem List   Diagnosis Date Noted   Femur fracture ([place]) 08/11/2020   Palliative care by specialist    DNR (do not resuscitate)    Goals of care, counseling/discussion    Femur fracture, right (HCC)    Leukocytosis    Brain mass    Blunt trauma of hip    Class 1 obesity due to excess calories with body mass index (BMI) of 32.0 to 32.9 in adult    Hypertension    Rhabdomyolysis 07/28/2020   Fall 07/28/2020   Pressure injury of skin 07/28/2020   Generalized weakness 07/28/2020    Orientation RESPIRATION BLADDER Height & Weight     Self, Time, Place, Situation  Normal Incontinent Weight: 221 lb 12.5 oz (100.6 kg) Height:  5\' 11"  (180.3 cm)  BEHAVIORAL SYMPTOMS/MOOD NEUROLOGICAL BOWEL NUTRITION STATUS      Incontinent Diet (DYS 3, see DC summary)  AMBULATORY STATUS COMMUNICATION OF NEEDS Skin   Total Care Verbally Surgical wounds                       Personal Care Assistance Level of Assistance  Bathing, Feeding, Dressing Bathing Assistance: Maximum assistance Feeding assistance: Limited assistance Dressing Assistance: Maximum assistance     Functional Limitations Info  Sight, Hearing, Speech Sight Info:  Adequate Hearing Info: Adequate Speech Info: Adequate    SPECIAL CARE FACTORS FREQUENCY  PT (By licensed PT), Speech therapy     PT Frequency: 5x week       Speech Therapy Frequency: 3x week      Contractures Contractures Info: Not present    Additional Factors Info  Code Status, Allergies Code Status Info: DNR Allergies Info: NKA           Current Medications (08/19/2020):  This is the current hospital active medication list Current Facility-Administered Medications  Medication Dose Route Frequency Provider Last Rate Last Admin   0.9 %  sodium chloride infusion   Intravenous Continuous Osie Cheeks, NP   Stopped at 08/16/20 1002   amLODipine (NORVASC) tablet 10 mg  10 mg Oral Daily Osie Cheeks, NP   10 mg at 08/19/20 1029   Chlorhexidine Gluconate Cloth 2 % PADS 6 each  6 each Topical Daily Osie Cheeks, NP   6 each at 08/19/20 1029   dexamethasone (DECADRON) injection 4 mg  4 mg Intravenous Q8H Osie Cheeks, NP   4 mg at 08/19/20 0552   docusate sodium (COLACE) capsule 100 mg  100 mg Oral BID Osie Cheeks, NP   100 mg at 08/19/20 1029   feeding supplement (ENSURE ENLIVE) (ENSURE ENLIVE) liquid 237 mL  237 mL Oral BID BM Rai, Ripudeep K, MD   237 mL at 08/19/20 1338   hydrALAZINE (APRESOLINE)  injection 10 mg  10 mg Intravenous Q6H PRN Osie Cheeks, NP       insulin aspart (novoLOG) injection 0-15 Units  0-15 Units Subcutaneous TID WC Osie Cheeks, NP   11 Units at 08/19/20 1210   insulin aspart (novoLOG) injection 0-5 Units  0-5 Units Subcutaneous QHS Osie Cheeks, NP   4 Units at 08/17/20 2113   insulin aspart (novoLOG) injection 5 Units  5 Units Subcutaneous TID WC Rai, Ripudeep K, MD   5 Units at 08/19/20 1300   insulin detemir (LEVEMIR) injection 25 Units  25 Units Subcutaneous QHS Rai, Ripudeep K, MD       levETIRAcetam (KEPPRA) tablet 500 mg  500 mg Oral BID Esmond Plants, RPH   500 mg at 08/19/20 1029   losartan (COZAAR) tablet 100 mg  100 mg Oral Daily Osie Cheeks, NP    100 mg at 08/19/20 1029   MEDLINE mouth rinse  15 mL Mouth Rinse BID Rai, Ripudeep K, MD   15 mL at 08/19/20 1031   menthol-cetylpyridinium (CEPACOL) lozenge 3 mg  1 lozenge Oral PRN Osie Cheeks, NP       Or   phenol (CHLORASEPTIC) mouth spray 1 spray  1 spray Mouth/Throat PRN Osie Cheeks, NP       metoCLOPramide (REGLAN) tablet 5-10 mg  5-10 mg Oral Q8H PRN Osie Cheeks, NP       Or   metoCLOPramide (REGLAN) injection 5-10 mg  5-10 mg Intravenous Q8H PRN Osie Cheeks, NP       multivitamin with minerals tablet 1 tablet  1 tablet Oral Daily Rai, Ripudeep K, MD   1 tablet at 08/19/20 1029   ondansetron (ZOFRAN) tablet 4 mg  4 mg Oral Q6H PRN Osie Cheeks, NP       Or   ondansetron (ZOFRAN) injection 4 mg  4 mg Intravenous Q6H PRN Osie Cheeks, NP       oxyCODONE (Oxy IR/ROXICODONE) immediate release tablet 5 mg  5 mg Oral Q4H PRN Osie Cheeks, NP   5 mg at 08/18/20 2300     Discharge Medications: Please see discharge summary for a list of discharge medications.  Relevant Imaging Results:  Relevant Lab Results:   Additional Information SSN [id number]  Joanne Chars, LCSW

## 2020-08-19 NOTE — Progress Notes (Signed)
[facility]                                                                              Patient Demographics  Susan Odom, is a 74 y.o. female, DOB - Mar 13, 1946, STM:[id number]  Admit date - 08/10/2020   Admitting Physician Meredith Pel, MD  Outpatient Primary MD for the patient is Redmond School, MD  Outpatient specialists:   LOS - 9  days   Medical records reviewed and are as summarized below:    Chief Complaint  Patient presents with  . Leg Pain       Brief summary   Susan Odom is a 74 year old female with past medical history notable for essential hypertension, diabetes, obesity, presented to ED with right knee pain for 2 weeks, progressive over the past 24 hours prior to admission.  Associated with swelling.  Patient reported a fall few days prior.  Additionally, daughter reported gradual onset of confusion and cognitive decline recently. Patient initially went to [facility], ED on 08/07/2020 with back pain, MRI notable for degenerative changes. UA concerning for UTI and was discharged home on Keflex, Percocet and prednisone.  Was seen by neurosurgery with worsening back pain and difficulty walking for 2 weeks.  She was unable to stand or bear weight in the office; and subsequently was sent to the emergency department for further evaluation and management.  In the ED, WBC count 14.0, sodium 134, ESR within normal limits, CRP 5.4, Covid-19 PCR negative.  X-ray right hip notable for severe displaced and comminuted oblique fracture distal right femur.  Chest x-ray with no acute findings.  CT right knee with mildly comminuted and slightly displaced fracture distal femoral metadiaphysis.  CT head with lobular masslike lesion predominantly along the corpus callosum with likely surrounding vasogenic edema concerning for intracranial neoplasm such as lymphoma or glioblastoma multiform a.  Neurosurgery and orthopedics were consulted.    Patient was admitted for  further work-up.  Assessment & Plan    Principal Problem:  Right femur fracture - Patient presented with right lower extremity pain and difficulty ambulating - Patient underwent ORIF with intramedullary retrograde femoral nailing on 9/18 by Dr. Marlou Sa - Per orthopedics, [facility] RLE for transfers only x2 weeks, then weightbearing as tolerated per orthopedics -Continue pain control, aspirin 81 mg daily for DVT prophylaxis, orthopedics following.  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 -Underwent left frontal stereotactic brain biopsy on 9/21, frozen section diagnosis high-grade glial tumor -Continue Decadron, Keppra.   -For discharge, continue Decadron 4 g twice daily for 3 days followed by 4 g daily, further taper outpatient.  Continue Keppra -Appreciate Dr. Renda Rolls recommendations, discussed treatment options and prognosis with the patient and  her family  Urinary tract infection -UA positive for UTI, urine culture showed multiple species -Completed 3-day course of IV Rocephin on 9/20  Hypokalemia Resolved  Essential hypertension -Continue amlodipine, losartan, hydralazine IV as needed with parameters  Type 2 diabetes mellitus On Invokana outpatient, will hold while inpatient. -CBGs remain uncontrolled due to steroids, hopefully will improve with taper -Increase Levemir to 25 units qhs, NovoLog 5 units 3 times daily  AC, continue sliding scale insulin   Leukocytosis Patient afebrile with a WBC count of 14.0 on admission.    Etiology likely due to steroids, UTI  -Resolved  ?  Aspiration SLP evaluation recommended dysphagia 3 diet  Obesity Estimated body mass index is 30.93 kg/m as calculated from the following:   Height as of this encounter: $RemoveBeforeD'5\' 11"'wNDIcYTgGyDRha$  (1.803 m).   Weight as of this encounter: 100.6 kg.  Code Status: DNR DVT Prophylaxis:   SCD's Family Communication: Discussed all imaging results, lab results, explained to the patient and daughter on the phone   Disposition Plan:     Status is: Inpatient  Remains inpatient appropriate because:Inpatient level of care appropriate due to severity of illness   Dispo:  Patient From: Home  Planned Disposition: [facility]  Expected discharge date: 08/20/2020  Medically stable for discharge: Pending skilled nursing facility when bed available  Time Spent in minutes 25 minutes  Procedures:  Left frontal stereotactic brain biopsy  Consultants:   Neurosurgery Neuro-oncology Orthopedics Palliative medicine  Antimicrobials:   Anti-infectives (From admission, onward)   Start     Dose/Rate Route Frequency Ordered Stop   08/14/20 1500  ceFAZolin (ANCEF) IVPB 2g/100 mL premix        2 g 200 mL/hr over 30 Minutes Intravenous  Once 08/14/20 1456 08/14/20 1528   08/14/20 1457  ceFAZolin (ANCEF) 2-4 GM/100ML-% IVPB       Note to Pharmacy: Grace Blight   : cabinet override      08/14/20 1457 08/14/20 1547   08/11/20 1600  ceFAZolin (ANCEF) IVPB 2g/100 mL premix        2 g 200 mL/hr over 30 Minutes Intravenous Every 6 hours 08/11/20 1436 08/11/20 2301   08/11/20 1430  cefTRIAXone (ROCEPHIN) 1 g in sodium chloride 0.9 % 100 mL IVPB        1 g 200 mL/hr over 30 Minutes Intravenous Every 24 hours 08/10/20 1758 08/13/20 1413   08/11/20 1300  cefTRIAXone (ROCEPHIN) 1 g in sodium chloride 0.9 % 100 mL IVPB  Status:  Discontinued        1  g 200 mL/hr over 30 Minutes Intravenous  Once 08/11/20 1258 08/11/20 1425   08/11/20 1100  vancomycin (VANCOCIN) powder  Status:  Discontinued          As needed 08/11/20 1100 08/11/20 1232   08/11/20 0910  ceFAZolin (ANCEF) IVPB 2g/100 mL premix        2 g 200 mL/hr over 30 Minutes Intravenous To [facility] 08/10/20 2227 08/11/20 0930   08/10/20 1430  cefTRIAXone (ROCEPHIN) 1 g in sodium chloride 0.9 % 100 mL IVPB        1 g 200 mL/hr over 30 Minutes Intravenous  Once 08/10/20 1417 08/10/20 1501         Medications  Scheduled Meds: . amLODipine  10 mg Oral Daily  . Chlorhexidine Gluconate Cloth  6 each Topical Daily  . dexamethasone (DECADRON) injection  4 mg Intravenous Q8H  . docusate sodium  100 mg Oral BID  .  feeding supplement (ENSURE ENLIVE)  237 mL Oral BID BM  . insulin aspart  0-15 Units Subcutaneous TID WC  . insulin aspart  0-5 Units Subcutaneous QHS  . insulin aspart  4 Units Subcutaneous TID WC  . insulin detemir  20 Units Subcutaneous QHS  . levETIRAcetam  500 mg Oral BID  . losartan  100 mg Oral Daily  . mouth rinse  15 mL Mouth Rinse BID  . multivitamin with minerals  1 tablet Oral Daily   Continuous Infusions: . sodium chloride Stopped (08/16/20 1002)   PRN Meds:.hydrALAZINE, menthol-cetylpyridinium **OR** phenol, metoCLOPramide **OR** metoCLOPramide (REGLAN) injection, ondansetron **OR** ondansetron (ZOFRAN) IV, oxyCODONE      Subjective:   Susan Odom was seen and examined today.  No acute complaints, pain is controlled, continues to have cognitive deficits, responds in short answers.  No acute issues overnight.  No ongoing fevers, nausea or vomiting.  Objective:   Vitals:   08/18/20 1927 08/19/20 0359 08/19/20 0500 08/19/20 0739  BP: (!) 144/74 140/63  (!) 157/72  Pulse: 86 71  72  Resp: $Remo'16 15  17  'UEVig$ Temp: 97.6 F (36.4 C) (!) 97.4 F (36.3 C)  (!) 97.4 F (36.3 C)  TempSrc: Oral Oral  Oral  SpO2: 97% 94%  92%  Weight:   100.6 kg    Height:        Intake/Output Summary (Last 24 hours) at 08/19/2020 1122 Last data filed at 08/19/2020 0900 Gross per 24 hour  Intake 120 ml  Output 1400 ml  Net -1280 ml     Wt Readings from Last 3 Encounters:  08/19/20 100.6 kg  08/06/20 99.8 kg  07/28/20 102.1 kg   Physical Exam  General: Alert and oriented x self and place, NAD  Cardiovascular: S1 S2 clear, RRR. No pedal edema b/l  Respiratory: CTAB, no wheezing, rales or rhonchi  Gastrointestinal: Soft, nontender, nondistended, NBS  Ext: no pedal edema bilaterally  Neuro: no new deficits  Musculoskeletal: No cyanosis, clubbing  Skin: No rashes  Psych: continues to have cognitive deficits  Data Reviewed:  I have personally reviewed following labs and imaging studies  Micro Results Recent Results (from the past 240 hour(s))  SARS Coronavirus 2 by RT PCR (hospital order, performed in [place] hospital lab) Nasopharyngeal Nasopharyngeal Swab     Status: None   Collection Time: 08/10/20  2:33 PM   Specimen: Nasopharyngeal Swab  Result Value Ref Range Status   SARS Coronavirus 2 NEGATIVE NEGATIVE Final    Comment: (NOTE) SARS-CoV-2 target nucleic acids are NOT DETECTED.  The SARS-CoV-2 RNA is generally detectable in upper and lower respiratory specimens during the acute phase of infection. The lowest concentration of SARS-CoV-2 viral copies this assay can detect is 250 copies / mL. A negative result does not preclude SARS-CoV-2 infection and should not be used as the sole basis for treatment or other patient management decisions.  A negative result may occur with improper specimen collection / handling, submission of specimen other than nasopharyngeal swab, presence of viral mutation(s) within the areas targeted by this assay, and inadequate number of viral copies (<250 copies / mL). A negative result must be combined with clinical observations, patient history, and epidemiological information.  Fact  Sheet for Patients:   StrictlyIdeas.no  Fact Sheet for Healthcare Providers: BankingDealers.co.za  This test is not yet approved or  cleared by the [place] FDA and has been authorized for detection and/or diagnosis of SARS-CoV-2 by FDA under an Emergency Use Authorization (  EUA).  This EUA will remain in effect (meaning this test can be used) for the duration of the COVID-19 declaration under Section 564(b)(1) of the Act, 21 U.S.C. section 360bbb-3(b)(1), unless the authorization is terminated or revoked sooner.  Performed at [facility], [place] [address] [place], [place] [address]   MRSA PCR Screening     Status: None   Collection Time: 08/11/20  5:28 AM   Specimen: Nasal Mucosa; Nasopharyngeal  Result Value Ref Range Status   MRSA by PCR NEGATIVE NEGATIVE Final    Comment:        The GeneXpert MRSA Assay (FDA approved for NASAL specimens only), is one component of a comprehensive MRSA colonization surveillance program. It is not intended to diagnose MRSA infection nor to guide or monitor treatment for MRSA infections. Performed at [facility], [place] [address], [place], [place] [address]     Radiology Reports DG Chest 1 View  Result Date: 07/28/2020 CLINICAL DATA:  Fall last night. EXAM: CHEST  1 VIEW COMPARISON:  August 22, 2013. FINDINGS: Stable cardiomediastinal silhouette. Mild bibasilar subsegmental atelectasis is noted. No pneumothorax or pleural effusion is noted. Bony thorax is unremarkable. IMPRESSION: Mild bibasilar subsegmental atelectasis. Electronically Signed   By: Marijo Conception M.D.   On: 07/28/2020 12:47   DG Lumbar Spine Complete  Result Date: 07/28/2020 CLINICAL DATA:  Lower back pain after fall last night. EXAM: LUMBAR SPINE - COMPLETE 4+ VIEW COMPARISON:  None. FINDINGS: No fracture or significant spondylolisthesis is noted. Moderate to severe degenerative disc disease is noted at all  levels of the lumbar spine. 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 No acute abnormality seen in the lumbar spine. Electronically Signed   By: Marijo Conception M.D.   On: 07/28/2020 12:49   DG Knee 1-2 Views Right  Result Date: 08/10/2020 CLINICAL DATA:  Right knee pain after multiple falls. EXAM: RIGHT KNEE - 1-2 VIEW COMPARISON:  None. FINDINGS: Severely displaced and comminuted oblique fracture is seen involving the distal right femur. There is slight overriding of the fracture fragments. Degenerative changes are seen involving the right knee. IMPRESSION: Severely displaced and comminuted oblique fracture of distal right femur. Electronically Signed   By: Marijo Conception M.D.   On: 08/10/2020 13:24   DG Ankle Complete Left  Result Date: 08/10/2020 CLINICAL DATA:  Pain EXAM: LEFT ANKLE COMPLETE - 3+ VIEW COMPARISON:  None. FINDINGS: Bilateral soft tissue swelling.  No fracture or dislocation. IMPRESSION: Bilateral soft tissue swelling without fracture dislocation. Electronically Signed   By: Dorise Bullion III M.D   On: 08/10/2020 19:48   DG Ankle Complete Right  Result Date: 08/11/2020 CLINICAL DATA:  Right hip pain. EXAM: RIGHT ANKLE - COMPLETE 3+ VIEW COMPARISON:  None. FINDINGS: Significant lateral soft tissue swelling identified. No displaced fractures are identified. There is a subtle lucency in the distal fibula medially which is favored to represent a prominent nutrient foramen. This lucency does not extend through the shaft of the distal fibula. The first lateral view was limited as the patient had a dressing noted posteriorly partially obscuring the posterior distal fibula. A repeat film was then obtained without the dressing. No definitive fractures are seen on today's study. Degenerative changes are seen in the mid and hindfoot. IMPRESSION: No convincing evidence of fracture. Degenerative changes in the mid and hindfoot. Electronically Signed   By: Dorise Bullion III M.D   On: 08/11/2020 09:50   CT HEAD WO  CONTRAST  Result Date: 08/15/2020 CLINICAL DATA:  Intracranial mass EXAM: CT HEAD WITHOUT CONTRAST TECHNIQUE: Contiguous axial images were obtained from the base of the skull through the vertex without intravenous contrast. COMPARISON:  08/10/2020 FINDINGS: Brain: The nodular mass involving the septum pellucidum and extending into the genu of the corpus callosum anteriorly as well as the body and isthmus of the corpus callosum posteriorly, asymmetrically more invasive on the left, is again seen. Since the prior examination, there has developed punctate foci of gas as well as acute hemorrhage within the dominant, left colossal and deep white matter portion of the mass in keeping with stereotactic brain biopsy. A tiny burr hole is seen within the left parietal region and a tiny focus of hemorrhage is seen along the needle tract. The lobular hemorrhage within the mass extends into the left lateral ventricle with minimal layering hemorrhage and blood clot within the occipital horn. No significant mass effect noted. Minimal pneumocephalus within the left lateral ventricle related to biopsy. Ventricular size is normal.  Cerebellum unremarkable. Vascular: Unremarkable Skull: Otherwise intact Sinuses/Orbits: Paranasal sinuses are clear. Orbits are unremarkable. Other: Mastoid air cells and middle ear cavities are clear. IMPRESSION: Interval stereotactic biopsy of the left colossal/deep white matter portion of the infiltrative mass with mild lobular hemorrhage in the region of biopsy with slight intraventricular extension. No abnormal mass effect. Normal ventricular size. Electronically Signed   By: Fidela Salisbury MD   On: 08/15/2020 07:23   CT Head Wo Contrast  Result Date: 08/10/2020 CLINICAL DATA:  Mental status change, history of falls EXAM: CT HEAD WITHOUT CONTRAST TECHNIQUE: Contiguous axial images were obtained from the base of the skull through the vertex  without intravenous contrast. COMPARISON:  CT 10/17/2013 FINDINGS: Brain: Ill-defined slightly hyperattenuating lobular masslike lesions about the corpus callosum,, interventricular septum and fornix with some hypoattenuating, likely vasogenic edema crossing genu of the corpus callosum and deep white matter adjacent the left lateral ventricle as well. Overall, incompletely characterized on the CT imaging. No areas of hyperdense hemorrhage. Some local mass effect and partial effacement of the lateral ventricles is noted. No extra-axial collection or hemorrhage is evident. Chronic mild expansion of the retro cerebellar CSF space likely reflecting arachnoid cyst versus mega cisterna magna. Symmetric prominence of the ventricles, cisterns and sulci compatible with parenchymal volume loss. Chronic small vessel ischemic changes are similar to priors. Vascular: Atherosclerotic calcification of the carotid siphons and intradural vertebral arteries. No hyperdense vessel. Skull: No calvarial fracture or suspicious osseous lesion. No scalp swelling or hematoma. Sinuses/Orbits: Paranasal sinuses and mastoid air cells are predominantly clear. Included orbital structures are unremarkable. Other: None IMPRESSION: Lobular masslike lesion centered predominantly along the corpus callosum, interventricular septum and fornix with likely surrounding vasogenic edema which crosses the genu of the corpus callosum and is also present the left frontal white matter. Primary differential considerations would include a intracranial neoplasm such as CNS lymphoma or glioblastoma multiforme. Overall, incompletely characterized on this unenhanced head CT. Recommend further evaluation with MRI with contrast. No other acute intracranial abnormality. Background of some mild chronic parenchymal volume loss and microvascular angiopathy changes with intracranial atherosclerosis. These results were called by telephone at the time of interpretation on  08/10/2020 at 4:01 pm to provider [facility] , who verbally acknowledged these results. Electronically Signed   By: Lovena Le M.D.   On: 08/10/2020 16:01   CT Knee Right Wo Contrast  Result Date: 08/10/2020 CLINICAL DATA:  Frequent falls.  Distal femur fracture. EXAM:  CT OF THE RIGHT KNEE WITHOUT CONTRAST TECHNIQUE: Multidetector CT imaging of the right knee was performed according to the standard protocol. Multiplanar CT image reconstructions were also generated. COMPARISON:  Radiographs same date. FINDINGS: Bones/Joint/Cartilage Oblique fracture of the distal femoral metadiaphysis is again noted with mild comminution. This fracture is associated with up to 4.3 cm of medial displacement as well as significant overriding of the fracture fragments. There is no intra-articular extension of the fracture. The patella, proximal tibia and proximal fibula are intact. There are advanced tricompartmental degenerative changes at the knee with large osteophytes and intra-articular loose bodies. There is a small joint effusion. Ligaments Suboptimally assessed by CT. The anterior cruciate ligament is not well visualized. Muscles and Tendons The extensor mechanism is intact. Mild generalized muscular atrophy. Soft tissues Small hematoma adjacent to the fracture. No foreign body, soft tissue emphysema or bone destruction. IMPRESSION: 1. Mildly comminuted and significantly displaced fracture of the distal femoral metadiaphysis as described. No intra-articular extension of the fracture. 2. 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 Electronically Signed   By: Richardean Sale M.D.   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 Electronically Signed   By: Kellie Simmering DO   On: 08/13/2020 22:04   Result Date: 08/13/2020 CLINICAL DATA:  Provided history: Brain mass or lesion; brain lab protocol for surgery 921. EXAM: MRI HEAD WITH CONTRAST TECHNIQUE: Multiplanar, multiecho pulse sequences of the brain and surrounding structures were obtained with intravenous contrast. CONTRAST:  34mL GADAVIST GADOBUTROL 1 MMOL/ML IV SOLN COMPARISON:  Brain MRI with and without contrast 08/10/2020, head CT 08/10/2020. FINDINGS: Brain: Unchanged appearance of lobulated and infiltrative masslike T2/FLAIR hyperintense signal abnormality expanding the body of the corpus callosum with nodular extension along the septum pellucidum and contiguous spread along the margins of the frontal horns of both lateral ventricles. As before, the lesion crosses midline via the body of the corpus callosum with centrum semiovale involvement (greater on the left) and frontal horn involvement (greater on the right). Also unchanged, there is corresponding heterogeneous contrast enhancement throughout the affected areas. Redemonstrated small areas of discontinuous petechial enhancement, most notably within the callosal splenium. Unchanged suspicious abnormal leptomeningeal enhancement with both internal auditory canals and questionable abnormal leptomeningeal enhancement along the ventral brainstem. Background mild scattered T2/FLAIR hyperintensity within the cerebral white matter and brainstem is nonspecific, but most commonly seen on the basis of chronic small vessel ischemia. Vascular: Expected enhancement within the proximal large arterial vessels and dural venous sinuses. Skull and upper cervical spine: No suspicious focal marrow lesion is identified on the acquired sequences. Sinuses/Orbits: No appreciable acute orbital abnormality on the acquired sequences. Mild paranasal sinus mucosal thickening, most  notably ethmoidal. Trace fluid within left mastoid air cells. IMPRESSION: Central callosal, septal and periventricular infiltrative an enhancing tumor most suspicious for glioblastoma/high-grade glioma, unchanged as compared to 08/10/2020. Lymphoma can have a similar imaging appearance, but is considered less likely. 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 Electronically Signed: By: Kellie Simmering DO On: 08/13/2020 21:56   MR BRAIN W WO CONTRAST  Result Date: 08/10/2020 CLINICAL DATA:  74 year old  female with mental status changes, falls. Abnormal noncontrast head today suggesting a pericallosal mass. EXAM: MRI HEAD WITHOUT AND WITH CONTRAST TECHNIQUE: Multiplanar, multiecho pulse sequences of the brain and surrounding structures were obtained without and with intravenous contrast. CONTRAST:  28mL GADAVIST GADOBUTROL 1 MMOL/ML IV SOLN COMPARISON:  Head CT earlier today. Prior head CTs 10/17/2013 and earlier. FINDINGS: Brain: Lobulated, infiltrative masslike T2 and FLAIR hyperintense lesion expanding the body of the corpus callosum, with nodular extension along the septum pellucidum, and contiguous spread in and around the frontal horns of both lateral ventricles. The lesion crosses midline via the body of the corpus callosum, with left predominant centrum semiovale but right predominant frontal horn regional involvement. Much of the lesion shows mildly to moderately restricted diffusion but relative T2 hyperintensity. There is petechial hemorrhage in the central colo cell and pericallosal white matter portion on SWI (series 12, image 35). Following contrast there is heterogeneous enhancement throughout the affected areas. And there are occasional small discontinuous areas of petechial enhancement (most notably in the splenium of the corpus callosum on series 20, image 13. All told, the lesion encompasses  roughly 69 x 51 x 36 mm (AP by transverse by CC). Despite the fairly extensive periventricular configuration, no disseminated ependymal enhancement is identified. However, there is suspicious abnormal leptomeningeal enhancement in both internal auditory canals right greater than left (series 18, image 14). And there is also questionable abnormally increased leptomeningeal enhancement along the ventral brainstem (series 20, image 13). No other abnormal meningeal enhancement. No ventriculomegaly or transependymal edema. No significant intracranial mass effect. Basilar cisterns remain patent. No superimposed restricted diffusion suggestive of acute infarction. No extra-axial collection or acute intracranial hemorrhage. Cervicomedullary junction and pituitary are within normal limits. Vascular: Major intracranial vascular flow voids are preserved, with generalized intracranial artery tortuosity. The major dural venous sinuses are enhancing and appear to be patent. Skull and upper cervical spine: Negative visible cervical spine and spinal cord. Normal bone marrow signal. Sinuses/Orbits: Negative. Other: Scalp and face appear negative. IMPRESSION: 1. 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 CNS lymphoma felt less likely. Area of involvement encompasses 69 x 51 x 36 mm. 2. 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 3. No significant intracranial mass effect. 4. Recommend Neuro-Oncology consultation. Electronically Signed   By: Odessa Fleming M.D.   On: 08/10/2020 18:03   MR LUMBAR SPINE WO CONTRAST  Result Date: 08/07/2020 CLINICAL DATA:  Low back pain and bilateral leg pain over the last few days. EXAM: MRI LUMBAR SPINE WITHOUT CONTRAST TECHNIQUE: Multiplanar, multisequence MR imaging  of the lumbar spine was performed. No intravenous contrast was administered. COMPARISON:  CT 07/28/2020 and 07/13/2020. FINDINGS: Segmentation:  5 lumbar type vertebral bodies. Alignment: Mild thoracolumbar curvature convex to the left and lower lumbar curvature convex to the right. No antero or retrolisthesis. Vertebrae: Old healed minor superior endplate compression deformity at L1 with maximal loss of height 40%. No retropulsed bone. No evidence of recent fracture in the region from inferior T11 to superior S3. Conus medullaris and cauda equina: Conus extends to the L2 level. Conus and cauda equina appear normal. Paraspinal and other soft tissues: No significant finding. Disc levels: Minimal non-compressive disc bulges at T11-12, T12-L1 and L1-2. L2-3: Moderate disc bulge. Slight indentation of the thecal sac but no apparent compressive stenosis. L3-4: Endplate osteophytes and  moderate disc bulge more prominent towards the left. Mild narrowing of the lateral recesses but no visible compressive stenosis. L4-5: Endplate osteophytes and bulging of the disc. Mild narrowing of the lateral recesses but no compressive stenosis. L5-S1: Endplate osteophytes and bulging of the disc. No canal stenosis. Bilateral foraminal narrowing right more than left that would have some potential to affect either or both L5 nerves, more likely the right. IMPRESSION: 1. Old healed minor superior endplate compression deformity at L1 with maximal loss of height of 40%. No retropulsed bone. No evidence of recent fracture. No evidence of sacral insufficiency fracture as question on the previous CT. 2. 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 No compressive central canal stenosis. Bilateral foraminal narrowing at L5-S1 right worse than left that would have some potential to affect either or both L5 nerves, more likely the right. Electronically Signed   By: Nelson Chimes M.D.   On: 08/07/2020 09:24   CT ABDOMEN  PELVIS W CONTRAST  Result Date: 07/28/2020 CLINICAL DATA:  Low back and right hip pain since falling from bed last night. EXAM: CT ABDOMEN AND PELVIS WITH CONTRAST TECHNIQUE: Multidetector CT imaging of the abdomen and pelvis was performed using the standard protocol following bolus administration of intravenous contrast. CONTRAST:  122mL OMNIPAQUE IOHEXOL 300 MG/ML  SOLN COMPARISON:  Lumbar spine and right hip radiographs same date. CT lumbar spine 07/13/2020. FINDINGS: Lower chest: Atherosclerosis of the aorta and coronary arteries. Possible calcifications of the aortic valve. The heart size is normal. There is no significant pleural or pericardial effusion. Mild atelectasis is present at both lung bases. Hepatobiliary: The liver is normal in density without suspicious focal abnormality. No evidence of gallstones, gallbladder wall thickening or biliary dilatation. Pancreas: Unremarkable. No pancreatic ductal dilatation or surrounding inflammatory changes. Spleen: Small central calcifications, likely granulomas. No splenomegaly or acute abnormality. Adrenals/Urinary Tract: Both adrenal glands appear normal. No evidence of acute kidney injury, urinary tract calculus or hydronephrosis. 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 Dependent high density in the right aspect of the bladder may reflect a small bladder calculus. No definite contrast excretion into the bladder. Stomach/Bowel: No evidence of bowel wall thickening, distention or surrounding inflammatory change. Vascular/Lymphatic: There are no enlarged abdominal or pelvic lymph nodes. No evidence of retroperitoneal hematoma. There is diffuse aortic and branch vessel atherosclerosis. As above, stable right renal artery aneurysm. Reproductive: Probable fiducial clips in the lower uterine segment. The uterus and ovaries otherwise appear  normal. Other: Intact anterior abdominal wall. No ascites, hemoperitoneum or pneumoperitoneum. Musculoskeletal: No acute or significant osseous findings. There is a stable chronic superior endplate compression deformity at L1. There is stable multilevel lumbar spondylosis. Stable sclerosis in the sacral ala bilaterally. IMPRESSION: 1. No acute findings or explanation for the patient's symptoms. 2. 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 3. Possible small bladder calculus. 4. Aortic Atherosclerosis (ICD10-I70.0). CT ADDITIONAL VIEWS (CT OF THE RIGHT HIP) Small field-of-view reformatted images through the right hip were obtained. There is no evidence of acute fracture or dislocation. There are mild right hip degenerative changes. There are stable degenerative changes at the right sacroiliac joint and bilateral sacral ala sclerosis. No significant soft tissue abnormalities in the proximal right thigh. Electronically Signed   By: Richardean Sale M.D.   On: 07/28/2020 14:20   DG Chest Portable 1 View  Result Date:  08/10/2020 CLINICAL DATA:  Altered mental status EXAM: PORTABLE CHEST 1 VIEW COMPARISON:  Radiograph 07/28/2020 FINDINGS: Chronically coarsened interstitial changes in the lungs are not significantly changed from comparison studies. These are slightly more pronounced in the left lung than right. No new focal consolidative opacity is seen. No visible pneumothorax or effusion though portion of the left costophrenic sulcus is collimated from view. Cardiac size is within normal limits for portable technique. The aorta is calcified. The remaining cardiomediastinal contours are unremarkable. No acute osseous or soft tissue abnormality. Degenerative changes are present in the imaged spine and shoulders. Telemetry leads and nasal cannula overlie the chest. IMPRESSION: Chronically coarsened interstitial changes in the lungs. No convincing new focal consolidative opacity. Electronically Signed    By: Lovena Le M.D.   On: 08/10/2020 15:08   DG C-Arm 1-60 Min  Result Date: 08/11/2020 CLINICAL DATA:  Intramedullary nail placement. EXAM: RIGHT FEMUR 2 VIEWS; DG C-ARM 1-60 MIN COMPARISON:  08/10/2020 FINDINGS: Placement of right intramedullary femoral nail with proximal and distal anchoring screws. Hardware is intact as there is anatomic alignment over patient's distal femoral diametaphyseal fracture. Remainder the exam is unchanged. IMPRESSION: Internal fixation of distal femoral diametaphyseal fracture in anatomic alignment. Hardware intact and in adequate position. Electronically Signed   By: Marin Olp M.D.   On: 08/11/2020 13:45   CT NO CHARGE  Result Date: 07/28/2020 CLINICAL DATA:  Low back and right hip pain since falling from bed last night. EXAM: CT ABDOMEN AND PELVIS WITH CONTRAST TECHNIQUE: Multidetector CT imaging of the abdomen and pelvis was performed using the standard protocol following bolus administration of intravenous contrast. CONTRAST:  153mL OMNIPAQUE IOHEXOL 300 MG/ML  SOLN COMPARISON:  Lumbar spine and right hip radiographs same date. CT lumbar spine 07/13/2020. FINDINGS: Lower chest: Atherosclerosis of the aorta and coronary arteries. Possible calcifications of the aortic valve. The heart size is normal. There is no significant pleural or pericardial effusion. Mild atelectasis is present at both lung bases. Hepatobiliary: The liver is normal in density without suspicious focal abnormality. No evidence of gallstones, gallbladder wall thickening or biliary dilatation. Pancreas: Unremarkable. No pancreatic ductal dilatation or surrounding inflammatory changes. Spleen: Small central calcifications, likely granulomas. No splenomegaly or acute abnormality. Adrenals/Urinary Tract: Both adrenal glands appear normal. No evidence of acute kidney injury, urinary tract calculus or hydronephrosis. 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 A peripherally calcified 2.2 x  1.7 cm right renal artery aneurysm is unchanged from the recent lumbar spine CT. Dependent high density in the right aspect of the bladder may reflect a small bladder calculus. No definite contrast excretion into the bladder. Stomach/Bowel: No evidence of bowel wall thickening, distention or surrounding inflammatory change. Vascular/Lymphatic: There are no enlarged abdominal or pelvic lymph nodes. No evidence of retroperitoneal hematoma. There is diffuse aortic and branch vessel atherosclerosis. As above, stable right renal artery aneurysm. Reproductive: Probable fiducial clips in the lower uterine segment. The uterus and ovaries otherwise appear normal. Other: Intact anterior abdominal wall. No ascites, hemoperitoneum or pneumoperitoneum. Musculoskeletal: No acute or significant osseous findings. There is a stable chronic superior endplate compression deformity at L1. There is stable multilevel lumbar spondylosis. Stable sclerosis in the sacral ala bilaterally. IMPRESSION: 1. No acute findings or explanation for the patient's symptoms. 2. 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 3. Possible small bladder calculus. 4. Aortic Atherosclerosis (ICD10-I70.0). CT ADDITIONAL VIEWS (CT OF THE RIGHT HIP) Small field-of-view reformatted images through the right hip were obtained. There is no  evidence of acute fracture or dislocation. There are mild right hip degenerative changes. There are stable degenerative changes at the right sacroiliac joint and bilateral sacral ala sclerosis. No significant soft tissue abnormalities in the proximal right thigh. Electronically Signed   By: Richardean Sale M.D.   On: 07/28/2020 14:20   DG HIP UNILAT WITH PELVIS 2-3 VIEWS RIGHT  Result Date: 08/10/2020 CLINICAL DATA:  Right hip pain. EXAM: DG HIP (WITH OR WITHOUT PELVIS) 2-3V RIGHT COMPARISON:  None. FINDINGS: There is no evidence of hip fracture or dislocation. There is no evidence of arthropathy or other focal bone  abnormality. IMPRESSION: Negative. Electronically Signed   By: Dorise Bullion III M.D   On: 08/10/2020 19:42   DG HIP UNILAT WITH PELVIS 2-3 VIEWS RIGHT  Result Date: 08/07/2020 CLINICAL DATA:  Status post fall with subsequent right hip pain. EXAM: DG HIP (WITH OR WITHOUT PELVIS) 2-3V RIGHT COMPARISON:  July 28, 2020 FINDINGS: There is no evidence of an acute hip fracture or dislocation. The mild to moderate severity degenerative changes seen involving both hips. Radiopaque surgical clips are seen overlying the lower pelvis. IMPRESSION: No acute osseous abnormality. Electronically Signed   By: Virgina Norfolk M.D.   On: 08/07/2020 00:58   DG Hip Unilat W or Wo Pelvis 2-3 Views Right  Result Date: 07/28/2020 CLINICAL DATA:  Right hip pain after fall. EXAM: DG HIP (WITH OR WITHOUT PELVIS) 2-3V RIGHT COMPARISON:  None. FINDINGS: There is no evidence of hip fracture or dislocation. There is no evidence of arthropathy or other focal bone abnormality. IMPRESSION: Negative. Electronically Signed   By: Marijo Conception M.D.   On: 07/28/2020 12:46   DG FEMUR 1V RIGHT  Result Date: 08/11/2020 CLINICAL DATA:  Fixation of right femoral fracture. EXAM: RIGHT FEMUR 1 VIEW COMPARISON:  08/10/2020 FINDINGS: Examination demonstrates evidence of patient's right femoral intramedullary nail ridging known distal femoral diametaphyseal fracture. There are proximal and distal anchoring screws. Hardware is intact with anatomic alignment over the fracture site. Exam is otherwise unchanged. IMPRESSION: Fixation of right femoral diametaphyseal fracture with hardware intact and anatomic alignment over the fracture site. Electronically Signed   By: Marin Olp M.D.   On: 08/11/2020 13:48   DG FEMUR, MIN 2 VIEWS RIGHT  Result Date: 08/11/2020 CLINICAL DATA:  Intramedullary nail placement. EXAM: RIGHT FEMUR 2 VIEWS; DG C-ARM 1-60 MIN COMPARISON:  08/10/2020 FINDINGS: Placement of right intramedullary femoral nail with  proximal and distal anchoring screws. Hardware is intact as there is anatomic alignment over patient's distal femoral diametaphyseal fracture. Remainder the exam is unchanged. IMPRESSION: Internal fixation of distal femoral diametaphyseal fracture in anatomic alignment. Hardware intact and in adequate position. Electronically Signed   By: Marin Olp M.D.   On: 08/11/2020 13:45    Lab Data:  CBC: Recent Labs  Lab 08/14/20 0446 08/15/20 1001 08/17/20 0359 08/18/20 0327 08/19/20 0158  WBC 6.0 5.4 7.0 7.2 8.1  HGB 10.5* 11.3* 11.3* 11.4* 11.1*  HCT 32.2* 34.1* 34.7* 35.8* 35.2*  MCV 92.8 93.4 93.3 92.5 93.6  PLT 166 184 205 185 062   Basic Metabolic Panel: Recent Labs  Lab 08/14/20 0446 08/15/20 1001 08/17/20 0359 08/18/20 0327 08/19/20 0158  NA 134* 135 140 137 137  K 3.5 4.1 3.4* 3.8 4.2  CL 99 99 100 100 100  CO2 $Re'26 22 29 30 30  'xfk$ GLUCOSE 186* 214* 246* 228* 250*  BUN $Re'9 13 23 18 17  'rjZ$ CREATININE 0.55 0.60 0.42* 0.37* 0.33*  CALCIUM 9.2 9.3 10.2 9.8 9.9  MG 1.7 2.1  --   --   --    GFR: Estimated Creatinine Clearance: 80.5 mL/min (A) (by C-G formula based on SCr of 0.33 mg/dL (L)). Liver Function Tests: No results for input(s): AST, ALT, ALKPHOS, BILITOT, PROT, ALBUMIN in the last 168 hours. No results for input(s): LIPASE, AMYLASE in the last 168 hours. No results for input(s): AMMONIA in the last 168 hours. Coagulation Profile: Recent Labs  Lab 08/15/20 1001 08/16/20 1647 08/17/20 0359 08/18/20 0327 08/19/20 0158  INR 1.2 1.2 1.1 1.1 1.1   Cardiac Enzymes: No results for input(s): CKTOTAL, CKMB, CKMBINDEX, TROPONINI in the last 168 hours. BNP (last 3 results) No results for input(s): PROBNP in the last 8760 hours. HbA1C: No results for input(s): HGBA1C in the last 72 hours. CBG: Recent Labs  Lab 08/18/20 0636 08/18/20 1157 08/18/20 1621 08/18/20 2131 08/19/20 0714  GLUCAP 212* 350* 237* 184* 230*   Lipid Profile: No results for input(s): CHOL, HDL,  LDLCALC, TRIG, CHOLHDL, LDLDIRECT in the last 72 hours. Thyroid Function Tests: No results for input(s): TSH, T4TOTAL, FREET4, T3FREE, THYROIDAB in the last 72 hours. Anemia Panel: No results for input(s): VITAMINB12, FOLATE, FERRITIN, TIBC, IRON, RETICCTPCT in the last 72 hours. Urine analysis:    Component Value Date/Time   COLORURINE AMBER (A) 08/10/2020 1730   APPEARANCEUR CLOUDY (A) 08/10/2020 1730   LABSPEC 1.028 08/10/2020 1730   PHURINE 5.0 08/10/2020 1730   GLUCOSEU >=500 (A) 08/10/2020 1730   HGBUR LARGE (A) 08/10/2020 1730   BILIRUBINUR NEGATIVE 08/10/2020 1730   KETONESUR 20 (A) 08/10/2020 1730   PROTEINUR NEGATIVE 08/10/2020 1730   UROBILINOGEN 0.2 08/23/2013 1404   NITRITE NEGATIVE 08/10/2020 1730   LEUKOCYTESUR LARGE (A) 08/10/2020 1730     Susan Odom M.D. [facility] 08/19/2020, 11:22 AM   Call night coverage person covering after 7pm

## 2020-08-19 NOTE — Plan of Care (Signed)

## 2020-08-20 ENCOUNTER — Other Ambulatory Visit: Payer: Self-pay | Admitting: Radiation Therapy

## 2020-08-20 ENCOUNTER — Inpatient Hospital Stay: Payer: Medicare Other | Attending: Neurological Surgery

## 2020-08-20 DIAGNOSIS — I2510 Atherosclerotic heart disease of native coronary artery without angina pectoris: Secondary | ICD-10-CM | POA: Diagnosis not present

## 2020-08-20 DIAGNOSIS — I10 Essential (primary) hypertension: Secondary | ICD-10-CM | POA: Diagnosis not present

## 2020-08-20 DIAGNOSIS — T796XXD Traumatic ischemia of muscle, subsequent encounter: Secondary | ICD-10-CM | POA: Diagnosis not present

## 2020-08-20 DIAGNOSIS — E1140 Type 2 diabetes mellitus with diabetic neuropathy, unspecified: Secondary | ICD-10-CM | POA: Diagnosis not present

## 2020-08-20 LAB — GLUCOSE, CAPILLARY
Glucose-Capillary: 165 mg/dL — ABNORMAL HIGH (ref 70–99)
Glucose-Capillary: 345 mg/dL — ABNORMAL HIGH (ref 70–99)
Glucose-Capillary: 221 mg/dL — ABNORMAL HIGH (ref 70–99)
Glucose-Capillary: 264 mg/dL — ABNORMAL HIGH (ref 70–99)

## 2020-08-20 LAB — PROTIME-INR
INR: 1.1 (ref 0.8–1.2)
Prothrombin Time: 14.2 seconds (ref 11.4–15.2)

## 2020-08-20 LAB — SARS CORONAVIRUS 2 (TAT 6-24 HRS): SARS Coronavirus 2: NEGATIVE

## 2020-08-20 NOTE — Progress Notes (Signed)
Providing Compassionate, Quality Care - Together   Neurosurgery Service Progress Note  Subjective: No acute events overnight. No new complaints this morning  Objective: Vitals:   08/19/20 1300 08/19/20 1927 08/20/20 0350 08/20/20 0731  BP: (!) 152/70 129/69 (!) 147/66 139/72  Pulse: 70 73 73 72  Resp: 16 16 15 17   Temp: 97.6 F (36.4 C) 97.8 F (36.6 C) 97.7 F (36.5 C) 98.6 F (37 C)  TempSrc: Oral Oral Oral Oral  SpO2: 94% 94% 92% 95%  Weight:      Height:       Temp (24hrs), Avg:97.9 F (36.6 C), Min:97.6 F (36.4 C), Max:98.6 F (37 C)  CBC Latest Ref Rng & Units 08/19/2020 08/18/2020 08/17/2020  WBC 4.0 - 10.5 K/uL 8.1 7.2 7.0  Hemoglobin 12.0 - 15.0 g/dL 11.1(L) 11.4(L) 11.3(L)  Hematocrit 36 - 46 % 35.2(L) 35.8(L) 34.7(L)  Platelets 150 - 400 K/uL 167 185 205   BMP Latest Ref Rng & Units 08/19/2020 08/18/2020 08/17/2020  Glucose 70 - 99 mg/dL 250(H) 228(H) 246(H)  BUN 8 - 23 mg/dL 17 18 23   Creatinine 0.44 - 1.00 mg/dL 0.33(L) 0.37(L) 0.42(L)  Sodium 135 - 145 mmol/L 137 137 140  Potassium 3.5 - 5.1 mmol/L 4.2 3.8 3.4(L)  Chloride 98 - 111 mmol/L 100 100 100  CO2 22 - 32 mmol/L 30 30 29   Calcium 8.9 - 10.3 mg/dL 9.9 9.8 10.2    Intake/Output Summary (Last 24 hours) at 08/20/2020 1017 Last data filed at 08/20/2020 0400 Gross per 24 hour  Intake 240 ml  Output 400 ml  Net -160 ml    Current Facility-Administered Medications:    0.9 %  sodium chloride infusion, , Intravenous, Continuous, Rai, Ripudeep K, MD, Stopped at 08/16/20 1002   amLODipine (NORVASC) tablet 10 mg, 10 mg, Oral, Daily, Rai, Ripudeep K, MD, 10 mg at 08/20/20 3009   Chlorhexidine Gluconate Cloth 2 % PADS 6 each, 6 each, Topical, Daily, Rai, Ripudeep K, MD, 6 each at 08/20/20 0939   [COMPLETED] dexamethasone (DECADRON) injection 6 mg, 6 mg, Intravenous, Q6H, 6 mg at 08/15/20 1147 **FOLLOWED BY** [COMPLETED] dexamethasone (DECADRON) injection 4 mg, 4 mg, Intravenous, Q6H, 4 mg at 08/16/20  1236 **FOLLOWED BY** dexamethasone (DECADRON) injection 4 mg, 4 mg, Intravenous, Q8H, Rai, Ripudeep K, MD, 4 mg at 08/20/20 0609   docusate sodium (COLACE) capsule 100 mg, 100 mg, Oral, BID, Rai, Ripudeep K, MD, 100 mg at 08/20/20 0939   feeding supplement (ENSURE ENLIVE) (ENSURE ENLIVE) liquid 237 mL, 237 mL, Oral, BID BM, Rai, Ripudeep K, MD, 237 mL at 08/20/20 0942   hydrALAZINE (APRESOLINE) injection 10 mg, 10 mg, Intravenous, Q6H PRN, Rai, Ripudeep K, MD   insulin aspart (novoLOG) injection 0-15 Units, 0-15 Units, Subcutaneous, TID WC, Rai, Ripudeep K, MD, 3 Units at 08/20/20 0721   insulin aspart (novoLOG) injection 0-5 Units, 0-5 Units, Subcutaneous, QHS, Rai, Ripudeep K, MD, 4 Units at 08/17/20 2113   insulin aspart (novoLOG) injection 5 Units, 5 Units, Subcutaneous, TID WC, Rai, Ripudeep K, MD, 5 Units at 08/20/20 0740   insulin detemir (LEVEMIR) injection 25 Units, 25 Units, Subcutaneous, QHS, Rai, Ripudeep K, MD, 25 Units at 08/19/20 2122   levETIRAcetam (KEPPRA) tablet 500 mg, 500 mg, Oral, BID, Esmond Plants, RPH, 500 mg at 08/20/20 2330   losartan (COZAAR) tablet 100 mg, 100 mg, Oral, Daily, Rai, Ripudeep K, MD, 100 mg at 08/20/20 0762   MEDLINE mouth rinse, 15 mL, Mouth Rinse, BID, Rai,  Ripudeep K, MD, 15 mL at 08/20/20 0942   menthol-cetylpyridinium (CEPACOL) lozenge 3 mg, 1 lozenge, Oral, PRN **OR** phenol (CHLORASEPTIC) mouth spray 1 spray, 1 spray, Mouth/Throat, PRN, Rai, Ripudeep K, MD   metoCLOPramide (REGLAN) tablet 5-10 mg, 5-10 mg, Oral, Q8H PRN **OR** metoCLOPramide (REGLAN) injection 5-10 mg, 5-10 mg, Intravenous, Q8H PRN, Rai, Ripudeep K, MD   multivitamin with minerals tablet 1 tablet, 1 tablet, Oral, Daily, Rai, Ripudeep K, MD, 1 tablet at 08/20/20 0939   ondansetron (ZOFRAN) tablet 4 mg, 4 mg, Oral, Q6H PRN **OR** ondansetron (ZOFRAN) injection 4 mg, 4 mg, Intravenous, Q6H PRN, Rai, Ripudeep K, MD   oxyCODONE (Oxy IR/ROXICODONE) immediate release tablet 5  mg, 5 mg, Oral, Q4H PRN, Rai, Ripudeep K, MD, 5 mg at 08/19/20 2120   Physical Exam: AOx3, PERRL, speech fluent, CNS grossly intact, follow commands,moving all extremities equally, face is symmetric, tongue midline Incision c/d/i   Assessment & Plan: 74 y.o. female with PMH for essential hypertension,diabets, obesity, presented to ED on 08/10/2020 for right knee pain and gradual onset of confusion. CT head revealed lobular masslike lesion centered predominantly along the corpus callosum, interventricular septum and fornix with surrounding vasogenic edema which crossed the genu of corpus callosum and is also present the left frontal white matter. 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  -. Dr. Mickeal Skinner spoke with daughter and patient on Friday about the course of action going forward -continue decadron 4 mg q8h, will taper off upon discharge (3 days BID, followed by 4mg  daily) -continue PT/OT while in inpatient -continue antiepileptics  -continue SCDs -glioblastoma multiforme, WHO grade IV   Osie Cheeks 08/20/20 10:17 AM

## 2020-08-20 NOTE — Progress Notes (Signed)
Physical Therapy Treatment Patient Details Name: Susan Odom MRN: [id number] DOB: [birth year] Today's Date: 08/20/2020    History of Present Illness Susan Odom is a 74 y.o. female with medical history significant of hypertension, diabetes, obesity presented to ER for evaluation of right knee pain ongoing for 2 weeks leading up to this admission; she went to [facility] ED on 9/14 with back pain.  Had MRI done-which shows degenerative changes.  UA was concerning for UTI therefore she discharged home on Keflex, Percocet and prednisone and recommended outpatient neurosurgery follow-up.  Was seen by neurosurgery  morning of 9/17, due to worsening back pain and difficulty walking since 2 weeks.  At the office: She was not able to stand or bear weight, was extremely in pain,  not able to obtain radiographs therefore she was sent to ER for further evaluation and management.  Daughter reports gradual onset of confusion & cognitive decline recently.  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    PT Comments    Pt supine on arrival, mildly confused (A&O x 2-3) and agreeable to therapy session, with good participation and tolerance for session. Pt slow to process cues, needing increased time to initiate and perform mobility tasks, and continues to benefit from +2 assist for mobility tasks (able to transition to EOB with heavy +1 assist this session and increased time/cues), pt continues to demonstrate poor seated balance and posterior lean in siting, needing maxA for trunk support, tolerated sitting ~5 minutes total prior to return to supine. Pt performed supine BLE AAROM therapeutic exercises with good tolerance, needing max cues and increased time to perform as detailed below, limited proximal strength  observed bilaterally. Pt continues to benefit from PT services to progress toward functional mobility goals. Continue to recommend SNF level of rehab upon discharge.   Follow Up Recommendations  SNF;Other (comment)     Equipment Recommendations  Wheelchair (measurements PT);Wheelchair cushion (measurements PT);Hospital bed;Other (comment)    Recommendations for Other Services OT consult     Precautions / Restrictions Precautions Precautions: Fall Precaution Comments: has CPM Required Braces or Orthoses: Knee Immobilizer - Right Knee Immobilizer - Right: On when out of bed or walking Restrictions Weight Bearing Restrictions: Yes RLE Weight Bearing: Touchdown weight bearing    Mobility  Bed Mobility Overal bed mobility: Needs Assistance Bed Mobility: Supine to Sit;Sit to Supine Rolling: Max assist   Supine to sit: Max assist;+2 for physical assistance Sit to supine: Total assist;+2 for physical assistance   General bed mobility comments: pt needs hand over hand guidance to use bed rail for rolling, able to assist with BLE movement toward EOB with multimodal cues, maxA for trunk rise and to maintain upright posture with BUE support at EOB  Transfers  General transfer comment: defered lateral scoot transfer as pt unable to maintain sitting EOB or assist with scooting. Asked RN staff to use maxi-sky to transfer her OOB to chair   Wheelchair Mobility    Modified Rankin (Stroke Patients Only)       Balance Overall balance assessment: Needs assistance Sitting-balance support: Bilateral upper extremity supported Sitting balance-Leahy Scale: Poor Sitting balance - Comments: required maxA majority of time with a couple of episodes of modA however pt with posterior lean t/o; unsafe to assess standing 2/2 poor  seated balance, slow/ limited command following and PWB restriction Postural control: Posterior lean         Cognition Arousal/Alertness: Awake/alert Behavior During  Therapy: Flat affect Overall Cognitive Status: No family/caregiver present to determine baseline cognitive functioning Area of Impairment: Orientation;Following commands;Problem solving;Attention;Awareness                 Orientation Level: Situation;Time;Disoriented to (oriented to self/DOB, "hospital", city, fall (not to injury))     Following Commands: Follows one step commands inconsistently     Problem Solving: Slow processing;Difficulty sequencing;Decreased initiation;Requires verbal cues;Requires tactile cues General Comments: less anxious this session at rest, but increased anxiety/pain with EOB sitting, able to follow ~70% of commands this session      Exercises General Exercises - Upper Extremity Shoulder Flexion: AAROM;15 reps;Supine;Both Elbow Flexion: AROM;10 reps;Supine;Both Other Exercises Other Exercises: Supine BLE AAROM: ankle pumps, heel slides, hip abduction, glute sets 1x10 reps ea    General Comments General comments (skin integrity, edema, etc.): pt left supine rolled partially to L side with pillow behind R back to offload pressure at end of session      Pertinent Vitals/Pain Pain Assessment: Faces Faces Pain Scale: Hurts even more Pain Location: seems to increase with RLE ROM, pt unable to verbalize location of pain Pain Descriptors / Indicators: Grimacing;Guarding;Moaning;Operative site guarding Pain Intervention(s): Limited activity within patient's tolerance;Monitored during session;Repositioned (RN notified pt increased pain with mobility)           PT Goals (current goals can now be found in the care plan section) Acute Rehab PT Goals Patient Stated Goal: To go to rehab and get stronger then to go home. PT Goal Formulation: With patient Time For Goal Achievement: 08/26/20 Potential to Achieve Goals: Fair Progress towards PT goals: Progressing toward goals    Frequency    Min 2X/week      PT Plan Current plan remains appropriate        AM-PAC PT "6 Clicks" Mobility   Outcome Measure  Help needed turning from your back to your side while in a flat bed without using bedrails?: Total Help needed moving from lying on your back to sitting on the side of a flat bed without using bedrails?: Total Help needed moving to and from a bed to a chair (including a wheelchair)?: Total Help needed standing up from a chair using your arms (e.g., wheelchair or bedside chair)?: Total Help needed to walk in hospital room?: Total Help needed climbing 3-5 steps with a railing? : Total 6 Click Score: 6    End of Session Equipment Utilized During Treatment: Other (comment) (transfer pad) Activity Tolerance: Patient limited by fatigue;Other (comment);Patient limited by pain Patient left: in bed;with call bell/phone within reach (CNA entering room for hygiene assist, CNA to place alarm)   PT Visit Diagnosis: Other abnormalities of gait and mobility (R26.89);Pain Pain - Right/Left: Right Pain - part of body: Leg     Time: 1027-1106 PT Time Calculation (min) (ACUTE ONLY): 39 min  Charges:  $Therapeutic Exercise: 8-22 mins $Therapeutic Activity: 23-37 mins                     Christopherjohn Schiele P., PTA Acute Rehabilitation Services Pager: [phone] Office: [place] 08/20/2020, 11:37 AM

## 2020-08-20 NOTE — TOC Progression Note (Addendum)
Transition of Care [facility]) - Progression Note    Patient Details  Name: Susan Odom MRN: [id number] Date of Birth: 09/05/1946  Transition of Care [facility]) CM/SW [place], [place] Phone Number: 08/20/2020, 10:28 AM  Clinical Narrative:     Spoke with pt about bed offers. She is quiet when speaking but appears to acknowledge bed offers and is okay with CSW reaching out to her daughter  Susan Odom and left message with daughter Susan Odom   1110: Spoke with pt daughter by phone and explained bed offers. Daughter will review and discuss further with her mother. Daughter explains if [facility] can't accept she has a preference for SNF's in [place]   Expected Discharge Plan: [place] Barriers to Discharge: Continued Medical Work up, SNF Pending bed offer  Expected Discharge Plan and Services Expected Discharge Plan: [place]: [place] arrangements for the past 2 months: Single Family Home                                       Social Determinants of Health (SDOH) Interventions    Readmission Risk Interventions No flowsheet data found.

## 2020-08-20 NOTE — Progress Notes (Signed)
[facility]                                                                              Patient Demographics  Susan Odom, is a 74 y.o. female, DOB - 05-Dec-1945, SHF:[id number]  Admit date - 08/10/2020   Admitting Physician Meredith Pel, MD  Outpatient Primary MD for the patient is Redmond School, MD  Outpatient specialists:   LOS - 10  days   Medical records reviewed and are as summarized below:    Chief Complaint  Patient presents with  . Leg Pain       Brief summary   Susan Odom is a 74 year old female with past medical history notable for essential hypertension, diabetes, obesity, presented to ED with right knee pain for 2 weeks, progressive over the past 24 hours prior to admission.  Associated with swelling.  Patient reported a fall few days prior.  Additionally, daughter reported gradual onset of confusion and cognitive decline recently. Patient initially went to [facility], ED on 08/07/2020 with back pain, MRI notable for degenerative changes. UA concerning for UTI and was discharged home on Keflex, Percocet and prednisone.  Was seen by neurosurgery with worsening back pain and difficulty walking for 2 weeks.  She was unable to stand or bear weight in the office; and subsequently was sent to the emergency department for further evaluation and management.  In the ED, WBC count 14.0, sodium 134, ESR within normal limits, CRP 5.4, Covid-19 PCR negative.  X-ray right hip notable for severe displaced and comminuted oblique fracture distal right femur.  Chest x-ray with no acute findings.  CT right knee with mildly comminuted and slightly displaced fracture distal femoral metadiaphysis.  CT head with lobular masslike lesion predominantly along the corpus callosum with likely surrounding vasogenic edema concerning for intracranial neoplasm such as lymphoma or glioblastoma multiform a.  Neurosurgery and orthopedics were consulted.    Patient was admitted  for further work-up.  Assessment & Plan    Principal Problem:  Right femur fracture - Patient presented with right lower extremity pain and difficulty ambulating - Patient underwent ORIF with intramedullary retrograde femoral nailing on 9/18 by Dr. Marlou Sa - Per orthopedics, [facility] RLE for transfers only x2 weeks, then weightbearing as tolerated per orthopedics -Continue pain control, aspirin 81 mg daily for DVT prophylaxis, orthopedics following.  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 -Underwent left frontal stereotactic brain biopsy on 9/21, frozen section diagnosis high-grade glial tumor -Continue Decadron, Keppra.   -For discharge, continue Decadron 4 g twice daily for 3 days followed by 4 g daily, further taper outpatient.  Continue Keppra -Appreciate Dr. Renda Rolls recommendations, discussed treatment options and prognosis with the patient and  her family -Currently stable, no acute complaints, much more alert and oriented today  Urinary tract infection -UA positive for UTI, urine culture showed multiple species -Completed 3-day course of IV Rocephin on 9/20  Hypokalemia Resolved  Essential hypertension -Continue amlodipine, losartan, hydralazine IV as needed with parameters  Type 2 diabetes mellitus On Invokana outpatient, will hold while inpatient. -CBGs remain uncontrolled due to steroids, hopefully  will improve with taper -Increase Levemir to 25 units qhs, NovoLog 5 units 3 times daily AC, continue sliding scale insulin   Leukocytosis Patient afebrile with a WBC count of 14.0 on admission.    Etiology likely due to steroids, UTI  -Resolved  ?  Aspiration SLP evaluation recommended dysphagia 3 diet  Obesity Estimated body mass index is 30.93 kg/m as calculated from the following:   Height as of this encounter: 5' 11" (1.803 m).   Weight as of this encounter: 100.6 kg.  Code Status: DNR DVT Prophylaxis:   SCD's Family Communication: Discussed all imaging results, lab results, explained to the patient   Disposition Plan:     Status is: Inpatient  Remains inpatient appropriate because:Inpatient level of care appropriate due to severity of illness   Dispo:  Patient From: Home  Planned Disposition: [place]  Expected discharge date: 08/20/2020  Medically stable for discharge: Pending skilled nursing facility when bed available  Time Spent in minutes 25 minutes  Procedures:  Left frontal stereotactic brain biopsy  Consultants:   Neurosurgery Neuro-oncology Orthopedics Palliative medicine  Antimicrobials:   Anti-infectives (From admission, onward)   Start     Dose/Rate Route Frequency Ordered Stop   08/14/20 1500  ceFAZolin (ANCEF) IVPB 2g/100 mL premix        2 g 200 mL/hr over 30 Minutes Intravenous  Once 08/14/20 1456 08/14/20 1528   08/14/20 1457  ceFAZolin (ANCEF) 2-4 GM/100ML-% IVPB       Note to Pharmacy: Grace Blight   : cabinet override      08/14/20 1457 08/14/20 1547   08/11/20 1600  ceFAZolin (ANCEF) IVPB 2g/100 mL premix        2 g 200 mL/hr over 30 Minutes Intravenous Every 6 hours 08/11/20 1436 08/11/20 2301   08/11/20 1430  cefTRIAXone (ROCEPHIN) 1 g in sodium chloride 0.9 % 100 mL IVPB        1 g 200 mL/hr over 30 Minutes Intravenous Every 24 hours 08/10/20 1758 08/13/20 1413   08/11/20 1300  cefTRIAXone (ROCEPHIN) 1 g in  sodium chloride 0.9 % 100 mL IVPB  Status:  Discontinued        1 g 200 mL/hr over 30 Minutes Intravenous  Once 08/11/20 1258 08/11/20 1425   08/11/20 1100  vancomycin (VANCOCIN) powder  Status:  Discontinued          As needed 08/11/20 1100 08/11/20 1232   08/11/20 0910  ceFAZolin (ANCEF) IVPB 2g/100 mL premix        2 g 200 mL/hr over 30 Minutes Intravenous To [facility] 08/10/20 2227 08/11/20 0930   08/10/20 1430  cefTRIAXone (ROCEPHIN) 1 g in sodium chloride 0.9 % 100 mL IVPB        1 g 200 mL/hr over 30 Minutes Intravenous  Once 08/10/20 1417 08/10/20 1501         Medications  Scheduled Meds: . amLODipine  10 mg Oral Daily  . Chlorhexidine Gluconate Cloth  6 each Topical Daily  . dexamethasone (DECADRON) injection  4 mg Intravenous Q8H  . docusate  sodium  100 mg Oral BID  . feeding supplement (ENSURE ENLIVE)  237 mL Oral BID BM  . insulin aspart  0-15 Units Subcutaneous TID WC  . insulin aspart  0-5 Units Subcutaneous QHS  . insulin aspart  5 Units Subcutaneous TID WC  . insulin detemir  25 Units Subcutaneous QHS  . levETIRAcetam  500 mg Oral BID  . losartan  100 mg Oral Daily  . mouth rinse  15 mL Mouth Rinse BID  . multivitamin with minerals  1 tablet Oral Daily   Continuous Infusions: . sodium chloride Stopped (08/16/20 1002)   PRN Meds:.hydrALAZINE, menthol-cetylpyridinium **OR** phenol, metoCLOPramide **OR** metoCLOPramide (REGLAN) injection, ondansetron **OR** ondansetron (ZOFRAN) IV, oxyCODONE      Subjective:   Dilia Alemany was seen and examined today.  Oriented x3, much more alert and awake, eating by herself.  No acute issues overnight.  No headache, nausea vomiting or abdominal pain.  No fevers.   Objective:   Vitals:   08/19/20 1300 08/19/20 1927 08/20/20 0350 08/20/20 0731  BP: (!) 152/70 129/69 (!) 147/66 139/72  Pulse: 70 73 73 72  Resp: _0 Temp: 97.6 F (36.4 C) 97.8 F (36.6 C) 97.7 F (36.5 C) 98.6 F (37 C)    TempSrc: Oral Oral Oral Oral  SpO2: 94% 94% 92% 95%  Weight:      Height:        Intake/Output Summary (Last 24 hours) at 08/20/2020 1330 Last data filed at 08/20/2020 1100 Gross per 24 hour  Intake 240 ml  Output 402 ml  Net -162 ml     Wt Readings from Last 3 Encounters:  08/19/20 100.6 kg  08/06/20 99.8 kg  07/28/20 102.1 kg   Physical Exam  General: Alert and oriented x 3, NAD  Cardiovascular: S1 S2 clear, RRR. No pedal edema b/l  Respiratory: CTAB, no wheezing, rales or rhonchi  Gastrointestinal: Soft, nontender, nondistended, NBS  Ext: no pedal edema bilaterally  Neuro: no new deficits  Musculoskeletal: No cyanosis, clubbing  Skin: No rashes  Psych: Normal affect and demeanor, alert and oriented x3     Data Reviewed:  I have personally reviewed following labs and imaging studies  Micro Results Recent Results (from the past 240 hour(s))  SARS Coronavirus 2 by RT PCR (hospital order, performed in [place] hospital lab) Nasopharyngeal Nasopharyngeal Swab     Status: None   Collection Time: 08/10/20  2:33 PM   Specimen: Nasopharyngeal Swab  Result Value Ref Range Status   SARS Coronavirus 2 NEGATIVE NEGATIVE Final    Comment: (NOTE) SARS-CoV-2 target nucleic acids are NOT DETECTED.  The SARS-CoV-2 RNA is generally detectable in upper and lower respiratory specimens during the acute phase of infection. The lowest concentration of SARS-CoV-2 viral copies this assay can detect is 250 copies / mL. A negative result does not preclude SARS-CoV-2 infection and should not be used as the sole basis for treatment or other patient management decisions.  A negative result may occur with improper specimen collection / handling, submission of specimen other than nasopharyngeal swab, presence of viral mutation(s) within the areas targeted by this assay, and inadequate number of viral copies (<250 copies / mL). A negative result must be combined with  clinical observations, patient history, and epidemiological information.  Fact Sheet for Patients:   StrictlyIdeas.no  Fact Sheet for Healthcare Providers: BankingDealers.co.za  This test is not yet approved or  cleared by the [place] FDA and has been authorized  for detection and/or diagnosis of SARS-CoV-2 by FDA under an Emergency Use Authorization (EUA).  This EUA will remain in effect (meaning this test can be used) for the duration of the COVID-19 declaration under Section 564(b)(1) of the Act, 21 U.S.C. section 360bbb-3(b)(1), unless the authorization is terminated or revoked sooner.  Performed at [facility], [place] [address] [place], [place] [address]   MRSA PCR Screening     Status: None   Collection Time: 08/11/20  5:28 AM   Specimen: Nasal Mucosa; Nasopharyngeal  Result Value Ref Range Status   MRSA by PCR NEGATIVE NEGATIVE Final    Comment:        The GeneXpert MRSA Assay (FDA approved for NASAL specimens only), is one component of a comprehensive MRSA colonization surveillance program. It is not intended to diagnose MRSA infection nor to guide or monitor treatment for MRSA infections. Performed at [facility], [place] [address], [place], [place] [address]     Radiology Reports DG Chest 1 View  Result Date: 07/28/2020 CLINICAL DATA:  Fall last night. EXAM: CHEST  1 VIEW COMPARISON:  August 22, 2013. FINDINGS: Stable cardiomediastinal silhouette. Mild bibasilar subsegmental atelectasis is noted. No pneumothorax or pleural effusion is noted. Bony thorax is unremarkable. IMPRESSION: Mild bibasilar subsegmental atelectasis. Electronically Signed   By: Marijo Conception M.D.   On: 07/28/2020 12:47   DG Lumbar Spine Complete  Result Date: 07/28/2020 CLINICAL DATA:  Lower back pain after fall last night. EXAM: LUMBAR SPINE - COMPLETE 4+ VIEW COMPARISON:  None. FINDINGS: No fracture or significant  spondylolisthesis is noted. Moderate to severe degenerative disc disease is noted at all levels of the lumbar spine. 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 No acute abnormality seen in the lumbar spine. Electronically Signed   By: Marijo Conception M.D.   On: 07/28/2020 12:49   DG Knee 1-2 Views Right  Result Date: 08/10/2020 CLINICAL DATA:  Right knee pain after multiple falls. EXAM: RIGHT KNEE - 1-2 VIEW COMPARISON:  None. FINDINGS: Severely displaced and comminuted oblique fracture is seen involving the distal right femur. There is slight overriding of the fracture fragments. Degenerative changes are seen involving the right knee. IMPRESSION: Severely displaced and comminuted oblique fracture of distal right femur. Electronically Signed   By: Marijo Conception M.D.   On: 08/10/2020 13:24   DG Ankle Complete Left  Result Date: 08/10/2020 CLINICAL DATA:  Pain EXAM: LEFT ANKLE COMPLETE - 3+ VIEW COMPARISON:  None. FINDINGS: Bilateral soft tissue swelling.  No fracture or dislocation. IMPRESSION: Bilateral soft tissue swelling without fracture dislocation. Electronically Signed   By: Dorise Bullion III M.D   On: 08/10/2020 19:48   DG Ankle Complete Right  Result Date: 08/11/2020 CLINICAL DATA:  Right hip pain. EXAM: RIGHT ANKLE - COMPLETE 3+ VIEW COMPARISON:  None. FINDINGS: Significant lateral soft tissue swelling identified. No displaced fractures are identified. There is a subtle lucency in the distal fibula medially which is favored to represent a prominent nutrient foramen. This lucency does not extend through the shaft of the distal fibula. The first lateral view was limited as the patient had a dressing noted posteriorly partially obscuring the posterior distal fibula. A repeat film was then obtained without the dressing. No definitive fractures are seen on today's study. Degenerative changes are seen in the mid and hindfoot. IMPRESSION: No convincing evidence of  fracture. Degenerative changes in the mid and hindfoot. Electronically Signed   By: Shanon Brow  Jimmye Norman III M.D   On: 08/11/2020 09:50   CT HEAD WO CONTRAST  Result Date: 08/15/2020 CLINICAL DATA:  Intracranial mass EXAM: CT HEAD WITHOUT CONTRAST TECHNIQUE: Contiguous axial images were obtained from the base of the skull through the vertex without intravenous contrast. COMPARISON:  08/10/2020 FINDINGS: Brain: The nodular mass involving the septum pellucidum and extending into the genu of the corpus callosum anteriorly as well as the body and isthmus of the corpus callosum posteriorly, asymmetrically more invasive on the left, is again seen. Since the prior examination, there has developed punctate foci of gas as well as acute hemorrhage within the dominant, left colossal and deep white matter portion of the mass in keeping with stereotactic brain biopsy. A tiny burr hole is seen within the left parietal region and a tiny focus of hemorrhage is seen along the needle tract. The lobular hemorrhage within the mass extends into the left lateral ventricle with minimal layering hemorrhage and blood clot within the occipital horn. No significant mass effect noted. Minimal pneumocephalus within the left lateral ventricle related to biopsy. Ventricular size is normal.  Cerebellum unremarkable. Vascular: Unremarkable Skull: Otherwise intact Sinuses/Orbits: Paranasal sinuses are clear. Orbits are unremarkable. Other: Mastoid air cells and middle ear cavities are clear. IMPRESSION: Interval stereotactic biopsy of the left colossal/deep white matter portion of the infiltrative mass with mild lobular hemorrhage in the region of biopsy with slight intraventricular extension. No abnormal mass effect. Normal ventricular size. Electronically Signed   By: Fidela Salisbury MD   On: 08/15/2020 07:23   CT Head Wo Contrast  Result Date: 08/10/2020 CLINICAL DATA:  Mental status change, history of falls EXAM: CT HEAD WITHOUT CONTRAST  TECHNIQUE: Contiguous axial images were obtained from the base of the skull through the vertex without intravenous contrast. COMPARISON:  CT 10/17/2013 FINDINGS: Brain: Ill-defined slightly hyperattenuating lobular masslike lesions about the corpus callosum,, interventricular septum and fornix with some hypoattenuating, likely vasogenic edema crossing genu of the corpus callosum and deep white matter adjacent the left lateral ventricle as well. Overall, incompletely characterized on the CT imaging. No areas of hyperdense hemorrhage. Some local mass effect and partial effacement of the lateral ventricles is noted. No extra-axial collection or hemorrhage is evident. Chronic mild expansion of the retro cerebellar CSF space likely reflecting arachnoid cyst versus mega cisterna magna. Symmetric prominence of the ventricles, cisterns and sulci compatible with parenchymal volume loss. Chronic small vessel ischemic changes are similar to priors. Vascular: Atherosclerotic calcification of the carotid siphons and intradural vertebral arteries. No hyperdense vessel. Skull: No calvarial fracture or suspicious osseous lesion. No scalp swelling or hematoma. Sinuses/Orbits: Paranasal sinuses and mastoid air cells are predominantly clear. Included orbital structures are unremarkable. Other: None IMPRESSION: Lobular masslike lesion centered predominantly along the corpus callosum, interventricular septum and fornix with likely surrounding vasogenic edema which crosses the genu of the corpus callosum and is also present the left frontal white matter. Primary differential considerations would include a intracranial neoplasm such as CNS lymphoma or glioblastoma multiforme. Overall, incompletely characterized on this unenhanced head CT. Recommend further evaluation with MRI with contrast. No other acute intracranial abnormality. Background of some mild chronic parenchymal volume loss and microvascular angiopathy changes with  intracranial atherosclerosis. These results were called by telephone at the time of interpretation on 08/10/2020 at 4:01 pm to provider [facility] , who verbally acknowledged these results. Electronically Signed   By: Lovena Le M.D.   On: 08/10/2020 16:01   CT Knee Right Wo Contrast  Result Date: 08/10/2020 CLINICAL DATA:  Frequent falls.  Distal femur fracture. EXAM: CT OF THE RIGHT KNEE WITHOUT CONTRAST TECHNIQUE: Multidetector CT imaging of the right knee was performed according to the standard protocol. Multiplanar CT image reconstructions were also generated. COMPARISON:  Radiographs same date. FINDINGS: Bones/Joint/Cartilage Oblique fracture of the distal femoral metadiaphysis is again noted with mild comminution. This fracture is associated with up to 4.3 cm of medial displacement as well as significant overriding of the fracture fragments. There is no intra-articular extension of the fracture. The patella, proximal tibia and proximal fibula are intact. There are advanced tricompartmental degenerative changes at the knee with large osteophytes and intra-articular loose bodies. There is a small joint effusion. Ligaments Suboptimally assessed by CT. The anterior cruciate ligament is not well visualized. Muscles and Tendons The extensor mechanism is intact. Mild generalized muscular atrophy. Soft tissues Small hematoma adjacent to the fracture. No foreign body, soft tissue emphysema or bone destruction. IMPRESSION: 1. Mildly comminuted and significantly displaced fracture of the distal femoral metadiaphysis as described. No intra-articular extension of the fracture. 2. 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 Electronically Signed   By: Richardean Sale M.D.   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 Electronically Signed   By: Kellie Simmering DO   On: 08/13/2020 22:04   Result Date: 08/13/2020 CLINICAL DATA:  Provided history: Brain mass or lesion; brain lab protocol for surgery 921. EXAM: MRI HEAD WITH CONTRAST TECHNIQUE: Multiplanar, multiecho pulse sequences of the brain and surrounding structures were obtained with intravenous contrast. CONTRAST:  84m GADAVIST GADOBUTROL 1 MMOL/ML IV SOLN COMPARISON:  Brain MRI with and without contrast 08/10/2020, head CT 08/10/2020. FINDINGS: Brain: Unchanged appearance of lobulated and infiltrative masslike T2/FLAIR hyperintense signal abnormality expanding the body of the corpus callosum with nodular extension along the septum pellucidum and contiguous spread along the margins of the frontal horns of both lateral ventricles. As before, the lesion crosses midline via the body of the corpus callosum with centrum semiovale involvement (greater on the left) and frontal horn involvement (greater on the right). Also unchanged, there is corresponding heterogeneous contrast enhancement throughout the affected areas. Redemonstrated small areas of discontinuous petechial enhancement, most notably within the callosal splenium. Unchanged suspicious abnormal leptomeningeal enhancement with both internal auditory canals and questionable abnormal leptomeningeal enhancement along the ventral brainstem. Background mild scattered T2/FLAIR hyperintensity within the cerebral white matter and brainstem is nonspecific, but most commonly seen on the basis of chronic small vessel ischemia. Vascular: Expected enhancement within the proximal large arterial vessels and dural venous sinuses. Skull and upper cervical spine: No suspicious focal marrow lesion is identified on the acquired sequences. Sinuses/Orbits: No  appreciable acute orbital abnormality on the acquired sequences. Mild paranasal sinus mucosal thickening, most notably ethmoidal. Trace fluid within left mastoid air cells. IMPRESSION: Central callosal, septal and periventricular infiltrative an enhancing tumor most suspicious for glioblastoma/high-grade glioma, unchanged as compared to 08/10/2020. Lymphoma can have a similar imaging appearance, but is considered less likely. 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 Electronically Signed: By: KKellie SimmeringDO On: 08/13/2020 21:56  MR BRAIN W WO CONTRAST  Result Date: 08/10/2020 CLINICAL DATA:  74 year old female with mental status changes, falls. Abnormal noncontrast head today suggesting a pericallosal mass. EXAM: MRI HEAD WITHOUT AND WITH CONTRAST TECHNIQUE: Multiplanar, multiecho pulse sequences of the brain and surrounding structures were obtained without and with intravenous contrast. CONTRAST:  102m GADAVIST GADOBUTROL 1 MMOL/ML IV SOLN COMPARISON:  Head CT earlier today. Prior head CTs 10/17/2013 and earlier. FINDINGS: Brain: Lobulated, infiltrative masslike T2 and FLAIR hyperintense lesion expanding the body of the corpus callosum, with nodular extension along the septum pellucidum, and contiguous spread in and around the frontal horns of both lateral ventricles. The lesion crosses midline via the body of the corpus callosum, with left predominant centrum semiovale but right predominant frontal horn regional involvement. Much of the lesion shows mildly to moderately restricted diffusion but relative T2 hyperintensity. There is petechial hemorrhage in the central colo cell and pericallosal white matter portion on SWI (series 12, image 35). Following contrast there is heterogeneous enhancement throughout the affected areas. And there are occasional small discontinuous areas of petechial enhancement  (most notably in the splenium of the corpus callosum on series 20, image 13. All told, the lesion encompasses roughly 69 x 51 x 36 mm (AP by transverse by CC). Despite the fairly extensive periventricular configuration, no disseminated ependymal enhancement is identified. However, there is suspicious abnormal leptomeningeal enhancement in both internal auditory canals right greater than left (series 18, image 14). And there is also questionable abnormally increased leptomeningeal enhancement along the ventral brainstem (series 20, image 13). No other abnormal meningeal enhancement. No ventriculomegaly or transependymal edema. No significant intracranial mass effect. Basilar cisterns remain patent. No superimposed restricted diffusion suggestive of acute infarction. No extra-axial collection or acute intracranial hemorrhage. Cervicomedullary junction and pituitary are within normal limits. Vascular: Major intracranial vascular flow voids are preserved, with generalized intracranial artery tortuosity. The major dural venous sinuses are enhancing and appear to be patent. Skull and upper cervical spine: Negative visible cervical spine and spinal cord. Normal bone marrow signal. Sinuses/Orbits: Negative. Other: Scalp and face appear negative. IMPRESSION: 1. 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 CNS lymphoma felt less likely. Area of involvement encompasses 69 x 51 x 36 mm. 2. 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 3. No significant intracranial mass effect. 4. Recommend Neuro-Oncology consultation. Electronically Signed   By: HGenevie AnnM.D.   On: 08/10/2020 18:03   MR LUMBAR SPINE WO CONTRAST  Result Date: 08/07/2020 CLINICAL DATA:  Low back pain and bilateral leg pain  over the last few days. EXAM: MRI LUMBAR SPINE WITHOUT CONTRAST TECHNIQUE: Multiplanar, multisequence MR imaging of the lumbar spine was performed. No intravenous contrast was administered. COMPARISON:  CT 07/28/2020 and 07/13/2020. FINDINGS: Segmentation:  5 lumbar type vertebral bodies. Alignment: Mild thoracolumbar curvature convex to the left and lower lumbar curvature convex to the right. No antero or retrolisthesis. Vertebrae: Old healed minor superior endplate compression deformity at L1 with maximal loss of height 40%. No retropulsed bone. No evidence of recent fracture in the region from inferior T11 to superior S3. Conus medullaris and cauda equina: Conus extends to the L2 level. Conus and cauda equina appear normal. Paraspinal and other soft tissues: No significant finding. Disc levels: Minimal non-compressive disc bulges at T11-12, T12-L1 and L1-2. L2-3: Moderate disc bulge. Slight indentation  of the thecal sac but no apparent compressive stenosis. L3-4: Endplate osteophytes and moderate disc bulge more prominent towards the left. Mild narrowing of the lateral recesses but no visible compressive stenosis. L4-5: Endplate osteophytes and bulging of the disc. Mild narrowing of the lateral recesses but no compressive stenosis. L5-S1: Endplate osteophytes and bulging of the disc. No canal stenosis. Bilateral foraminal narrowing right more than left that would have some potential to affect either or both L5 nerves, more likely the right. IMPRESSION: 1. Old healed minor superior endplate compression deformity at L1 with maximal loss of height of 40%. No retropulsed bone. No evidence of recent fracture. No evidence of sacral insufficiency fracture as question on the previous CT. 2. 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 No compressive central canal stenosis. Bilateral foraminal narrowing at L5-S1 right worse than left that would have some potential to affect either or both L5  nerves, more likely the right. Electronically Signed   By: Nelson Chimes M.D.   On: 08/07/2020 09:24   CT ABDOMEN PELVIS W CONTRAST  Result Date: 07/28/2020 CLINICAL DATA:  Low back and right hip pain since falling from bed last night. EXAM: CT ABDOMEN AND PELVIS WITH CONTRAST TECHNIQUE: Multidetector CT imaging of the abdomen and pelvis was performed using the standard protocol following bolus administration of intravenous contrast. CONTRAST:  17m OMNIPAQUE IOHEXOL 300 MG/ML  SOLN COMPARISON:  Lumbar spine and right hip radiographs same date. CT lumbar spine 07/13/2020. FINDINGS: Lower chest: Atherosclerosis of the aorta and coronary arteries. Possible calcifications of the aortic valve. The heart size is normal. There is no significant pleural or pericardial effusion. Mild atelectasis is present at both lung bases. Hepatobiliary: The liver is normal in density without suspicious focal abnormality. No evidence of gallstones, gallbladder wall thickening or biliary dilatation. Pancreas: Unremarkable. No pancreatic ductal dilatation or surrounding inflammatory changes. Spleen: Small central calcifications, likely granulomas. No splenomegaly or acute abnormality. Adrenals/Urinary Tract: Both adrenal glands appear normal. No evidence of acute kidney injury, urinary tract calculus or hydronephrosis. 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 Dependent high density in the right aspect of the bladder may reflect a small bladder calculus. No definite contrast excretion into the bladder. Stomach/Bowel: No evidence of bowel wall thickening, distention or surrounding inflammatory change. Vascular/Lymphatic: There are no enlarged abdominal or pelvic lymph nodes. No evidence of retroperitoneal hematoma. There is diffuse aortic and branch vessel atherosclerosis. As above, stable right renal artery  aneurysm. Reproductive: Probable fiducial clips in the lower uterine segment. The uterus and ovaries otherwise appear normal. Other: Intact anterior abdominal wall. No ascites, hemoperitoneum or pneumoperitoneum. Musculoskeletal: No acute or significant osseous findings. There is a stable chronic superior endplate compression deformity at L1. There is stable multilevel lumbar spondylosis. Stable sclerosis in the sacral ala bilaterally. IMPRESSION: 1. No acute findings or explanation for the patient's symptoms. 2. 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 3. Possible small bladder calculus. 4. Aortic Atherosclerosis (ICD10-I70.0). CT ADDITIONAL VIEWS (CT OF THE RIGHT HIP) Small field-of-view reformatted images through the right hip were obtained. There is no evidence of acute fracture or dislocation. There are mild right hip degenerative changes. There are stable degenerative changes at the right sacroiliac joint and bilateral sacral ala sclerosis. No significant soft tissue abnormalities in the proximal right thigh. Electronically Signed   By: WCaryl ComesD.  On: 07/28/2020 14:20   DG Chest Portable 1 View  Result Date: 08/10/2020 CLINICAL DATA:  Altered mental status EXAM: PORTABLE CHEST 1 VIEW COMPARISON:  Radiograph 07/28/2020 FINDINGS: Chronically coarsened interstitial changes in the lungs are not significantly changed from comparison studies. These are slightly more pronounced in the left lung than right. No new focal consolidative opacity is seen. No visible pneumothorax or effusion though portion of the left costophrenic sulcus is collimated from view. Cardiac size is within normal limits for portable technique. The aorta is calcified. The remaining cardiomediastinal contours are unremarkable. No acute osseous or soft tissue abnormality. Degenerative changes are present in the imaged spine and shoulders. Telemetry leads and nasal cannula overlie the chest. IMPRESSION: Chronically  coarsened interstitial changes in the lungs. No convincing new focal consolidative opacity. Electronically Signed   By: Lovena Le M.D.   On: 08/10/2020 15:08   DG C-Arm 1-60 Min  Result Date: 08/11/2020 CLINICAL DATA:  Intramedullary nail placement. EXAM: RIGHT FEMUR 2 VIEWS; DG C-ARM 1-60 MIN COMPARISON:  08/10/2020 FINDINGS: Placement of right intramedullary femoral nail with proximal and distal anchoring screws. Hardware is intact as there is anatomic alignment over patient's distal femoral diametaphyseal fracture. Remainder the exam is unchanged. IMPRESSION: Internal fixation of distal femoral diametaphyseal fracture in anatomic alignment. Hardware intact and in adequate position. Electronically Signed   By: Marin Olp M.D.   On: 08/11/2020 13:45   CT NO CHARGE  Result Date: 07/28/2020 CLINICAL DATA:  Low back and right hip pain since falling from bed last night. EXAM: CT ABDOMEN AND PELVIS WITH CONTRAST TECHNIQUE: Multidetector CT imaging of the abdomen and pelvis was performed using the standard protocol following bolus administration of intravenous contrast. CONTRAST:  127m OMNIPAQUE IOHEXOL 300 MG/ML  SOLN COMPARISON:  Lumbar spine and right hip radiographs same date. CT lumbar spine 07/13/2020. FINDINGS: Lower chest: Atherosclerosis of the aorta and coronary arteries. Possible calcifications of the aortic valve. The heart size is normal. There is no significant pleural or pericardial effusion. Mild atelectasis is present at both lung bases. Hepatobiliary: The liver is normal in density without suspicious focal abnormality. No evidence of gallstones, gallbladder wall thickening or biliary dilatation. Pancreas: Unremarkable. No pancreatic ductal dilatation or surrounding inflammatory changes. Spleen: Small central calcifications, likely granulomas. No splenomegaly or acute abnormality. Adrenals/Urinary Tract: Both adrenal glands appear normal. No evidence of acute kidney injury, urinary tract  calculus or hydronephrosis. 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 Dependent high density in the right aspect of the bladder may reflect a small bladder calculus. No definite contrast excretion into the bladder. Stomach/Bowel: No evidence of bowel wall thickening, distention or surrounding inflammatory change. Vascular/Lymphatic: There are no enlarged abdominal or pelvic lymph nodes. No evidence of retroperitoneal hematoma. There is diffuse aortic and branch vessel atherosclerosis. As above, stable right renal artery aneurysm. Reproductive: Probable fiducial clips in the lower uterine segment. The uterus and ovaries otherwise appear normal. Other: Intact anterior abdominal wall. No ascites, hemoperitoneum or pneumoperitoneum. Musculoskeletal: No acute or significant osseous findings. There is a stable chronic superior endplate compression deformity at L1. There is stable multilevel lumbar spondylosis. Stable sclerosis in the sacral ala bilaterally. IMPRESSION: 1. No acute findings or explanation for the patient's symptoms. 2. 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 3. Possible small bladder calculus. 4. Aortic Atherosclerosis (ICD10-I70.0). CT ADDITIONAL VIEWS (CT OF THE RIGHT HIP)  Small field-of-view reformatted images through the right hip were obtained. There is no evidence of acute fracture or dislocation. There are mild right hip degenerative changes. There are stable degenerative changes at the right sacroiliac joint and bilateral sacral ala sclerosis. No significant soft tissue abnormalities in the proximal right thigh. Electronically Signed   By: Richardean Sale M.D.   On: 07/28/2020 14:20   DG HIP UNILAT WITH PELVIS 2-3 VIEWS RIGHT  Result Date: 08/10/2020 CLINICAL DATA:  Right hip pain. EXAM: DG HIP (WITH OR WITHOUT PELVIS) 2-3V RIGHT COMPARISON:  None.  FINDINGS: There is no evidence of hip fracture or dislocation. There is no evidence of arthropathy or other focal bone abnormality. IMPRESSION: Negative. Electronically Signed   By: Dorise Bullion III M.D   On: 08/10/2020 19:42   DG HIP UNILAT WITH PELVIS 2-3 VIEWS RIGHT  Result Date: 08/07/2020 CLINICAL DATA:  Status post fall with subsequent right hip pain. EXAM: DG HIP (WITH OR WITHOUT PELVIS) 2-3V RIGHT COMPARISON:  July 28, 2020 FINDINGS: There is no evidence of an acute hip fracture or dislocation. The mild to moderate severity degenerative changes seen involving both hips. Radiopaque surgical clips are seen overlying the lower pelvis. IMPRESSION: No acute osseous abnormality. Electronically Signed   By: Virgina Norfolk M.D.   On: 08/07/2020 00:58   DG Hip Unilat W or Wo Pelvis 2-3 Views Right  Result Date: 07/28/2020 CLINICAL DATA:  Right hip pain after fall. EXAM: DG HIP (WITH OR WITHOUT PELVIS) 2-3V RIGHT COMPARISON:  None. FINDINGS: There is no evidence of hip fracture or dislocation. There is no evidence of arthropathy or other focal bone abnormality. IMPRESSION: Negative. Electronically Signed   By: Marijo Conception M.D.   On: 07/28/2020 12:46   DG FEMUR 1V RIGHT  Result Date: 08/11/2020 CLINICAL DATA:  Fixation of right femoral fracture. EXAM: RIGHT FEMUR 1 VIEW COMPARISON:  08/10/2020 FINDINGS: Examination demonstrates evidence of patient's right femoral intramedullary nail ridging known distal femoral diametaphyseal fracture. There are proximal and distal anchoring screws. Hardware is intact with anatomic alignment over the fracture site. Exam is otherwise unchanged. IMPRESSION: Fixation of right femoral diametaphyseal fracture with hardware intact and anatomic alignment over the fracture site. Electronically Signed   By: Marin Olp M.D.   On: 08/11/2020 13:48   DG FEMUR, MIN 2 VIEWS RIGHT  Result Date: 08/11/2020 CLINICAL DATA:  Intramedullary nail placement. EXAM: RIGHT FEMUR  2 VIEWS; DG C-ARM 1-60 MIN COMPARISON:  08/10/2020 FINDINGS: Placement of right intramedullary femoral nail with proximal and distal anchoring screws. Hardware is intact as there is anatomic alignment over patient's distal femoral diametaphyseal fracture. Remainder the exam is unchanged. IMPRESSION: Internal fixation of distal femoral diametaphyseal fracture in anatomic alignment. Hardware intact and in adequate position. Electronically Signed   By: Marin Olp M.D.   On: 08/11/2020 13:45    Lab Data:  CBC: Recent Labs  Lab 08/14/20 0446 08/15/20 1001 08/17/20 0359 08/18/20 0327 08/19/20 0158  WBC 6.0 5.4 7.0 7.2 8.1  HGB 10.5* 11.3* 11.3* 11.4* 11.1*  HCT 32.2* 34.1* 34.7* 35.8* 35.2*  MCV 92.8 93.4 93.3 92.5 93.6  PLT 166 184 205 185 357   Basic Metabolic Panel: Recent Labs  Lab 08/14/20 0446 08/15/20 1001 08/17/20 0359 08/18/20 0327 08/19/20 0158  NA 134* 135 140 137 137  K 3.5 4.1 3.4* 3.8 4.2  CL 99 99 100 100 100  CO2 _0 GLUCOSE 186* 214* 246* 228* 250*  BUN  _0 CREATININE 0.55 0.60 0.42* 0.37* 0.33*  CALCIUM 9.2 9.3 10.2 9.8 9.9  MG 1.7 2.1  --   --   --    GFR: Estimated Creatinine Clearance: 80.5 mL/min (A) (by C-G formula based on SCr of 0.33 mg/dL (L)). Liver Function Tests: No results for input(s): AST, ALT, ALKPHOS, BILITOT, PROT, ALBUMIN in the last 168 hours. No results for input(s): LIPASE, AMYLASE in the last 168 hours. No results for input(s): AMMONIA in the last 168 hours. Coagulation Profile: Recent Labs  Lab 08/15/20 1001 08/16/20 1647 08/17/20 0359 08/18/20 0327 08/19/20 0158  INR 1.2 1.2 1.1 1.1 1.1   Cardiac Enzymes: No results for input(s): CKTOTAL, CKMB, CKMBINDEX, TROPONINI in the last 168 hours. BNP (last 3 results) No results for input(s): PROBNP in the last 8760 hours. HbA1C: No results for input(s): HGBA1C in the last 72 hours. CBG: Recent Labs  Lab 08/19/20 1211 08/19/20 1606 08/19/20 2058  08/20/20 0703 08/20/20 1213  GLUCAP 301* 182* 104* 165* 345*   Lipid Profile: No results for input(s): CHOL, HDL, LDLCALC, TRIG, CHOLHDL, LDLDIRECT in the last 72 hours. Thyroid Function Tests: No results for input(s): TSH, T4TOTAL, FREET4, T3FREE, THYROIDAB in the last 72 hours. Anemia Panel: No results for input(s): VITAMINB12, FOLATE, FERRITIN, TIBC, IRON, RETICCTPCT in the last 72 hours. Urine analysis:    Component Value Date/Time   COLORURINE AMBER (A) 08/10/2020 1730   APPEARANCEUR CLOUDY (A) 08/10/2020 1730   LABSPEC 1.028 08/10/2020 1730   PHURINE 5.0 08/10/2020 1730   GLUCOSEU >=500 (A) 08/10/2020 1730   HGBUR LARGE (A) 08/10/2020 1730   BILIRUBINUR NEGATIVE 08/10/2020 1730   KETONESUR 20 (A) 08/10/2020 1730   PROTEINUR NEGATIVE 08/10/2020 1730   UROBILINOGEN 0.2 08/23/2013 1404   NITRITE NEGATIVE 08/10/2020 1730   LEUKOCYTESUR LARGE (A) 08/10/2020 1730     Ripudeep Rai M.D. [facility] 08/20/2020, 1:30 PM   Call night coverage person covering after 7pm

## 2020-08-21 DIAGNOSIS — G939 Disorder of brain, unspecified: Secondary | ICD-10-CM | POA: Diagnosis not present

## 2020-08-21 DIAGNOSIS — E872 Acidosis: Secondary | ICD-10-CM | POA: Diagnosis not present

## 2020-08-21 DIAGNOSIS — M1991 Primary osteoarthritis, unspecified site: Secondary | ICD-10-CM | POA: Diagnosis not present

## 2020-08-21 DIAGNOSIS — R069 Unspecified abnormalities of breathing: Secondary | ICD-10-CM | POA: Diagnosis not present

## 2020-08-21 DIAGNOSIS — Y95 Nosocomial condition: Secondary | ICD-10-CM | POA: Diagnosis present

## 2020-08-21 DIAGNOSIS — Z20822 Contact with and (suspected) exposure to covid-19: Secondary | ICD-10-CM | POA: Diagnosis present

## 2020-08-21 DIAGNOSIS — I499 Cardiac arrhythmia, unspecified: Secondary | ICD-10-CM | POA: Diagnosis not present

## 2020-08-21 DIAGNOSIS — R0602 Shortness of breath: Secondary | ICD-10-CM | POA: Diagnosis not present

## 2020-08-21 DIAGNOSIS — Z743 Need for continuous supervision: Secondary | ICD-10-CM | POA: Diagnosis not present

## 2020-08-21 DIAGNOSIS — R2689 Other abnormalities of gait and mobility: Secondary | ICD-10-CM | POA: Diagnosis not present

## 2020-08-21 DIAGNOSIS — R6520 Severe sepsis without septic shock: Secondary | ICD-10-CM | POA: Diagnosis not present

## 2020-08-21 DIAGNOSIS — E860 Dehydration: Secondary | ICD-10-CM | POA: Diagnosis not present

## 2020-08-21 DIAGNOSIS — C711 Malignant neoplasm of frontal lobe: Secondary | ICD-10-CM | POA: Diagnosis not present

## 2020-08-21 DIAGNOSIS — S72331A Displaced oblique fracture of shaft of right femur, initial encounter for closed fracture: Secondary | ICD-10-CM | POA: Diagnosis not present

## 2020-08-21 DIAGNOSIS — Z794 Long term (current) use of insulin: Secondary | ICD-10-CM | POA: Diagnosis not present

## 2020-08-21 DIAGNOSIS — S72401D Unspecified fracture of lower end of right femur, subsequent encounter for closed fracture with routine healing: Secondary | ICD-10-CM | POA: Diagnosis not present

## 2020-08-21 DIAGNOSIS — G9389 Other specified disorders of brain: Secondary | ICD-10-CM | POA: Diagnosis not present

## 2020-08-21 DIAGNOSIS — S7291XD Unspecified fracture of right femur, subsequent encounter for closed fracture with routine healing: Secondary | ICD-10-CM | POA: Diagnosis not present

## 2020-08-21 DIAGNOSIS — Z515 Encounter for palliative care: Secondary | ICD-10-CM | POA: Diagnosis not present

## 2020-08-21 DIAGNOSIS — R930 Abnormal findings on diagnostic imaging of skull and head, not elsewhere classified: Secondary | ICD-10-CM | POA: Diagnosis not present

## 2020-08-21 DIAGNOSIS — M6281 Muscle weakness (generalized): Secondary | ICD-10-CM | POA: Diagnosis not present

## 2020-08-21 DIAGNOSIS — A419 Sepsis, unspecified organism: Secondary | ICD-10-CM | POA: Diagnosis not present

## 2020-08-21 DIAGNOSIS — R404 Transient alteration of awareness: Secondary | ICD-10-CM | POA: Diagnosis not present

## 2020-08-21 DIAGNOSIS — M2550 Pain in unspecified joint: Secondary | ICD-10-CM | POA: Diagnosis not present

## 2020-08-21 DIAGNOSIS — E118 Type 2 diabetes mellitus with unspecified complications: Secondary | ICD-10-CM | POA: Diagnosis not present

## 2020-08-21 DIAGNOSIS — N182 Chronic kidney disease, stage 2 (mild): Secondary | ICD-10-CM | POA: Diagnosis not present

## 2020-08-21 DIAGNOSIS — E870 Hyperosmolality and hypernatremia: Secondary | ICD-10-CM | POA: Diagnosis not present

## 2020-08-21 DIAGNOSIS — J9601 Acute respiratory failure with hypoxia: Secondary | ICD-10-CM | POA: Diagnosis not present

## 2020-08-21 DIAGNOSIS — G9340 Encephalopathy, unspecified: Secondary | ICD-10-CM | POA: Diagnosis not present

## 2020-08-21 DIAGNOSIS — S72001A Fracture of unspecified part of neck of right femur, initial encounter for closed fracture: Secondary | ICD-10-CM | POA: Diagnosis not present

## 2020-08-21 DIAGNOSIS — R0689 Other abnormalities of breathing: Secondary | ICD-10-CM | POA: Diagnosis not present

## 2020-08-21 DIAGNOSIS — I10 Essential (primary) hypertension: Secondary | ICD-10-CM | POA: Diagnosis not present

## 2020-08-21 DIAGNOSIS — Z7401 Bed confinement status: Secondary | ICD-10-CM | POA: Diagnosis not present

## 2020-08-21 DIAGNOSIS — R1312 Dysphagia, oropharyngeal phase: Secondary | ICD-10-CM | POA: Diagnosis not present

## 2020-08-21 DIAGNOSIS — Z79899 Other long term (current) drug therapy: Secondary | ICD-10-CM | POA: Diagnosis not present

## 2020-08-21 DIAGNOSIS — E1122 Type 2 diabetes mellitus with diabetic chronic kidney disease: Secondary | ICD-10-CM | POA: Diagnosis not present

## 2020-08-21 DIAGNOSIS — I129 Hypertensive chronic kidney disease with stage 1 through stage 4 chronic kidney disease, or unspecified chronic kidney disease: Secondary | ICD-10-CM | POA: Diagnosis not present

## 2020-08-21 DIAGNOSIS — E871 Hypo-osmolality and hyponatremia: Secondary | ICD-10-CM | POA: Diagnosis not present

## 2020-08-21 DIAGNOSIS — Z66 Do not resuscitate: Secondary | ICD-10-CM | POA: Diagnosis not present

## 2020-08-21 DIAGNOSIS — J690 Pneumonitis due to inhalation of food and vomit: Secondary | ICD-10-CM | POA: Diagnosis not present

## 2020-08-21 DIAGNOSIS — C719 Malignant neoplasm of brain, unspecified: Secondary | ICD-10-CM | POA: Diagnosis not present

## 2020-08-21 DIAGNOSIS — L89323 Pressure ulcer of left buttock, stage 3: Secondary | ICD-10-CM | POA: Diagnosis not present

## 2020-08-21 DIAGNOSIS — E119 Type 2 diabetes mellitus without complications: Secondary | ICD-10-CM | POA: Diagnosis not present

## 2020-08-21 DIAGNOSIS — R0902 Hypoxemia: Secondary | ICD-10-CM | POA: Diagnosis not present

## 2020-08-21 DIAGNOSIS — E1169 Type 2 diabetes mellitus with other specified complication: Secondary | ICD-10-CM | POA: Diagnosis not present

## 2020-08-21 DIAGNOSIS — N179 Acute kidney failure, unspecified: Secondary | ICD-10-CM | POA: Diagnosis not present

## 2020-08-21 LAB — PROTIME-INR
INR: 1.1 (ref 0.8–1.2)
Prothrombin Time: 13.8 seconds (ref 11.4–15.2)

## 2020-08-21 LAB — GLUCOSE, CAPILLARY
Glucose-Capillary: 302 mg/dL — ABNORMAL HIGH (ref 70–99)
Glucose-Capillary: 264 mg/dL — ABNORMAL HIGH (ref 70–99)
Glucose-Capillary: 289 mg/dL — ABNORMAL HIGH (ref 70–99)

## 2020-08-21 MED ORDER — INSULIN ASPART 100 UNIT/ML ~~LOC~~ SOLN: 0.0000 [IU] | Freq: Three times a day (TID) | SUBCUTANEOUS | 11 refills | Status: DC

## 2020-08-21 MED ORDER — AMLODIPINE BESYLATE 10 MG PO TABS: 10.0000 mg | ORAL_TABLET | Freq: Every day | ORAL | Status: DC

## 2020-08-21 MED ORDER — ADULT MULTIVITAMIN W/MINERALS CH: 1.0000 | ORAL_TABLET | Freq: Every day | ORAL | Status: AC

## 2020-08-21 MED ORDER — DOCUSATE SODIUM 100 MG PO CAPS: 100.0000 mg | ORAL_CAPSULE | Freq: Two times a day (BID) | ORAL | 0 refills | Status: DC

## 2020-08-21 MED ORDER — INSULIN DETEMIR 100 UNIT/ML ~~LOC~~ SOLN: 25.0000 [IU] | Freq: Every day | SUBCUTANEOUS | 11 refills | Status: DC

## 2020-08-21 MED ORDER — DEXAMETHASONE 4 MG PO TABS: ORAL_TABLET | ORAL | Status: AC

## 2020-08-21 MED ORDER — ONDANSETRON HCL 4 MG PO TABS: 4.0000 mg | ORAL_TABLET | Freq: Four times a day (QID) | ORAL | 0 refills | Status: DC | PRN

## 2020-08-21 MED ORDER — LEVETIRACETAM 500 MG PO TABS: 500.0000 mg | ORAL_TABLET | Freq: Two times a day (BID) | ORAL | 0 refills | Status: AC

## 2020-08-21 MED ORDER — OXYCODONE HCL 5 MG PO TABS
5.0000 mg | ORAL_TABLET | Freq: Four times a day (QID) | ORAL | 0 refills | Status: DC | PRN
Start: 2020-08-21 — End: 2020-09-01

## 2020-08-21 NOTE — TOC Transition Note (Signed)
Transition of Care [facility]) - CM/SW Discharge Note   Patient Details  Name: Susan Odom MRN: [id number] Date of Birth: [birth year]  [facility]) CM/SW Contact:  Bethann Berkshire, [place] Phone Number: 08/21/2020, 11:56 AM   Clinical Narrative:     Patient will DC to: [place]  Anticipated DC date: 08/21/20 Family notified: Lendon Collar  Transport by: Corey Harold   Per MD patient ready for DC to [facility] . RN, patient, patient's family, and facility notified of DC. Discharge Summary and FL2 sent to facility. RN to call report prior to discharge ([phone] Room B20). DC packet on chart. Ambulance transport requested for patient for 2pm.   CSW will sign off for now as social work intervention is no longer needed. Please consult [place] again if new needs arise.   Final next level of care: Skilled Nursing Facility Barriers to Discharge: No Barriers Identified   Patient Goals and CMS Choice Patient states their goals for this hospitalization and ongoing recovery are:: get back walking CMS Medicare.gov Compare Post Acute Care list provided to:: Patient Represenative (must comment) Choice offered to / list presented to : Adult Children  Discharge Placement              Patient chooses bed at:  [facility]) Patient to be transferred to facility by: [place] Name of family member notified: Lucile Hillmann Patient and family notified of of transfer: 08/21/20  Discharge Plan and Services     Post Acute Care Choice: [place]                               Social Determinants of Health (SDOH) Interventions     Readmission Risk Interventions No flowsheet data found.

## 2020-08-21 NOTE — TOC Progression Note (Signed)
Transition of Care [facility]) - Progression Note    Patient Details  Name: OLIVIANA MCGAHEE MRN: [id number] Date of Birth: 10/11/1946  Transition of Care [facility]) CM/SW [place] [place] Phone Number: 08/21/2020, 11:12 AM  Clinical Narrative:     CSW spoke with pt. Daughter. D[facility] initially wanted [facility] but changed mind to [facility] in [place]. Informed CSW that pt did not want any type of vaccine. CSW informed daughter that pt would be limited to window visits. Daughter is okay with this CSW to follow up with [facility] to confirm bed.   CSW called [facility] in [place]. They confirmed bed for today 08/21/20. CSW to start auth with [facility]. Will schedule transport once [facility] confirms good intake time.   CSW initiated auth with [facility] health Ref # [id number]. Clinicals faxed to [facility].  Expected Discharge Plan: [place] Barriers to Discharge: Continued Medical Work up, SNF Pending bed offer  Expected Discharge Plan and Services Expected Discharge Plan: [place] Choice: [place] arrangements for the past 2 months: Single Family Home Expected Discharge Date: 08/21/20                                     Social Determinants of Health (SDOH) Interventions    Readmission Risk Interventions No flowsheet data found.

## 2020-08-21 NOTE — Progress Notes (Signed)
[facility] here to transport patient to [facility]. Pt was incontinent of bowel. Peri care given, transport linen given. Pt given prn pain medicine for bed change activity and transport.  Pt.'s  personal multi colored blanket sent along with her via [facility]. Vital signs stable and updates given to [facility] transport staff.

## 2020-08-21 NOTE — Care Management Important Message (Signed)
Important Message  Patient Details  Name: Susan Odom MRN: [id number] Date of Birth: 03-31-1946   Medicare Important Message Given:  Yes - Important Message mailed due to current National Emergency  Verbal consent obtained due to current National Emergency  Relationship to patient: Self Contact Name: Raneem Mendolia Call Date: 08/21/20  Time: 1132 Phone: [phone] Outcome: No Answer/Busy Important Message mailed to: Patient address on file    Delorse Lek 08/21/2020, 11:32 AM

## 2020-08-21 NOTE — Progress Notes (Signed)
  Subjective: Patient is 74 y.o. Female who is s/p right distal femur fracture fixation. No significant complaints of right knee pain today. She is pending discharge to SNF today or tomorrow.    Objective: Vital signs in last 24 hours: Temp:  [98.1 F (36.7 C)-98.5 F (36.9 C)] 98.1 F (36.7 C) (09/28 1324) Pulse Rate:  [70-90] 78 (09/28 1324) Resp:  [16-17] 17 (09/28 1324) BP: (132-135)/(65-69) 132/67 (09/28 1324) SpO2:  [94 %-97 %] 97 % (09/28 1324)  Intake/Output from previous day: 09/27 0701 - 09/28 0700 In: 480 [P.O.:480] Out: 1000 [Urine:1000] Intake/Output this shift: No intake/output data recorded.  Exam:  Incisions with sutures intact, no surrounding erythema.  No drainage noted. No knee effusion.  No calf tenderness, negative homan sign. DF/PF intact actively. 1+ DP pulse of the RLE.    Labs: Recent Labs    08/19/20 0158  HGB 11.1*   Recent Labs    08/19/20 0158  WBC 8.1  RBC 3.76*  HCT 35.2*  PLT 167   Recent Labs    08/19/20 0158  NA 137  K 4.2  CL 100  CO2 30  BUN 17  CREATININE 0.33*  GLUCOSE 250*  CALCIUM 9.9   Recent Labs    08/20/20 1818 08/21/20 0448  INR 1.1 1.1    Assessment/Plan: Continue TDWB for transfers only to RLE.  Followup with Dr. Marlou Sa in one week in clinic for suture removal and xray check. Continue using knee immobilizer during transfers. Okay for discharge from orthopedic perspective.     Gerrianne Scale Kimala Horne 08/21/2020, 8:57 PM

## 2020-08-21 NOTE — Discharge Summary (Signed)
Physician Discharge Summary   Patient ID: Susan Odom MRN: [id number] DOB/AGE: 1946/04/30 74 y.o.  Admit date: 08/10/2020 Discharge date: 08/21/2020  Primary Care Physician:  Redmond School, MD   Recommendations for Outpatient Follow-up:  1. Follow up with PCP in 1-2 weeks 2. Patient has scheduled follow-up with Dr. Mickeal Skinner on 09/01/2020.  Further management of the grade 4 glioma tumor outpatient per Dr. Renda Rolls recommendations. 3. Please continue Decadron 4 mg p.o. twice daily for 3 days, then taper to 4 mg daily.  Further taper will be done outpatient by Dr. Mickeal Skinner 4. Recommend palliative care to follow at SNF 5. Follow outpatient with Dr. Elta Guadeloupe creates in 2 weeks 6. Continue Keppra 500 mg p.o. twice daily  7. Continue PT OT 8. Her insulin regimen will need to be adjusted as the Decadron is tapered  Home Health: Patient discharging to skilled nursing facility Equipment/Devices:   Discharge Condition: stable CODE STATUS: DNR Diet recommendation: Carb modified diet, dysphagia 3 soft diet   Discharge Diagnoses:    . Hypertension . Femur fracture, right (HCC)  Frontal brain mass-grade 4 glioblastoma, new diagnosis . UTI  Hypokalemia  Type 2 diabetes mellitus, with hyperglycemia, uncontrolled secondary to steroids  Essential hypertension . Pressure injury of skin, right buttock stage II   Consults:  Neurosurgery, Dr. Ellene Route Neuro oncology, Dr. Mickeal Skinner Orthopedics, Dr. Lorin Mercy Palliative medicine    Allergies:  No Known Allergies   DISCHARGE MEDICATIONS: Allergies as of 08/21/2020   No Known Allergies     Medication List    STOP taking these medications   ALPRAZolam 1 MG tablet Commonly known as: XANAX   cephALEXin 500 MG capsule Commonly known as: KEFLEX   gabapentin 600 MG tablet Commonly known as: NEURONTIN   HYDROcodone-acetaminophen 10-325 MG tablet Commonly known as: NORCO   IBU 800 MG tablet Generic drug: ibuprofen   Invokana 100 MG Tabs  tablet Generic drug: canagliflozin   methocarbamol 500 MG tablet Commonly known as: Robaxin   oxyCODONE-acetaminophen 5-325 MG tablet Commonly known as: Percocet   predniSONE 10 MG tablet Commonly known as: DELTASONE     TAKE these medications   amLODipine 10 MG tablet Commonly known as: NORVASC Take 1 tablet (10 mg total) by mouth daily.   clotrimazole-betamethasone cream Commonly known as: LOTRISONE Apply 1 application topically 2 (two) times daily as needed (rash).   dexamethasone 4 MG tablet Commonly known as: DECADRON Take decadron $RemoveBefor'4mg'BRIpLuUHTnWS$  (1 tab) twice a day PO for 3 days, then take $RemoveB'4mg'xxwNUczI$  (1 tab) daily   docusate sodium 100 MG capsule Commonly known as: COLACE Take 1 capsule (100 mg total) by mouth 2 (two) times daily.   insulin aspart 100 UNIT/ML injection Commonly known as: novoLOG Inject 0-15 Units into the skin 3 (three) times daily with meals. Sliding scale  CBG 70 - 120: 0 units: CBG 121 - 150: 2 units; CBG 151 - 200: 3 units; CBG 201 - 250: 5 units; CBG 251 - 300: 8 units;CBG 301 - 350: 11 units; CBG 351 - 400: 15 units; CBG > 400 : 15 units and notify MD   insulin detemir 100 UNIT/ML injection Commonly known as: LEVEMIR Inject 0.25 mLs (25 Units total) into the skin at bedtime.   levETIRAcetam 500 MG tablet Commonly known as: KEPPRA Take 1 tablet (500 mg total) by mouth 2 (two) times daily.   losartan 100 MG tablet Commonly known as: COZAAR Take 100 mg by mouth daily.   multivitamin with minerals Tabs tablet Take 1  tablet by mouth daily.   ondansetron 4 MG tablet Commonly known as: ZOFRAN Take 1 tablet (4 mg total) by mouth every 6 (six) hours as needed for nausea.   oxyCODONE 5 MG immediate release tablet Commonly known as: Oxy IR/ROXICODONE Take 1 tablet (5 mg total) by mouth every 6 (six) hours as needed for moderate pain.   PRESCRIPTION MEDICATION Apply 1 application topically in the morning, at noon, in the evening, and at bedtime. Fanny Cream  (compounded)   terbinafine 250 MG tablet Commonly known as: LAMISIL Take 250 mg by mouth daily.   Vitamin D (Ergocalciferol) 1.25 MG (50000 UNIT) Caps capsule Commonly known as: DRISDOL Take 50,000 Units by mouth once a week.            Discharge Care Instructions  (From admission, onward)         Start     Ordered   08/21/20 0000  Change dressing (specify)       Comments: Dressing change: daily  and as needed if soiled, right buttocks   08/21/20 0803           Brief H and P: For complete details please refer to admission H and P, but in brief *Susan Odom a 74 year old female with past medical history notable for essential hypertension, diabetes, obesity, presented to ED with right knee pain for 2 weeks, progressive over the past 24 hours prior to admission. Associated with swelling. Patient reported a fall few days prior. Additionally, daughter reported gradual onset of confusion and cognitive decline recently. Patient initially went to [facility], ED on 08/07/2020 with back pain, MRI notable for degenerative changes. UA concerning for UTI and was discharged home on Keflex, Percocet and prednisone. Was seen by neurosurgery with worsening back pain and difficulty walking for 2 weeks. She was unable to stand or bear weight in the office; and subsequently was sent to the emergency department for further evaluation and management.  In the ED, WBC count 14.0, sodium 134, ESR within normal limits, CRP 5.4, Covid-19 PCR negative. X-ray right hip notable for severe displaced and comminuted oblique fracture distal right femur. Chest x-ray with no acute findings. CT right knee with mildly comminuted and slightly displaced fracture distal femoral metadiaphysis. CT head with lobular masslike lesion predominantly along the corpus callosum with likely surrounding vasogenic edema concerning for intracranial neoplasm such as lymphoma or glioblastoma multiform a. Neurosurgery and  orthopedics were consulted.   Patient was admitted for further work-up.   Hospital Course:   Right femur fracture - Patient presented with right lower extremity pain and difficulty ambulating - Patient underwent ORIF with intramedullary retrograde femoral nailing on 9/18 by Dr. Marlou Sa - Per orthopedics, TDWBRLE for transfers only x2 weeks, then weightbearing as tolerated -Continue pain control, continue PT OT, cleared by orthopedics for discharge    Frontal brain lesion/mass concerning for glioblastoma/high-grade glioma -Patient's family had reported confusion and recent cognitive decline -CT head showed lobular masslike lesion corpus callosum, intraventricular septum and fornix with likely surrounding vasogenic edema which crosses the genu of the corpus callosum and also present in the left frontal white matter.  -MRI brain showed central callosal, septal, paraventricular infiltrative enhancing tumor most suspicious for glioblastoma multiforming/high-grade glioma vs lymphoma. -Underwent left frontal stereotactic brain biopsy on 9/21, frozen section diagnosis grade 4 glioblastoma  -For discharge, continue Decadron 4 g twice daily for 3 days followed by 4 g daily, further taper outpatient.  Continue Keppra -Dr. Renda Rolls discussed treatment options and  prognosis with the patient and her family -Cognitive deficits improving, avoid sedating meds Continue dysphagia 3 mechanical soft diet  Urinary tract infection -UA positive for UTI, urine culture showed multiple species -Completed 3-day course of IV Rocephin on 9/20  Hypokalemia Resolved  Essential hypertension -Continue amlodipine, losartan   Type 2 diabetes mellitus, uncontrolled with hyperglycemia secondary to steroids On Invokana outpatient, held while inpatient -Hemoglobin A1c 7.3 Will discharge on Levemir 25 units at bedtime, sliding scale insulin as patient is going to be off IV dexamethasone and will start Decadron  taper.  Please adjust insulin regiment as Decadron is being tapered    Leukocytosis Patient afebrile with a WBC count of 14.0 on admission.   Etiology likely due to steroids, UTI  -Resolved   Obesity Estimated body mass index is 30.93 kg/m as calculated from the following:   Height as of this encounter: $RemoveBeforeD'5\' 11"'azBcZSPwUKVsOz$  (1.803 m).   Weight as of this encounter: 100.6 kg.  Pressure injury, stage II right buttock Continue dressing changes daily and as needed  Day of Discharge S: No acute complaints, no acute issues overnight.   BP 134/65 (BP Location: Left Arm)   Pulse 70   Temp 98.2 F (36.8 C) (Oral)   Resp 16   Ht $R'5\' 11"'uc$  (1.803 m)   Wt 100.6 kg   SpO2 96%   BMI 30.93 kg/m   Physical Exam: General: Alert and awake oriented x3 not in any acute distress. HEENT: anicteric sclera, pupils reactive to light and accommodation CVS: S1-S2 clear no murmur rubs or gallops Chest: clear to auscultation bilaterally, no wheezing rales or rhonchi Abdomen: soft nontender, nondistended, normal bowel sounds Extremities: no cyanosis, clubbing or edema noted bilaterally Neuro: no new deficits    Get Medicines reviewed and adjusted: Please take all your medications with you for your next visit with your Primary MD  Please request your Primary MD to go over all hospital tests and procedure/radiological results at the follow up. Please ask your Primary MD to get all Hospital records sent to his/her office.  If you experience worsening of your admission symptoms, develop shortness of breath, life threatening emergency, suicidal or homicidal thoughts you must seek medical attention immediately by calling 911 or calling your MD immediately  if symptoms less severe.  You must read complete instructions/literature along with all the possible adverse reactions/side effects for all the Medicines you take and that have been prescribed to you. Take any new Medicines after you have completely understood and  accept all the possible adverse reactions/side effects.   Do not drive when taking pain medications.   Do not take more than prescribed Pain, Sleep and Anxiety Medications  Special Instructions: If you have smoked or chewed Tobacco  in the last 2 yrs please stop smoking, stop any regular Alcohol  and or any Recreational drug use.  Wear Seat belts while driving.  Please note  You were cared for by a hospitalist during your hospital stay. Once you are discharged, your primary care physician will handle any further medical issues. Please note that NO REFILLS for any discharge medications will be authorized once you are discharged, as it is imperative that you return to your primary care physician (or establish a relationship with a primary care physician if you do not have one) for your aftercare needs so that they can reassess your need for medications and monitor your lab values.   The results of significant diagnostics from this hospitalization (including imaging, microbiology, ancillary and  laboratory) are listed below for reference.      Procedures/Studies:  DG Chest 1 View  Result Date: 07/28/2020 CLINICAL DATA:  Fall last night. EXAM: CHEST  1 VIEW COMPARISON:  August 22, 2013. FINDINGS: Stable cardiomediastinal silhouette. Mild bibasilar subsegmental atelectasis is noted. No pneumothorax or pleural effusion is noted. Bony thorax is unremarkable. IMPRESSION: Mild bibasilar subsegmental atelectasis. Electronically Signed   By: Marijo Conception M.D.   On: 07/28/2020 12:47   DG Lumbar Spine Complete  Result Date: 07/28/2020 CLINICAL DATA:  Lower back pain after fall last night. EXAM: LUMBAR SPINE - COMPLETE 4+ VIEW COMPARISON:  None. FINDINGS: No fracture or significant spondylolisthesis is noted. Moderate to severe degenerative disc disease is noted at all levels of the lumbar spine. 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 No acute abnormality seen in the  lumbar spine. Electronically Signed   By: Marijo Conception M.D.   On: 07/28/2020 12:49   DG Knee 1-2 Views Right  Result Date: 08/10/2020 CLINICAL DATA:  Right knee pain after multiple falls. EXAM: RIGHT KNEE - 1-2 VIEW COMPARISON:  None. FINDINGS: Severely displaced and comminuted oblique fracture is seen involving the distal right femur. There is slight overriding of the fracture fragments. Degenerative changes are seen involving the right knee. IMPRESSION: Severely displaced and comminuted oblique fracture of distal right femur. Electronically Signed   By: Marijo Conception M.D.   On: 08/10/2020 13:24   DG Ankle Complete Left  Result Date: 08/10/2020 CLINICAL DATA:  Pain EXAM: LEFT ANKLE COMPLETE - 3+ VIEW COMPARISON:  None. FINDINGS: Bilateral soft tissue swelling.  No fracture or dislocation. IMPRESSION: Bilateral soft tissue swelling without fracture dislocation. Electronically Signed   By: Dorise Bullion III M.D   On: 08/10/2020 19:48   DG Ankle Complete Right  Result Date: 08/11/2020 CLINICAL DATA:  Right hip pain. EXAM: RIGHT ANKLE - COMPLETE 3+ VIEW COMPARISON:  None. FINDINGS: Significant lateral soft tissue swelling identified. No displaced fractures are identified. There is a subtle lucency in the distal fibula medially which is favored to represent a prominent nutrient foramen. This lucency does not extend through the shaft of the distal fibula. The first lateral view was limited as the patient had a dressing noted posteriorly partially obscuring the posterior distal fibula. A repeat film was then obtained without the dressing. No definitive fractures are seen on today's study. Degenerative changes are seen in the mid and hindfoot. IMPRESSION: No convincing evidence of fracture. Degenerative changes in the mid and hindfoot. Electronically Signed   By: Dorise Bullion III M.D   On: 08/11/2020 09:50   CT HEAD WO CONTRAST  Result Date: 08/15/2020 CLINICAL DATA:  Intracranial mass EXAM: CT  HEAD WITHOUT CONTRAST TECHNIQUE: Contiguous axial images were obtained from the base of the skull through the vertex without intravenous contrast. COMPARISON:  08/10/2020 FINDINGS: Brain: The nodular mass involving the septum pellucidum and extending into the genu of the corpus callosum anteriorly as well as the body and isthmus of the corpus callosum posteriorly, asymmetrically more invasive on the left, is again seen. Since the prior examination, there has developed punctate foci of gas as well as acute hemorrhage within the dominant, left colossal and deep white matter portion of the mass in keeping with stereotactic brain biopsy. A tiny burr hole is seen within the left parietal region and a tiny focus of hemorrhage is seen along the needle tract. The lobular hemorrhage within the mass extends into the left lateral ventricle  with minimal layering hemorrhage and blood clot within the occipital horn. No significant mass effect noted. Minimal pneumocephalus within the left lateral ventricle related to biopsy. Ventricular size is normal.  Cerebellum unremarkable. Vascular: Unremarkable Skull: Otherwise intact Sinuses/Orbits: Paranasal sinuses are clear. Orbits are unremarkable. Other: Mastoid air cells and middle ear cavities are clear. IMPRESSION: Interval stereotactic biopsy of the left colossal/deep white matter portion of the infiltrative mass with mild lobular hemorrhage in the region of biopsy with slight intraventricular extension. No abnormal mass effect. Normal ventricular size. Electronically Signed   By: Helyn Numbers MD   On: 08/15/2020 07:23   CT Head Wo Contrast  Result Date: 08/10/2020 CLINICAL DATA:  Mental status change, history of falls EXAM: CT HEAD WITHOUT CONTRAST TECHNIQUE: Contiguous axial images were obtained from the base of the skull through the vertex without intravenous contrast. COMPARISON:  CT 10/17/2013 FINDINGS: Brain: Ill-defined slightly hyperattenuating lobular masslike  lesions about the corpus callosum,, interventricular septum and fornix with some hypoattenuating, likely vasogenic edema crossing genu of the corpus callosum and deep white matter adjacent the left lateral ventricle as well. Overall, incompletely characterized on the CT imaging. No areas of hyperdense hemorrhage. Some local mass effect and partial effacement of the lateral ventricles is noted. No extra-axial collection or hemorrhage is evident. Chronic mild expansion of the retro cerebellar CSF space likely reflecting arachnoid cyst versus mega cisterna magna. Symmetric prominence of the ventricles, cisterns and sulci compatible with parenchymal volume loss. Chronic small vessel ischemic changes are similar to priors. Vascular: Atherosclerotic calcification of the carotid siphons and intradural vertebral arteries. No hyperdense vessel. Skull: No calvarial fracture or suspicious osseous lesion. No scalp swelling or hematoma. Sinuses/Orbits: Paranasal sinuses and mastoid air cells are predominantly clear. Included orbital structures are unremarkable. Other: None IMPRESSION: Lobular masslike lesion centered predominantly along the corpus callosum, interventricular septum and fornix with likely surrounding vasogenic edema which crosses the genu of the corpus callosum and is also present the left frontal white matter. Primary differential considerations would include a intracranial neoplasm such as CNS lymphoma or glioblastoma multiforme. Overall, incompletely characterized on this unenhanced head CT. Recommend further evaluation with MRI with contrast. No other acute intracranial abnormality. Background of some mild chronic parenchymal volume loss and microvascular angiopathy changes with intracranial atherosclerosis. These results were called by telephone at the time of interpretation on 08/10/2020 at 4:01 pm to provider [facility] , who verbally acknowledged these results. Electronically Signed   By: Kreg Shropshire  M.D.   On: 08/10/2020 16:01   CT Knee Right Wo Contrast  Result Date: 08/10/2020 CLINICAL DATA:  Frequent falls.  Distal femur fracture. EXAM: CT OF THE RIGHT KNEE WITHOUT CONTRAST TECHNIQUE: Multidetector CT imaging of the right knee was performed according to the standard protocol. Multiplanar CT image reconstructions were also generated. COMPARISON:  Radiographs same date. FINDINGS: Bones/Joint/Cartilage Oblique fracture of the distal femoral metadiaphysis is again noted with mild comminution. This fracture is associated with up to 4.3 cm of medial displacement as well as significant overriding of the fracture fragments. There is no intra-articular extension of the fracture. The patella, proximal tibia and proximal fibula are intact. There are advanced tricompartmental degenerative changes at the knee with large osteophytes and intra-articular loose bodies. There is a small joint effusion. Ligaments Suboptimally assessed by CT. The anterior cruciate ligament is not well visualized. Muscles and Tendons The extensor mechanism is intact. Mild generalized muscular atrophy. Soft tissues Small hematoma adjacent to the fracture. No foreign body, soft  tissue emphysema or bone destruction. IMPRESSION: 1. Mildly comminuted and significantly displaced fracture of the distal femoral metadiaphysis as described. No intra-articular extension of the fracture. 2. 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 Electronically Signed   By: Richardean Sale M.D.   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 Electronically Signed   By: Kellie Simmering DO   On: 08/13/2020  22:04   Result Date: 08/13/2020 CLINICAL DATA:  Provided history: Brain mass or lesion; brain lab protocol for surgery 921. EXAM: MRI HEAD WITH CONTRAST TECHNIQUE: Multiplanar, multiecho pulse sequences of the brain and surrounding structures were obtained with intravenous contrast. CONTRAST:  25mL GADAVIST GADOBUTROL 1 MMOL/ML IV SOLN COMPARISON:  Brain MRI with and without contrast 08/10/2020, head CT 08/10/2020. FINDINGS: Brain: Unchanged appearance of lobulated and infiltrative masslike T2/FLAIR hyperintense signal abnormality expanding the body of the corpus callosum with nodular extension along the septum pellucidum and contiguous spread along the margins of the frontal horns of both lateral ventricles. As before, the lesion crosses midline via the body of the corpus callosum with centrum semiovale involvement (greater on the left) and frontal horn involvement (greater on the right). Also unchanged, there is corresponding heterogeneous contrast enhancement throughout the affected areas. Redemonstrated small areas of discontinuous petechial enhancement, most notably within the callosal splenium. Unchanged suspicious abnormal leptomeningeal enhancement with both internal auditory canals and questionable abnormal leptomeningeal enhancement along the ventral brainstem. Background mild scattered T2/FLAIR hyperintensity within the cerebral white matter and brainstem is nonspecific, but most commonly seen on the basis of chronic small vessel ischemia. Vascular: Expected enhancement within the proximal large arterial vessels and dural venous sinuses. Skull and upper cervical spine: No suspicious focal marrow lesion is identified on the acquired sequences. Sinuses/Orbits: No appreciable acute orbital abnormality on the acquired sequences. Mild paranasal sinus mucosal thickening, most notably ethmoidal. Trace fluid within left mastoid air cells. IMPRESSION: Central callosal, septal and periventricular infiltrative an  enhancing tumor most suspicious for glioblastoma/high-grade glioma, unchanged as compared to 08/10/2020. Lymphoma can have a similar imaging appearance, but is considered less likely. 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 Electronically Signed: By: Kellie Simmering DO On: 08/13/2020 21:56   MR BRAIN W WO CONTRAST  Result Date: 08/10/2020 CLINICAL DATA:  74 year old female with mental status changes, falls. Abnormal noncontrast head today suggesting a pericallosal mass. EXAM: MRI HEAD WITHOUT AND WITH CONTRAST TECHNIQUE: Multiplanar, multiecho pulse sequences of the brain and surrounding structures were obtained without and with intravenous contrast. CONTRAST:  83mL GADAVIST GADOBUTROL 1 MMOL/ML IV SOLN COMPARISON:  Head CT earlier today. Prior head CTs 10/17/2013 and earlier. FINDINGS: Brain: Lobulated, infiltrative masslike T2 and FLAIR hyperintense lesion expanding the body of the corpus callosum, with nodular extension along the septum pellucidum, and contiguous spread in and around the frontal horns of both lateral ventricles. The lesion crosses midline via the body of the corpus callosum, with left predominant centrum semiovale but right predominant frontal horn regional involvement. Much of the lesion shows mildly to moderately restricted diffusion but relative T2 hyperintensity. There is petechial hemorrhage in the central colo cell and pericallosal white  matter portion on SWI (series 12, image 35). Following contrast there is heterogeneous enhancement throughout the affected areas. And there are occasional small discontinuous areas of petechial enhancement (most notably in the splenium of the corpus callosum on series 20, image 13. All told, the lesion encompasses roughly 69 x 51 x 36 mm (AP by transverse by CC). Despite the fairly extensive periventricular configuration, no disseminated ependymal  enhancement is identified. However, there is suspicious abnormal leptomeningeal enhancement in both internal auditory canals right greater than left (series 18, image 14). And there is also questionable abnormally increased leptomeningeal enhancement along the ventral brainstem (series 20, image 13). No other abnormal meningeal enhancement. No ventriculomegaly or transependymal edema. No significant intracranial mass effect. Basilar cisterns remain patent. No superimposed restricted diffusion suggestive of acute infarction. No extra-axial collection or acute intracranial hemorrhage. Cervicomedullary junction and pituitary are within normal limits. Vascular: Major intracranial vascular flow voids are preserved, with generalized intracranial artery tortuosity. The major dural venous sinuses are enhancing and appear to be patent. Skull and upper cervical spine: Negative visible cervical spine and spinal cord. Normal bone marrow signal. Sinuses/Orbits: Negative. Other: Scalp and face appear negative. IMPRESSION: 1. 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 CNS lymphoma felt less likely. Area of involvement encompasses 69 x 51 x 36 mm. 2. 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 3. No significant intracranial mass effect. 4. Recommend Neuro-Oncology consultation. Electronically Signed   By: Genevie Ann M.D.   On: 08/10/2020 18:03   MR LUMBAR SPINE WO CONTRAST  Result Date: 08/07/2020 CLINICAL DATA:  Low back pain and bilateral leg pain over the last few days. EXAM: MRI LUMBAR SPINE WITHOUT CONTRAST TECHNIQUE: Multiplanar, multisequence MR imaging of the lumbar spine was performed. No intravenous contrast was administered. COMPARISON:  CT 07/28/2020 and 07/13/2020. FINDINGS:  Segmentation:  5 lumbar type vertebral bodies. Alignment: Mild thoracolumbar curvature convex to the left and lower lumbar curvature convex to the right. No antero or retrolisthesis. Vertebrae: Old healed minor superior endplate compression deformity at L1 with maximal loss of height 40%. No retropulsed bone. No evidence of recent fracture in the region from inferior T11 to superior S3. Conus medullaris and cauda equina: Conus extends to the L2 level. Conus and cauda equina appear normal. Paraspinal and other soft tissues: No significant finding. Disc levels: Minimal non-compressive disc bulges at T11-12, T12-L1 and L1-2. L2-3: Moderate disc bulge. Slight indentation of the thecal sac but no apparent compressive stenosis. L3-4: Endplate osteophytes and moderate disc bulge more prominent towards the left. Mild narrowing of the lateral recesses but no visible compressive stenosis. L4-5: Endplate osteophytes and bulging of the disc. Mild narrowing of the lateral recesses but no compressive stenosis. L5-S1: Endplate osteophytes and bulging of the disc. No canal stenosis. Bilateral foraminal narrowing right more than left that would have some potential to affect either or both L5 nerves, more likely the right. IMPRESSION: 1. Old healed minor superior endplate compression deformity at L1 with maximal loss of height of 40%. No retropulsed bone. No evidence of recent fracture. No evidence of sacral insufficiency fracture as question on the previous CT. 2. 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 No compressive central canal stenosis. Bilateral foraminal narrowing at L5-S1 right worse than left that would have some potential to affect either or both L5 nerves,  more likely the right. Electronically Signed   By: Nelson Chimes M.D.   On: 08/07/2020 09:24   CT ABDOMEN PELVIS W CONTRAST  Result Date: 07/28/2020 CLINICAL DATA:  Low back and right hip pain since falling from bed last night. EXAM:  CT ABDOMEN AND PELVIS WITH CONTRAST TECHNIQUE: Multidetector CT imaging of the abdomen and pelvis was performed using the standard protocol following bolus administration of intravenous contrast. CONTRAST:  175mL OMNIPAQUE IOHEXOL 300 MG/ML  SOLN COMPARISON:  Lumbar spine and right hip radiographs same date. CT lumbar spine 07/13/2020. FINDINGS: Lower chest: Atherosclerosis of the aorta and coronary arteries. Possible calcifications of the aortic valve. The heart size is normal. There is no significant pleural or pericardial effusion. Mild atelectasis is present at both lung bases. Hepatobiliary: The liver is normal in density without suspicious focal abnormality. No evidence of gallstones, gallbladder wall thickening or biliary dilatation. Pancreas: Unremarkable. No pancreatic ductal dilatation or surrounding inflammatory changes. Spleen: Small central calcifications, likely granulomas. No splenomegaly or acute abnormality. Adrenals/Urinary Tract: Both adrenal glands appear normal. No evidence of acute kidney injury, urinary tract calculus or hydronephrosis. 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 Dependent high density in the right aspect of the bladder may reflect a small bladder calculus. No definite contrast excretion into the bladder. Stomach/Bowel: No evidence of bowel wall thickening, distention or surrounding inflammatory change. Vascular/Lymphatic: There are no enlarged abdominal or pelvic lymph nodes. No evidence of retroperitoneal hematoma. There is diffuse aortic and branch vessel atherosclerosis. As above, stable right renal artery aneurysm. Reproductive: Probable fiducial clips in the lower uterine segment. The uterus and ovaries otherwise appear normal. Other: Intact anterior abdominal wall. No ascites, hemoperitoneum or pneumoperitoneum. Musculoskeletal: No acute or  significant osseous findings. There is a stable chronic superior endplate compression deformity at L1. There is stable multilevel lumbar spondylosis. Stable sclerosis in the sacral ala bilaterally. IMPRESSION: 1. No acute findings or explanation for the patient's symptoms. 2. 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 3. Possible small bladder calculus. 4. Aortic Atherosclerosis (ICD10-I70.0). CT ADDITIONAL VIEWS (CT OF THE RIGHT HIP) Small field-of-view reformatted images through the right hip were obtained. There is no evidence of acute fracture or dislocation. There are mild right hip degenerative changes. There are stable degenerative changes at the right sacroiliac joint and bilateral sacral ala sclerosis. No significant soft tissue abnormalities in the proximal right thigh. Electronically Signed   By: Richardean Sale M.D.   On: 07/28/2020 14:20   DG Chest Portable 1 View  Result Date: 08/10/2020 CLINICAL DATA:  Altered mental status EXAM: PORTABLE CHEST 1 VIEW COMPARISON:  Radiograph 07/28/2020 FINDINGS: Chronically coarsened interstitial changes in the lungs are not significantly changed from comparison studies. These are slightly more pronounced in the left lung than right. No new focal consolidative opacity is seen. No visible pneumothorax or effusion though portion of the left costophrenic sulcus is collimated from view. Cardiac size is within normal limits for portable technique. The aorta is calcified. The remaining cardiomediastinal contours are unremarkable. No acute osseous or soft tissue abnormality. Degenerative changes are present in the imaged spine and shoulders. Telemetry leads and nasal cannula overlie the chest. IMPRESSION: Chronically coarsened interstitial changes in the lungs. No convincing new focal consolidative opacity. Electronically Signed   By: Lovena Le M.D.   On: 08/10/2020 15:08   DG C-Arm 1-60 Min  Result Date:  08/11/2020 CLINICAL DATA:  Intramedullary  nail placement. EXAM: RIGHT FEMUR 2 VIEWS; DG C-ARM 1-60 MIN COMPARISON:  08/10/2020 FINDINGS: Placement of right intramedullary femoral nail with proximal and distal anchoring screws. Hardware is intact as there is anatomic alignment over patient's distal femoral diametaphyseal fracture. Remainder the exam is unchanged. IMPRESSION: Internal fixation of distal femoral diametaphyseal fracture in anatomic alignment. Hardware intact and in adequate position. Electronically Signed   By: Marin Olp M.D.   On: 08/11/2020 13:45   CT NO CHARGE  Result Date: 07/28/2020 CLINICAL DATA:  Low back and right hip pain since falling from bed last night. EXAM: CT ABDOMEN AND PELVIS WITH CONTRAST TECHNIQUE: Multidetector CT imaging of the abdomen and pelvis was performed using the standard protocol following bolus administration of intravenous contrast. CONTRAST:  11mL OMNIPAQUE IOHEXOL 300 MG/ML  SOLN COMPARISON:  Lumbar spine and right hip radiographs same date. CT lumbar spine 07/13/2020. FINDINGS: Lower chest: Atherosclerosis of the aorta and coronary arteries. Possible calcifications of the aortic valve. The heart size is normal. There is no significant pleural or pericardial effusion. Mild atelectasis is present at both lung bases. Hepatobiliary: The liver is normal in density without suspicious focal abnormality. No evidence of gallstones, gallbladder wall thickening or biliary dilatation. Pancreas: Unremarkable. No pancreatic ductal dilatation or surrounding inflammatory changes. Spleen: Small central calcifications, likely granulomas. No splenomegaly or acute abnormality. Adrenals/Urinary Tract: Both adrenal glands appear normal. No evidence of acute kidney injury, urinary tract calculus or hydronephrosis. 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 Dependent high density in the right aspect  of the bladder may reflect a small bladder calculus. No definite contrast excretion into the bladder. Stomach/Bowel: No evidence of bowel wall thickening, distention or surrounding inflammatory change. Vascular/Lymphatic: There are no enlarged abdominal or pelvic lymph nodes. No evidence of retroperitoneal hematoma. There is diffuse aortic and branch vessel atherosclerosis. As above, stable right renal artery aneurysm. Reproductive: Probable fiducial clips in the lower uterine segment. The uterus and ovaries otherwise appear normal. Other: Intact anterior abdominal wall. No ascites, hemoperitoneum or pneumoperitoneum. Musculoskeletal: No acute or significant osseous findings. There is a stable chronic superior endplate compression deformity at L1. There is stable multilevel lumbar spondylosis. Stable sclerosis in the sacral ala bilaterally. IMPRESSION: 1. No acute findings or explanation for the patient's symptoms. 2. 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 3. Possible small bladder calculus. 4. Aortic Atherosclerosis (ICD10-I70.0). CT ADDITIONAL VIEWS (CT OF THE RIGHT HIP) Small field-of-view reformatted images through the right hip were obtained. There is no evidence of acute fracture or dislocation. There are mild right hip degenerative changes. There are stable degenerative changes at the right sacroiliac joint and bilateral sacral ala sclerosis. No significant soft tissue abnormalities in the proximal right thigh. Electronically Signed   By: Richardean Sale M.D.   On: 07/28/2020 14:20   DG HIP UNILAT WITH PELVIS 2-3 VIEWS RIGHT  Result Date: 08/10/2020 CLINICAL DATA:  Right hip pain. EXAM: DG HIP (WITH OR WITHOUT PELVIS) 2-3V RIGHT COMPARISON:  None. FINDINGS: There is no evidence of hip fracture or dislocation. There is no evidence of arthropathy or other focal bone abnormality. IMPRESSION: Negative. Electronically Signed   By: Dorise Bullion III M.D   On: 08/10/2020 19:42   DG HIP  UNILAT WITH PELVIS 2-3 VIEWS RIGHT  Result Date: 08/07/2020 CLINICAL DATA:  Status post fall with subsequent right  hip pain. EXAM: DG HIP (WITH OR WITHOUT PELVIS) 2-3V RIGHT COMPARISON:  July 28, 2020 FINDINGS: There is no evidence of an acute hip fracture or dislocation. The mild to moderate severity degenerative changes seen involving both hips. Radiopaque surgical clips are seen overlying the lower pelvis. IMPRESSION: No acute osseous abnormality. Electronically Signed   By: Virgina Norfolk M.D.   On: 08/07/2020 00:58   DG Hip Unilat W or Wo Pelvis 2-3 Views Right  Result Date: 07/28/2020 CLINICAL DATA:  Right hip pain after fall. EXAM: DG HIP (WITH OR WITHOUT PELVIS) 2-3V RIGHT COMPARISON:  None. FINDINGS: There is no evidence of hip fracture or dislocation. There is no evidence of arthropathy or other focal bone abnormality. IMPRESSION: Negative. Electronically Signed   By: Marijo Conception M.D.   On: 07/28/2020 12:46   DG FEMUR 1V RIGHT  Result Date: 08/11/2020 CLINICAL DATA:  Fixation of right femoral fracture. EXAM: RIGHT FEMUR 1 VIEW COMPARISON:  08/10/2020 FINDINGS: Examination demonstrates evidence of patient's right femoral intramedullary nail ridging known distal femoral diametaphyseal fracture. There are proximal and distal anchoring screws. Hardware is intact with anatomic alignment over the fracture site. Exam is otherwise unchanged. IMPRESSION: Fixation of right femoral diametaphyseal fracture with hardware intact and anatomic alignment over the fracture site. Electronically Signed   By: Marin Olp M.D.   On: 08/11/2020 13:48   DG FEMUR, MIN 2 VIEWS RIGHT  Result Date: 08/11/2020 CLINICAL DATA:  Intramedullary nail placement. EXAM: RIGHT FEMUR 2 VIEWS; DG C-ARM 1-60 MIN COMPARISON:  08/10/2020 FINDINGS: Placement of right intramedullary femoral nail with proximal and distal anchoring screws. Hardware is intact as there is anatomic alignment over patient's distal femoral  diametaphyseal fracture. Remainder the exam is unchanged. IMPRESSION: Internal fixation of distal femoral diametaphyseal fracture in anatomic alignment. Hardware intact and in adequate position. Electronically Signed   By: Marin Olp M.D.   On: 08/11/2020 13:45       LAB RESULTS: Basic Metabolic Panel: Recent Labs  Lab 08/15/20 1001 08/17/20 0359 08/18/20 0327 08/19/20 0158  NA 135   < > 137 137  K 4.1   < > 3.8 4.2  CL 99   < > 100 100  CO2 22   < > 30 30  GLUCOSE 214*   < > 228* 250*  BUN 13   < > 18 17  CREATININE 0.60   < > 0.37* 0.33*  CALCIUM 9.3   < > 9.8 9.9  MG 2.1  --   --   --    < > = values in this interval not displayed.   Liver Function Tests: No results for input(s): AST, ALT, ALKPHOS, BILITOT, PROT, ALBUMIN in the last 168 hours. No results for input(s): LIPASE, AMYLASE in the last 168 hours. No results for input(s): AMMONIA in the last 168 hours. CBC: Recent Labs  Lab 08/18/20 0327 08/18/20 0327 08/19/20 0158  WBC 7.2  --  8.1  HGB 11.4*  --  11.1*  HCT 35.8*  --  35.2*  MCV 92.5   < > 93.6  PLT 185  --  167   < > = values in this interval not displayed.   Cardiac Enzymes: No results for input(s): CKTOTAL, CKMB, CKMBINDEX, TROPONINI in the last 168 hours. BNP: Invalid input(s): POCBNP CBG: Recent Labs  Lab 08/20/20 2000 08/21/20 0641  GLUCAP 264* 302*       Disposition and Follow-up: Discharge Instructions    Change dressing (specify)   Complete  by: As directed    Dressing change: daily  and as needed if soiled, right buttocks   Diet Carb Modified   Complete by: As directed    Increase activity slowly   Complete by: As directed        DISPOSITION: [facility] information for follow-up providers    Redmond School, MD Follow up.   Specialty: Internal Medicine Contact information: [address] [place] [address] [phone]        Ventura Sellers, MD Follow  up on 08/28/2020.   Specialties: Psychiatry, Neurology, Oncology Why: at 11: 30AM Contact information: [place] [address] [phone]        Marybelle Killings, MD. Schedule an appointment as soon as possible for a visit in 2 week(s).   Specialty: Orthopedic Surgery Contact information: [place] [address] [phone]            Contact information for after-discharge care    [facility] .   Service: Skilled Nursing Contact information: [address] [place] [phone]                   Time coordinating discharge:  40 minutes  Signed:   Estill Cotta M.D. [facility] 08/21/2020, 11:05 AM

## 2020-08-21 NOTE — Progress Notes (Signed)
Inpatient Diabetes Program Recommendations  AACE/ADA: New Consensus Statement on Inpatient Glycemic Control (2015)  Target Ranges:  Prepandial:   less than 140 mg/dL      Peak postprandial:   less than 180 mg/dL (1-2 hours)      Critically ill patients:  140 - 180 mg/dL   Lab Results  Component Value Date   GLUCAP 302 (H) 08/21/2020   HGBA1C 7.3 (H) 08/10/2020    Diabetes history: Type 2 DM Outpatient Diabetes medications: Invokana 100 mg QD Current orders for Inpatient glycemic control: Levemir 25 units QHS, Novolog 5 units TID, Novolog 0-15 units TID, Novolog 0-5 units QHS Decadron 4 mg Q8H  Inpatient Diabetes Program Recommendations:    Consider increasing Levemir 30 units QHS and Novolog 8 units TID.  Thanks, Bronson Curb, MSN, RNC-OB Diabetes Coordinator [phone] (8a-5p)

## 2020-08-21 NOTE — Progress Notes (Signed)
Waiting for transportation to go to SNF. Report was given to [place] at [place].

## 2020-08-23 DIAGNOSIS — I129 Hypertensive chronic kidney disease with stage 1 through stage 4 chronic kidney disease, or unspecified chronic kidney disease: Secondary | ICD-10-CM | POA: Diagnosis not present

## 2020-08-23 DIAGNOSIS — I10 Essential (primary) hypertension: Secondary | ICD-10-CM | POA: Diagnosis not present

## 2020-08-23 DIAGNOSIS — N182 Chronic kidney disease, stage 2 (mild): Secondary | ICD-10-CM | POA: Diagnosis not present

## 2020-08-23 DIAGNOSIS — C719 Malignant neoplasm of brain, unspecified: Secondary | ICD-10-CM | POA: Diagnosis not present

## 2020-08-23 DIAGNOSIS — M1991 Primary osteoarthritis, unspecified site: Secondary | ICD-10-CM | POA: Diagnosis not present

## 2020-08-23 DIAGNOSIS — G939 Disorder of brain, unspecified: Secondary | ICD-10-CM | POA: Diagnosis not present

## 2020-08-23 DIAGNOSIS — E1169 Type 2 diabetes mellitus with other specified complication: Secondary | ICD-10-CM | POA: Diagnosis not present

## 2020-08-23 DIAGNOSIS — S7291XD Unspecified fracture of right femur, subsequent encounter for closed fracture with routine healing: Secondary | ICD-10-CM | POA: Diagnosis not present

## 2020-08-23 DIAGNOSIS — E1122 Type 2 diabetes mellitus with diabetic chronic kidney disease: Secondary | ICD-10-CM | POA: Diagnosis not present

## 2020-08-28 DIAGNOSIS — S7291XD Unspecified fracture of right femur, subsequent encounter for closed fracture with routine healing: Secondary | ICD-10-CM | POA: Diagnosis not present

## 2020-08-28 DIAGNOSIS — C719 Malignant neoplasm of brain, unspecified: Secondary | ICD-10-CM | POA: Diagnosis not present

## 2020-08-28 DIAGNOSIS — E118 Type 2 diabetes mellitus with unspecified complications: Secondary | ICD-10-CM | POA: Diagnosis not present

## 2020-08-29 DIAGNOSIS — M6281 Muscle weakness (generalized): Secondary | ICD-10-CM | POA: Diagnosis not present

## 2020-08-29 DIAGNOSIS — L89323 Pressure ulcer of left buttock, stage 3: Secondary | ICD-10-CM | POA: Diagnosis not present

## 2020-08-30 ENCOUNTER — Ambulatory Visit: Payer: Medicare Other | Admitting: Internal Medicine

## 2020-08-30 ENCOUNTER — Other Ambulatory Visit: Payer: Self-pay

## 2020-08-30 ENCOUNTER — Emergency Department (HOSPITAL_COMMUNITY): Payer: Medicare Other

## 2020-08-30 ENCOUNTER — Inpatient Hospital Stay (HOSPITAL_COMMUNITY)
Admission: EM | Admit: 2020-08-30 | Discharge: 2020-09-01 | DRG: 871 | Disposition: A | Discharge status: Hospice | Payer: Medicare Other | Source: Skilled Nursing Facility | Attending: Internal Medicine | Admitting: Internal Medicine

## 2020-08-30 ENCOUNTER — Encounter (HOSPITAL_COMMUNITY): Payer: Self-pay

## 2020-08-30 DIAGNOSIS — Z7401 Bed confinement status: Secondary | ICD-10-CM | POA: Diagnosis not present

## 2020-08-30 DIAGNOSIS — E872 Acidosis: Secondary | ICD-10-CM | POA: Diagnosis not present

## 2020-08-30 DIAGNOSIS — Z515 Encounter for palliative care: Secondary | ICD-10-CM | POA: Diagnosis not present

## 2020-08-30 DIAGNOSIS — E871 Hypo-osmolality and hyponatremia: Secondary | ICD-10-CM | POA: Diagnosis not present

## 2020-08-30 DIAGNOSIS — R531 Weakness: Secondary | ICD-10-CM | POA: Diagnosis not present

## 2020-08-30 DIAGNOSIS — I10 Essential (primary) hypertension: Secondary | ICD-10-CM | POA: Diagnosis present

## 2020-08-30 DIAGNOSIS — G9340 Encephalopathy, unspecified: Secondary | ICD-10-CM | POA: Diagnosis not present

## 2020-08-30 DIAGNOSIS — J9601 Acute respiratory failure with hypoxia: Secondary | ICD-10-CM | POA: Diagnosis present

## 2020-08-30 DIAGNOSIS — J690 Pneumonitis due to inhalation of food and vomit: Secondary | ICD-10-CM | POA: Diagnosis present

## 2020-08-30 DIAGNOSIS — N179 Acute kidney failure, unspecified: Secondary | ICD-10-CM | POA: Diagnosis present

## 2020-08-30 DIAGNOSIS — Y95 Nosocomial condition: Secondary | ICD-10-CM | POA: Diagnosis present

## 2020-08-30 DIAGNOSIS — Z66 Do not resuscitate: Secondary | ICD-10-CM | POA: Diagnosis present

## 2020-08-30 DIAGNOSIS — E119 Type 2 diabetes mellitus without complications: Secondary | ICD-10-CM | POA: Diagnosis not present

## 2020-08-30 DIAGNOSIS — E1169 Type 2 diabetes mellitus with other specified complication: Secondary | ICD-10-CM | POA: Diagnosis not present

## 2020-08-30 DIAGNOSIS — C719 Malignant neoplasm of brain, unspecified: Secondary | ICD-10-CM | POA: Diagnosis not present

## 2020-08-30 DIAGNOSIS — E870 Hyperosmolality and hypernatremia: Secondary | ICD-10-CM | POA: Diagnosis present

## 2020-08-30 DIAGNOSIS — G9389 Other specified disorders of brain: Secondary | ICD-10-CM

## 2020-08-30 DIAGNOSIS — Z20822 Contact with and (suspected) exposure to covid-19: Secondary | ICD-10-CM | POA: Diagnosis present

## 2020-08-30 DIAGNOSIS — J189 Pneumonia, unspecified organism: Secondary | ICD-10-CM

## 2020-08-30 DIAGNOSIS — A419 Sepsis, unspecified organism: Secondary | ICD-10-CM | POA: Diagnosis not present

## 2020-08-30 DIAGNOSIS — R0602 Shortness of breath: Secondary | ICD-10-CM

## 2020-08-30 DIAGNOSIS — Z79899 Other long term (current) drug therapy: Secondary | ICD-10-CM | POA: Diagnosis not present

## 2020-08-30 DIAGNOSIS — R0902 Hypoxemia: Secondary | ICD-10-CM | POA: Diagnosis not present

## 2020-08-30 DIAGNOSIS — Z743 Need for continuous supervision: Secondary | ICD-10-CM | POA: Diagnosis not present

## 2020-08-30 DIAGNOSIS — E860 Dehydration: Secondary | ICD-10-CM | POA: Diagnosis present

## 2020-08-30 DIAGNOSIS — R0689 Other abnormalities of breathing: Secondary | ICD-10-CM | POA: Diagnosis not present

## 2020-08-30 DIAGNOSIS — I499 Cardiac arrhythmia, unspecified: Secondary | ICD-10-CM | POA: Diagnosis not present

## 2020-08-30 DIAGNOSIS — S7291XD Unspecified fracture of right femur, subsequent encounter for closed fracture with routine healing: Secondary | ICD-10-CM | POA: Diagnosis not present

## 2020-08-30 DIAGNOSIS — R6520 Severe sepsis without septic shock: Secondary | ICD-10-CM | POA: Diagnosis not present

## 2020-08-30 DIAGNOSIS — G939 Disorder of brain, unspecified: Secondary | ICD-10-CM | POA: Diagnosis not present

## 2020-08-30 DIAGNOSIS — S72401D Unspecified fracture of lower end of right femur, subsequent encounter for closed fracture with routine healing: Secondary | ICD-10-CM

## 2020-08-30 DIAGNOSIS — R069 Unspecified abnormalities of breathing: Secondary | ICD-10-CM | POA: Diagnosis not present

## 2020-08-30 DIAGNOSIS — Z794 Long term (current) use of insulin: Secondary | ICD-10-CM

## 2020-08-30 DIAGNOSIS — R29898 Other symptoms and signs involving the musculoskeletal system: Secondary | ICD-10-CM | POA: Diagnosis not present

## 2020-08-30 LAB — CBC WITH DIFFERENTIAL/PLATELET
Abs Immature Granulocytes: 0.1 10*3/uL — ABNORMAL HIGH (ref 0.00–0.07)
Basophils Absolute: 0.1 10*3/uL (ref 0.0–0.1)
Basophils Relative: 1 %
Eosinophils Absolute: 0 10*3/uL (ref 0.0–0.5)
Eosinophils Relative: 0 %
HCT: 49.1 % — ABNORMAL HIGH (ref 36.0–46.0)
Hemoglobin: 15.4 g/dL — ABNORMAL HIGH (ref 12.0–15.0)
Immature Granulocytes: 1 %
Lymphocytes Relative: 11 %
Lymphs Abs: 1.2 10*3/uL (ref 0.7–4.0)
MCH: 30.6 pg (ref 26.0–34.0)
MCHC: 31.4 g/dL (ref 30.0–36.0)
MCV: 97.4 fL (ref 80.0–100.0)
Monocytes Absolute: 0.5 10*3/uL (ref 0.1–1.0)
Monocytes Relative: 5 %
Neutro Abs: 9.1 10*3/uL — ABNORMAL HIGH (ref 1.7–7.7)
Neutrophils Relative %: 82 %
Platelets: 171 10*3/uL (ref 150–400)
RBC: 5.04 MIL/uL (ref 3.87–5.11)
RDW: 15.8 % — ABNORMAL HIGH (ref 11.5–15.5)
WBC: 11 10*3/uL — ABNORMAL HIGH (ref 4.0–10.5)
nRBC: 0 % (ref 0.0–0.2)

## 2020-08-30 LAB — BASIC METABOLIC PANEL
Anion gap: 15 (ref 5–15)
BUN: 58 mg/dL — ABNORMAL HIGH (ref 8–23)
CO2: 23 mmol/L (ref 22–32)
Calcium: 9.9 mg/dL (ref 8.9–10.3)
Chloride: 110 mmol/L (ref 98–111)
Creatinine, Ser: 0.73 mg/dL (ref 0.44–1.00)
GFR calc non Af Amer: >60 mL/min (ref >60)
Glucose, Bld: 275 mg/dL — ABNORMAL HIGH (ref 70–99)
Potassium: 4.1 mmol/L (ref 3.5–5.1)
Sodium: 148 mmol/L — ABNORMAL HIGH (ref 135–145)

## 2020-08-30 LAB — RESPIRATORY PANEL BY RT PCR (FLU A&B, COVID)
Influenza A by PCR: NEGATIVE
Influenza B by PCR: NEGATIVE
SARS Coronavirus 2 by RT PCR: NEGATIVE

## 2020-08-30 LAB — MRSA PCR SCREENING: MRSA by PCR: NEGATIVE

## 2020-08-30 LAB — TROPONIN I (HIGH SENSITIVITY): Troponin I (High Sensitivity): 17 ng/L (ref <18)

## 2020-08-30 LAB — LACTIC ACID, PLASMA: Lactic Acid, Venous: 2.8 mmol/L (ref 0.5–1.9)

## 2020-08-30 MED ORDER — SODIUM CHLORIDE 0.9 % IV SOLN
2.0000 g | Freq: Once | INTRAVENOUS | Status: AC
  Administered 2020-08-30: 2 g via INTRAVENOUS
  Filled 2020-08-30: qty 2

## 2020-08-30 MED ORDER — ONDANSETRON HCL 4 MG PO TABS: 4.0000 mg | ORAL_TABLET | Freq: Four times a day (QID) | ORAL | Status: DC | PRN

## 2020-08-30 MED ORDER — SODIUM CHLORIDE 0.9 % IV SOLN: 1.0000 g | INTRAVENOUS | Status: DC

## 2020-08-30 MED ORDER — MORPHINE SULFATE (PF) 2 MG/ML IV SOLN: 2.0000 mg | INTRAVENOUS | Status: DC | PRN

## 2020-08-30 MED ORDER — IPRATROPIUM-ALBUTEROL 0.5-2.5 (3) MG/3ML IN SOLN: 3.0000 mL | RESPIRATORY_TRACT | Status: DC | PRN

## 2020-08-30 MED ORDER — VANCOMYCIN HCL 750 MG/150ML IV SOLN
750.0000 mg | Freq: Two times a day (BID) | INTRAVENOUS | Status: DC
  Filled 2020-08-30: qty 150

## 2020-08-30 MED ORDER — DEXAMETHASONE SODIUM PHOSPHATE 4 MG/ML IJ SOLN
4.0000 mg | Freq: Two times a day (BID) | INTRAMUSCULAR | Status: DC
  Administered 2020-08-31 – 2020-09-01 (×3): 4 mg via INTRAVENOUS
  Filled 2020-08-30 (×3): qty 1

## 2020-08-30 MED ORDER — GUAIFENESIN-DM 100-10 MG/5ML PO SYRP: 5.0000 mL | ORAL_SOLUTION | ORAL | Status: DC | PRN

## 2020-08-30 MED ORDER — VANCOMYCIN HCL 2000 MG/400ML IV SOLN
2000.0000 mg | Freq: Once | INTRAVENOUS | Status: DC
  Administered 2020-08-30: 2000 mg via INTRAVENOUS
  Filled 2020-08-30: qty 400

## 2020-08-30 MED ORDER — ACETAMINOPHEN 650 MG RE SUPP: 650.0000 mg | Freq: Four times a day (QID) | RECTAL | Status: DC | PRN

## 2020-08-30 MED ORDER — SENNOSIDES-DOCUSATE SODIUM 8.6-50 MG PO TABS
1.0000 | ORAL_TABLET | Freq: Every evening | ORAL | Status: DC | PRN
  Filled 2020-08-30: qty 1

## 2020-08-30 MED ORDER — SODIUM CHLORIDE 0.9 % IV SOLN
1.0000 g | INTRAVENOUS | Status: DC
  Administered 2020-08-31 – 2020-09-01 (×2): 1 g via INTRAVENOUS
  Filled 2020-08-30 (×2): qty 10

## 2020-08-30 MED ORDER — INSULIN ASPART 100 UNIT/ML ~~LOC~~ SOLN
0.0000 [IU] | SUBCUTANEOUS | Status: DC
  Administered 2020-08-31: 3 [IU] via SUBCUTANEOUS
  Administered 2020-08-31: 8 [IU] via SUBCUTANEOUS
  Administered 2020-08-31 (×2): 3 [IU] via SUBCUTANEOUS
  Administered 2020-08-31: 2 [IU] via SUBCUTANEOUS
  Administered 2020-08-31: 8 [IU] via SUBCUTANEOUS
  Administered 2020-09-01 (×4): 2 [IU] via SUBCUTANEOUS
  Filled 2020-08-30 (×3): qty 1

## 2020-08-30 MED ORDER — LACTATED RINGERS IV BOLUS
1000.0000 mL | Freq: Once | INTRAVENOUS | Status: AC
  Administered 2020-08-30: 1000 mL via INTRAVENOUS

## 2020-08-30 MED ORDER — ENOXAPARIN SODIUM 40 MG/0.4ML ~~LOC~~ SOLN
40.0000 mg | SUBCUTANEOUS | Status: DC
  Administered 2020-08-30 – 2020-08-31 (×2): 40 mg via SUBCUTANEOUS
  Filled 2020-08-30 (×2): qty 0.4

## 2020-08-30 MED ORDER — SODIUM CHLORIDE 0.9 % IV SOLN
500.0000 mg | INTRAVENOUS | Status: DC
  Administered 2020-08-30 – 2020-08-31 (×2): 500 mg via INTRAVENOUS
  Filled 2020-08-30 (×2): qty 500

## 2020-08-30 MED ORDER — ALBUTEROL SULFATE (2.5 MG/3ML) 0.083% IN NEBU
5.0000 mg | INHALATION_SOLUTION | Freq: Once | RESPIRATORY_TRACT | Status: AC
  Administered 2020-08-30: 5 mg via RESPIRATORY_TRACT
  Filled 2020-08-30: qty 6

## 2020-08-30 MED ORDER — ONDANSETRON HCL 4 MG/2ML IJ SOLN: 4.0000 mg | Freq: Four times a day (QID) | INTRAMUSCULAR | Status: DC | PRN

## 2020-08-30 MED ORDER — ACETAMINOPHEN 325 MG PO TABS: 650.0000 mg | ORAL_TABLET | Freq: Four times a day (QID) | ORAL | Status: DC | PRN

## 2020-08-30 MED ORDER — DEXAMETHASONE SODIUM PHOSPHATE 4 MG/ML IJ SOLN
4.0000 mg | Freq: Once | INTRAMUSCULAR | Status: AC
  Administered 2020-08-30: 4 mg via INTRAVENOUS
  Filled 2020-08-30: qty 1

## 2020-08-30 MED ORDER — LEVETIRACETAM IN NACL 500 MG/100ML IV SOLN
500.0000 mg | Freq: Two times a day (BID) | INTRAVENOUS | Status: DC
  Administered 2020-08-31 – 2020-09-01 (×4): 500 mg via INTRAVENOUS
  Filled 2020-08-30 (×4): qty 100

## 2020-08-30 MED ORDER — SODIUM CHLORIDE 0.45 % IV SOLN
INTRAVENOUS | Status: AC
  Administered 2020-08-30: 1000 mL via INTRAVENOUS

## 2020-08-30 NOTE — H&P (Signed)
History and Physical    Susan Odom DGL:[id number] DOB: 1946-04-25 DOA: 08/30/2020  PCP: Redmond School, MD   Patient coming from: [facility]  I have personally briefly reviewed patient's old medical records in [place]  Chief Complaint: Low oxygen level.  HPI: Susan Odom is a 75 y.o. female with medical history significant for brain mass, hypertension, diabetes. Patient was sent from nursing home with reports of low oxygen level, patient's oxygen dropped to 80% on 6 L.  Patient was supposed to be discharged home tomorrow with hospice.  On my evaluation, patient is unable to give me history, she is awake and appears alert, but she mumbles incomprehensibly in response to questions.  Recent prolonged hospitalization 9/17-9/28-patient had a right femur fracture that was repaired 9/18.  Also newly discovered frontal brain lesion/mass concerning for glioblastoma multiform he/high-grade glioma versus lymphoma seen on MRI brain.  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  Patient was discharged on Decadron and Keppra.  Notes on discharge, plan was to consider a course of radiation to extend life span and possibly with chemotherapy depending on EMG MT status.  Patient was subsequently discharged to nursing home.  Patient is currently DNR, and was to be discharged home with hospice tomorrow.  ED Course: Tachycardic to 126, tachypneic to 46, blood pressure 130s, O2 sats 91% on 15 L nonrebreather.  Covid and influenza test negative.  Portable chest x-ray series new dense retrocardiac opacity may represent atelectasis or infiltrate.  Patient was started on broad-spectrum antibiotics IV vancomycin and cefepime for possible HCAP.  Hospitalist to admit for further evaluation and management.  Review of Systems: Unable to assess due to mental status.  Past Medical History:  Diagnosis Date  . Diabetes mellitus   .  Hypertension     Past Surgical History:  Procedure Laterality Date  . APPLICATION OF CRANIAL NAVIGATION N/A 08/14/2020   Procedure: APPLICATION OF CRANIAL NAVIGATION;  Surgeon: Kristeen Miss, MD;  Location: [facility];  Service: Neurosurgery;  Laterality: N/A;  . BREAST SURGERY    . FEMUR IM NAIL Right 08/11/2020   Procedure: INTRAMEDULLARY (IM) RETROGRADE FEMORAL NAILING, with biomet cables;  Surgeon: Meredith Pel, MD;  Location: [facility];  Service: Orthopedics;  Laterality: Right;  . FRAMELESS  BIOPSY WITH BRAINLAB Left 08/14/2020   Procedure: Left frontal stereotactic brain biopsy;  Surgeon: Kristeen Miss, MD;  Location: [facility];  Service: Neurosurgery;  Laterality: Left;     reports that she has never smoked. She has never used smokeless tobacco. She reports that she does not drink alcohol and does not use drugs.  No Known Allergies  Unable to ascertain, patient is unable to talk to me  Prior to Admission medications   Medication Sig Start Date End Date Taking? Authorizing Provider  amLODipine (NORVASC) 10 MG tablet Take 1 tablet (10 mg total) by mouth daily. 08/21/20  Yes Rai, Ripudeep K, MD  clotrimazole-betamethasone (LOTRISONE) cream Apply 1 application topically 2 (two) times daily as needed (rash).   Yes [provider]  dexamethasone (DECADRON) 4 MG tablet Take decadron 4mg  (1 tab) twice a day PO for 3 days, then take 4mg  (1 tab) daily Patient taking differently: Take 4 mg by mouth See admin instructions. 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 08/21/20  Yes Rai, Ripudeep K, MD  docusate sodium (COLACE) 100 MG capsule Take 1 capsule (100 mg total) by mouth 2 (two) times  daily. 08/21/20  Yes Rai, Ripudeep K, MD  insulin aspart (NOVOLOG) 100 UNIT/ML injection Inject 0-15 Units into the skin 3 (three) times daily with meals. Sliding scale  CBG 70 - 120: 0 units: CBG 121 - 150: 2 units; CBG 151 - 200: 3 units; CBG 201 - 250: 5 units; CBG 251 - 300: 8 units;CBG 301 - 350:  11 units; CBG 351 - 400: 15 units; CBG > 400 : 15 units and notify MD 08/21/20  Yes Rai, Ripudeep K, MD  insulin detemir (LEVEMIR) 100 UNIT/ML injection Inject 0.25 mLs (25 Units total) into the skin at bedtime. 08/21/20  Yes Rai, Ripudeep K, MD  levETIRAcetam (KEPPRA) 500 MG tablet Take 1 tablet (500 mg total) by mouth 2 (two) times daily. 08/21/20  Yes Rai, Ripudeep K, MD  losartan (COZAAR) 100 MG tablet Take 100 mg by mouth daily.   Yes [provider]  Multiple Vitamin (MULTIVITAMIN WITH MINERALS) TABS tablet Take 1 tablet by mouth daily. 08/21/20  Yes Rai, Ripudeep K, MD  ondansetron (ZOFRAN) 4 MG tablet Take 1 tablet (4 mg total) by mouth every 6 (six) hours as needed for nausea. 08/21/20  Yes Rai, Ripudeep K, MD  oxyCODONE (OXY IR/ROXICODONE) 5 MG immediate release tablet Take 1 tablet (5 mg total) by mouth every 6 (six) hours as needed for moderate pain. 08/21/20  Yes Rai, Ripudeep K, MD  terbinafine (LAMISIL) 250 MG tablet Take 250 mg by mouth daily.   Yes [provider]  ZINC OXIDE, TOPICAL, 10 % CREA Apply 1 application topically daily as needed.   Yes [provider]  PRESCRIPTION MEDICATION Apply 1 application topically in the morning, at noon, in the evening, and at bedtime. Fanny Cream (compounded)    [provider]  Vitamin D, Ergocalciferol, (DRISDOL) 1.25 MG (50000 UNIT) CAPS capsule Take 50,000 Units by mouth once a week. 03/16/20   [provider]    Physical Exam:  Vitals:   08/30/20 1844 08/30/20 1845 08/30/20 1922 08/30/20 2100  BP:   133/78 130/80  Pulse: (!) 123 (!) 125 (!) 131 (!) 118  Resp: (!) 36 (!) 24  (!) 40  Temp:      TempSrc:      SpO2: 92% 94% 91% 97%  Weight:      Height:        Constitutional: calm, comfortable Vitals:   08/30/20 1844 08/30/20 1845 08/30/20 1922 08/30/20 2100  BP:   133/78 130/80  Pulse: (!) 123 (!) 125 (!) 131 (!) 118  Resp: (!) 36 (!) 24  (!) 40  Temp:      TempSrc:      SpO2: 92% 94% 91%  97%  Weight:      Height:       Eyes: PERRL, lids and conjunctivae normal ENMT: Receiving bronchodilator treatment, mucous membranes are appear dry.   Neck: normal, supple, no masses, no thyromegaly Respiratory: clear to auscultation bilaterally, no wheezing, no crackles. Normal respiratory effort. No accessory muscle use.  Cardiovascular: Tachycardic, regular rate and rhythm, no murmurs / rubs / gallops.  Trace extremity edema bilaterally.  Lower extremities warm and well perfused.  Abdomen: no tenderness, no masses palpated. No hepatosplenomegaly. Bowel sounds positive.  Musculoskeletal: no clubbing / cyanosis. No joint deformity upper and lower extremities. Good ROM, no contractures. Normal muscle tone.  Skin: no rashes, lesions, ulcers. No induration Neurologic: Follows some directions, able to squeeze my hand, unable to lift legs against gravity. Psychiatric: Awake and alert,  but unable to answer questions.  Labs on Admission: I have personally reviewed following labs and imaging studies  CBC: Recent Labs  Lab 08/30/20 1909  WBC 11.0*  NEUTROABS 9.1*  HGB 15.4*  HCT 49.1*  MCV 97.4  PLT 277   Basic Metabolic Panel: Recent Labs  Lab 08/30/20 2008  NA 148*  K 4.1  CL 110  CO2 23  GLUCOSE 275*  BUN 58*  CREATININE 0.73  CALCIUM 9.9    Radiological Exams on Admission: DG Chest Port 1 View  Result Date: 08/30/2020 CLINICAL DATA:  Shortness of breath EXAM: PORTABLE CHEST 1 VIEW COMPARISON:  08/10/2020 FINDINGS: The heart size is stable but mildly enlarged. There is a new dense retrocardiac opacity. Lungs appear somewhat hyperexpanded. Chronic interstitial airspace opacities are again noted. Aortic calcifications are noted. There is no definite acute osseous abnormality. IMPRESSION: New dense retrocardiac opacity may represent atelectasis or infiltrate. Electronically Signed   By: Constance Holster M.D.   On: 08/30/2020 19:30    EKG: Independently reviewed.  Sinus  tachycardia rate 123.  QTc 401.  No significant change from prior.  Assessment/Plan Principal Problem:   Acute respiratory failure with hypoxia (HCC) Active Problems:   Hypertension   Brain mass   Palliative care by specialist   DNR (do not resuscitate)   Acute respiratory failure-Per EMS O2 sats down to 67% on 6 L, currently she is on 15 L nonrebreather.  Likely due to pneumonia. -Supplemental oxygen, nonrebreather, bronchodilators. -Remain n.p.o.  Pneumonia with severe sepsis -Portable chest x-ray shows new retrocardiac opacity disease versus infiltrate.  Tachycardic 120s, tachypneic up to 40s, lactic acidosis of 2.8 with acute organ dysfunction-respiratory failure, mild acute kidney injury.  Covid test negative. -Trend lactic acid -Continue antibiotics ceftriaxone and azithromycin -1 L Ringer's lactate bolus, continue 0.45 N/s 100cc/hr x 20hrs -BMP, CBC in the morning -Mucolytics as needed -Palliative care consult in the morning  Mild AKI-creatinine 0.7, baseline 0.3-0.4. -Hydrate  Hypernatremia-sodium 148.  Patient appears dehydrated. 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  Patient was discharged on Decadron and Keppra.   -Resume Decadron and Keppra, as IV while n.p.o. -Prior to admission, plan was discharged from nursing home to home with hospice.  Hypertension -Hold home antihypertensive for now in the setting of sepsis  DVT prophylaxis: Lovenox Code Status: DNR.  Universal DNR form at bedside.  MOST form also at bedside- requests comfort care and antibiotics. Family Communication: Talked to patients daughter Manahil Vanzile on the phone, she is patient's POA.  Discussed full comfort care, but she wants to continue with antibiotics for now.  Confirmed DNR status.  Agreed to palliative care  discussions tomorrow. Disposition Plan: ~ 2 days Consults called: Palliative care. Admission status: Inpatient, telemetry. I certify that at the point of admission it is my clinical judgment that the patient will require inpatient hospital care spanning beyond 2 midnights from the point of admission due to high intensity of service, high risk for further deterioration and high frequency of surveillance required.   Bethena Roys MD [facility]  08/30/2020, 10:48 PM

## 2020-08-30 NOTE — H&P (Deleted)
History and Physical    Susan Odom CBJ:[id number] DOB: [birth year] DOA: 08/30/2020  PCP: Redmond School, MD   Patient coming from: [facility] home  I have personally briefly reviewed patient's old medical records in [place]  Chief Complaint: Low oxygen level.  HPI: Susan Odom is a 74 y.o. female with medical history significant for brain mass, hypertension, diabetes. Patient was sent from nursing home with reports of low oxygen level, patient's oxygen dropped to 80% on 6 L.  Patient was supposed to be discharged home tomorrow with hospice.  On my evaluation, patient is unable to give me history, she is awake and appears alert, but she mumbles incomprehensibly in response to questions. Talk to patient's daughter Susan Odom on the phone, she reports that over the past month, patient has just been mumbling incomprehensibly.  Recent prolonged hospitalization 9/17-9/28-patient had a right femur fracture that was repaired 9/18.  Also newly discovered frontal brain lesion/mass concerning for glioblastoma multiform he/high-grade glioma versus lymphoma seen on MRI brain.  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  Patient was discharged on Decadron and Keppra.  Notes on discharge, plan was to consider a course of radiation to extend life span and possibly with chemotherapy depending on EMG MT status.  Patient was subsequently discharged to nursing home.  Patient is currently DNR, and was to be discharged home with hospice tomorrow.  ED Course: Tachycardic to 126, tachypneic to 46, blood pressure 130s, O2 sats 91% on 15 L nonrebreather.  Covid and influenza test negative.  Portable chest x-ray series new dense retrocardiac opacity may represent atelectasis or infiltrate.  Patient was started on broad-spectrum antibiotics IV vancomycin and cefepime for possible HCAP.  Hospitalist to admit for further evaluation and  management.  Review of Systems: Unable to assess due to mental status.  Past Medical History:  Diagnosis Date  . Diabetes mellitus   . Hypertension     Past Surgical History:  Procedure Laterality Date  . APPLICATION OF CRANIAL NAVIGATION N/A 08/14/2020   Procedure: APPLICATION OF CRANIAL NAVIGATION;  Surgeon: Kristeen Miss, MD;  Location: [facility];  Service: Neurosurgery;  Laterality: N/A;  . BREAST SURGERY    . FEMUR IM NAIL Right 08/11/2020   Procedure: INTRAMEDULLARY (IM) RETROGRADE FEMORAL NAILING, with biomet cables;  Surgeon: Meredith Pel, MD;  Location: [facility];  Service: Orthopedics;  Laterality: Right;  . FRAMELESS  BIOPSY WITH BRAINLAB Left 08/14/2020   Procedure: Left frontal stereotactic brain biopsy;  Surgeon: Kristeen Miss, MD;  Location: [facility];  Service: Neurosurgery;  Laterality: Left;     reports that she has never smoked. She has never used smokeless tobacco. She reports that she does not drink alcohol and does not use drugs.  No Known Allergies  Unable to ascertain, patient is unable to talk to me  Prior to Admission medications   Medication Sig Start Date End Date Taking? Authorizing Provider  amLODipine (NORVASC) 10 MG tablet Take 1 tablet (10 mg total) by mouth daily. 08/21/20  Yes Rai, Ripudeep K, MD  clotrimazole-betamethasone (LOTRISONE) cream Apply 1 application topically 2 (two) times daily as needed (rash).   Yes [provider]  dexamethasone (DECADRON) 4 MG tablet Take decadron 4mg  (1 tab) twice a day PO for 3 days, then take 4mg  (1 tab) daily Patient taking differently: Take 4 mg by mouth See admin instructions. 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 08/21/20  Yes Rai,  Ripudeep K, MD  docusate sodium (COLACE) 100 MG capsule Take 1 capsule (100 mg total) by mouth 2 (two) times daily. 08/21/20  Yes Rai, Ripudeep K, MD  insulin aspart (NOVOLOG) 100 UNIT/ML injection Inject 0-15 Units into the skin 3 (three) times daily with meals.  Sliding scale  CBG 70 - 120: 0 units: CBG 121 - 150: 2 units; CBG 151 - 200: 3 units; CBG 201 - 250: 5 units; CBG 251 - 300: 8 units;CBG 301 - 350: 11 units; CBG 351 - 400: 15 units; CBG > 400 : 15 units and notify MD 08/21/20  Yes Rai, Ripudeep K, MD  insulin detemir (LEVEMIR) 100 UNIT/ML injection Inject 0.25 mLs (25 Units total) into the skin at bedtime. 08/21/20  Yes Rai, Ripudeep K, MD  levETIRAcetam (KEPPRA) 500 MG tablet Take 1 tablet (500 mg total) by mouth 2 (two) times daily. 08/21/20  Yes Rai, Ripudeep K, MD  losartan (COZAAR) 100 MG tablet Take 100 mg by mouth daily.   Yes [provider]  Multiple Vitamin (MULTIVITAMIN WITH MINERALS) TABS tablet Take 1 tablet by mouth daily. 08/21/20  Yes Rai, Ripudeep K, MD  ondansetron (ZOFRAN) 4 MG tablet Take 1 tablet (4 mg total) by mouth every 6 (six) hours as needed for nausea. 08/21/20  Yes Rai, Ripudeep K, MD  oxyCODONE (OXY IR/ROXICODONE) 5 MG immediate release tablet Take 1 tablet (5 mg total) by mouth every 6 (six) hours as needed for moderate pain. 08/21/20  Yes Rai, Ripudeep K, MD  terbinafine (LAMISIL) 250 MG tablet Take 250 mg by mouth daily.   Yes [provider]  ZINC OXIDE, TOPICAL, 10 % CREA Apply 1 application topically daily as needed.   Yes [provider]  PRESCRIPTION MEDICATION Apply 1 application topically in the morning, at noon, in the evening, and at bedtime. Fanny Cream (compounded)    [provider]  Vitamin D, Ergocalciferol, (DRISDOL) 1.25 MG (50000 UNIT) CAPS capsule Take 50,000 Units by mouth once a week. 03/16/20   [provider]    Physical Exam:  Vitals:   08/30/20 1844 08/30/20 1845 08/30/20 1922 08/30/20 2100  BP:   133/78 130/80  Pulse: (!) 123 (!) 125 (!) 131 (!) 118  Resp: (!) 36 (!) 24  (!) 40  Temp:      TempSrc:      SpO2: 92% 94% 91% 97%  Weight:      Height:        Constitutional: calm, comfortable Vitals:   08/30/20 1844 08/30/20 1845 08/30/20 1922  08/30/20 2100  BP:   133/78 130/80  Pulse: (!) 123 (!) 125 (!) 131 (!) 118  Resp: (!) 36 (!) 24  (!) 40  Temp:      TempSrc:      SpO2: 92% 94% 91% 97%  Weight:      Height:       Eyes: PERRL, lids and conjunctivae normal ENMT: Receiving bronchodilator treatment, mucous membranes are appear dry.   Neck: normal, supple, no masses, no thyromegaly Respiratory: clear to auscultation bilaterally, no wheezing, no crackles. Normal respiratory effort. No accessory muscle use.  Cardiovascular: Tachycardic, regular rate and rhythm, no murmurs / rubs / gallops.  Trace extremity edema bilaterally.  Lower extremities warm and well perfused.  Abdomen: no tenderness, no masses palpated. No hepatosplenomegaly. Bowel sounds positive.  Musculoskeletal: no clubbing / cyanosis. No joint deformity upper and lower extremities. Good ROM, no contractures. Normal muscle tone.  Skin: no rashes, lesions, ulcers.  No induration Neurologic: Follows some directions, able to squeeze my hand, unable to lift legs against gravity. Psychiatric: Awake and alert, but unable to answer questions.  Labs on Admission: I have personally reviewed following labs and imaging studies  CBC: Recent Labs  Lab 08/30/20 1909  WBC 11.0*  NEUTROABS 9.1*  HGB 15.4*  HCT 49.1*  MCV 97.4  PLT 622   Basic Metabolic Panel: Recent Labs  Lab 08/30/20 2008  NA 148*  K 4.1  CL 110  CO2 23  GLUCOSE 275*  BUN 58*  CREATININE 0.73  CALCIUM 9.9    Radiological Exams on Admission: DG Chest Port 1 View  Result Date: 08/30/2020 CLINICAL DATA:  Shortness of breath EXAM: PORTABLE CHEST 1 VIEW COMPARISON:  08/10/2020 FINDINGS: The heart size is stable but mildly enlarged. There is a new dense retrocardiac opacity. Lungs appear somewhat hyperexpanded. Chronic interstitial airspace opacities are again noted. Aortic calcifications are noted. There is no definite acute osseous abnormality. IMPRESSION: New dense retrocardiac opacity may  represent atelectasis or infiltrate. Electronically Signed   By: Constance Holster M.D.   On: 08/30/2020 19:30    EKG: Independently reviewed.  Sinus tachycardia rate 123.  QTc 401.  No significant change from prior.  Assessment/Plan Principal Problem:   Acute respiratory failure with hypoxia (HCC) Active Problems:   Hypertension   Brain mass   Palliative care by specialist   DNR (do not resuscitate)   Acute respiratory failure-Per EMS O2 sats down to 67% on 6 L, currently she is on 15 L nonrebreather.  Likely due to pneumonia. -Supplemental oxygen, nonrebreather, bronchodilators. -N.p.o. for now  Pneumonia with severe sepsis - Portable chest x-ray shows new retrocardiac opacity disease versus infiltrate.  Tachycardic 120s, tachypneic up to 40s, lactic acid- 2.8, , with acute end -organ dysfunction-respiratory failure, mild acute kidney injury.  Covid test negative. - Trend lactic acid -Continue antibiotics IV ceftriaxone and azithromycin.  -1 L Ringer's lactate bolus, continue 0.45 N/s 100cc/hr x 20hrs -BMP, CBC in the morning -Mucolytics as needed -Palliative care consult in the morning  Mild AKI-creatinine 3.7, baseline 0.3-0.4. -Hydrate  Hypernatremia-sodium 148.  Patient appears dehydrated. 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  Patient was discharged on Decadron and Keppra.   -Resume Decadron and Keppra as IV, -Prior to admission, plan was discharged from nursing home to home with hospice. -IV morphine 2 mg as needed.  Controlled diabetes mellitus-random glucose 275.  Patient on steroids.  Recent A1c 7.3. - SSI-M -Hold home Levemir.  DVT prophylaxis: Lovenox Code Status: DNR.  Universal DNR form at bedside.  MOST form also at bedside, requests comfort care and antibiotics. Family  Communication: Talked to patient's daughter Susan Odom on the phone who is a patient who is the POA.  Discussed considering full comfort care, at this time want antibiotics, she wants to come in to see her mom. Disposition Plan: > 2 days Consults called: Please consult palliative care in the morning Admission status: Inpatient, telemetry. I certify that at the point of admission it is my clinical judgment that the patient will require inpatient hospital care spanning beyond 2 midnights from the point of admission due to high intensity of service, high risk for further deterioration and high frequency of surveillance required.   Bethena Roys MD [facility]  08/30/2020, 10:43 PM

## 2020-08-30 NOTE — ED Triage Notes (Signed)
Pt came in via [facility] from [facility]. [facility] said pt was comfort measures and would be going home tomorrow on hospice. Pt oxygen dropped to 80% on 6 lpm. Daughter stated she did not want to see pt in this way and to send her to ER. DNR and MOST form present.

## 2020-08-30 NOTE — ED Notes (Signed)
Stitches noted to right knee area and right hip area. Roughly 23 stitches total.

## 2020-08-30 NOTE — ED Provider Notes (Signed)
[facility] EMERGENCY DEPARTMENT Provider Note   CSN: [id number] Arrival date & time: 08/30/20  1807     History Chief Complaint  Patient presents with  . Shortness of Breath    Susan Odom is a 74 y.o. female.  Level five caveat for acuity of condition.  Patient is a resident of [facility].  Patient was going home tomorrow with hospice.  O2 sats dropped to 80% on 6 L.  Daughter states patient should go to the emergency department.        Past Medical History:  Diagnosis Date  . Diabetes mellitus   . Hypertension     Patient Active Problem List   Diagnosis Date Noted  . Femur fracture ([place]) 08/11/2020  . Palliative care by specialist   . DNR (do not resuscitate)   . Goals of care, counseling/discussion   . Femur fracture, right ([place])   . Leukocytosis   . Brain mass   . Blunt trauma of hip   . Class 1 obesity due to excess calories with body mass index (BMI) of 32.0 to 32.9 in adult   . Hypertension   . Rhabdomyolysis 07/28/2020  . Fall 07/28/2020  . Pressure injury of skin 07/28/2020  . Generalized weakness 07/28/2020    Past Surgical History:  Procedure Laterality Date  . APPLICATION OF CRANIAL NAVIGATION N/A 08/14/2020   Procedure: APPLICATION OF CRANIAL NAVIGATION;  Surgeon: Kristeen Miss, MD;  Location: [place];  Service: Neurosurgery;  Laterality: N/A;  . BREAST SURGERY    . FEMUR IM NAIL Right 08/11/2020   Procedure: INTRAMEDULLARY (IM) RETROGRADE FEMORAL NAILING, with biomet cables;  Surgeon: Meredith Pel, MD;  Location: [place];  Service: Orthopedics;  Laterality: Right;  . FRAMELESS  BIOPSY WITH BRAINLAB Left 08/14/2020   Procedure: Left frontal stereotactic brain biopsy;  Surgeon: Kristeen Miss, MD;  Location: [place];  Service: Neurosurgery;  Laterality: Left;     OB History   No obstetric history on file.     History reviewed. No pertinent family history.  Social History   Tobacco Use  . Smoking status: Never Smoker  .  Smokeless tobacco: Never Used  Substance Use Topics  . Alcohol use: No  . Drug use: No    Home Medications Prior to Admission medications   Medication Sig Start Date End Date Taking? Authorizing Provider  amLODipine (NORVASC) 10 MG tablet Take 1 tablet (10 mg total) by mouth daily. 08/21/20  Yes Rai, Ripudeep K, MD  clotrimazole-betamethasone (LOTRISONE) cream Apply 1 application topically 2 (two) times daily as needed (rash).   Yes [provider]  dexamethasone (DECADRON) 4 MG tablet Take decadron 4mg  (1 tab) twice a day PO for 3 days, then take 4mg  (1 tab) daily Patient taking differently: Take 4 mg by mouth See admin instructions. 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 08/21/20  Yes Rai, Ripudeep K, MD  docusate sodium (COLACE) 100 MG capsule Take 1 capsule (100 mg total) by mouth 2 (two) times daily. 08/21/20  Yes Rai, Ripudeep K, MD  insulin aspart (NOVOLOG) 100 UNIT/ML injection Inject 0-15 Units into the skin 3 (three) times daily with meals. Sliding scale  CBG 70 - 120: 0 units: CBG 121 - 150: 2 units; CBG 151 - 200: 3 units; CBG 201 - 250: 5 units; CBG 251 - 300: 8 units;CBG 301 - 350: 11 units; CBG 351 - 400: 15 units; CBG > 400 : 15 units and notify MD 08/21/20  Yes Rai, Ripudeep K, MD  insulin detemir (LEVEMIR) 100 UNIT/ML injection Inject 0.25 mLs (25 Units total) into the skin at bedtime. 08/21/20  Yes Rai, Ripudeep K, MD  levETIRAcetam (KEPPRA) 500 MG tablet Take 1 tablet (500 mg total) by mouth 2 (two) times daily. 08/21/20  Yes Rai, Ripudeep K, MD  losartan (COZAAR) 100 MG tablet Take 100 mg by mouth daily.   Yes [provider]  Multiple Vitamin (MULTIVITAMIN WITH MINERALS) TABS tablet Take 1 tablet by mouth daily. 08/21/20  Yes Rai, Ripudeep K, MD  ondansetron (ZOFRAN) 4 MG tablet Take 1 tablet (4 mg total) by mouth every 6 (six) hours as needed for nausea. 08/21/20  Yes Rai, Ripudeep K, MD  oxyCODONE (OXY IR/ROXICODONE) 5 MG immediate release tablet  Take 1 tablet (5 mg total) by mouth every 6 (six) hours as needed for moderate pain. 08/21/20  Yes Rai, Ripudeep K, MD  terbinafine (LAMISIL) 250 MG tablet Take 250 mg by mouth daily.   Yes [provider]  ZINC OXIDE, TOPICAL, 10 % CREA Apply 1 application topically daily as needed.   Yes [provider]  PRESCRIPTION MEDICATION Apply 1 application topically in the morning, at noon, in the evening, and at bedtime. Fanny Cream (compounded)    [provider]  Vitamin D, Ergocalciferol, (DRISDOL) 1.25 MG (50000 UNIT) CAPS capsule Take 50,000 Units by mouth once a week. 03/16/20   [provider]    Allergies    Patient has no known allergies.  Review of Systems   Review of Systems  Unable to perform ROS: Acuity of condition    Physical Exam Updated Vital Signs BP 130/80   Pulse (!) 118   Temp 97.8 F (36.6 C) (Oral)   Resp (!) 40   Ht 5\' 11"  (1.803 m)   Wt 100.6 kg   SpO2 97%   BMI 30.93 kg/m   Physical Exam Vitals and nursing note reviewed.  Constitutional:      Comments: Dyspneic, tachypneic  HENT:     Head: Normocephalic and atraumatic.  Eyes:     Conjunctiva/sclera: Conjunctivae normal.  Cardiovascular:     Rate and Rhythm: Tachycardia present.  Pulmonary:     Comments: Coarse breath sounds Abdominal:     General: Bowel sounds are normal.     Palpations: Abdomen is soft.  Musculoskeletal:        General: Normal range of motion.     Cervical back: Neck supple.  Skin:    General: Skin is warm.  Neurological:     General: No focal deficit present.  Psychiatric:        Behavior: Behavior normal.     ED Results / Procedures / Treatments   Labs (all labs ordered are listed, but only abnormal results are displayed) Labs Reviewed  CBC WITH DIFFERENTIAL/PLATELET - Abnormal; Notable for the following components:      Result Value   WBC 11.0 (*)    Hemoglobin 15.4 (*)    HCT 49.1 (*)    RDW 15.8 (*)    Neutro Abs 9.1 (*)     Abs Immature Granulocytes 0.10 (*)    All other components within normal limits  BASIC METABOLIC PANEL - Abnormal; Notable for the following components:   Sodium 148 (*)    Glucose, Bld 275 (*)    BUN 58 (*)    All other components within normal limits  RESPIRATORY PANEL BY RT PCR (FLU A&B, COVID)  MRSA PCR SCREENING  TROPONIN I (HIGH SENSITIVITY)  TROPONIN I (HIGH SENSITIVITY)    EKG None  Radiology DG Chest Port 1 View  Result Date: 08/30/2020 CLINICAL DATA:  Shortness of breath EXAM: PORTABLE CHEST 1 VIEW COMPARISON:  08/10/2020 FINDINGS: The heart size is stable but mildly enlarged. There is a new dense retrocardiac opacity. Lungs appear somewhat hyperexpanded. Chronic interstitial airspace opacities are again noted. Aortic calcifications are noted. There is no definite acute osseous abnormality. IMPRESSION: New dense retrocardiac opacity may represent atelectasis or infiltrate. Electronically Signed   By: Constance Holster M.D.   On: 08/30/2020 19:30    Procedures Procedures (including critical care time)  Medications Ordered in ED Medications  albuterol (PROVENTIL) (2.5 MG/3ML) 0.083% nebulizer solution 5 mg (has no administration in time range)  vancomycin (VANCOREADY) IVPB 2000 mg/400 mL (2,000 mg Intravenous New Bag/Given 08/30/20 2103)  vancomycin (VANCOREADY) IVPB 750 mg/150 mL (has no administration in time range)  dexamethasone (DECADRON) injection 4 mg (4 mg Intravenous Given 08/30/20 1922)  ceFEPIme (MAXIPIME) 2 g in sodium chloride 0.9 % 100 mL IVPB (0 g Intravenous Stopped 08/30/20 2054)    ED Course  I have reviewed the triage vital signs and the nursing notes.  Pertinent labs & imaging results that were available during my care of the patient were reviewed by me and considered in my medical decision making (see chart for details).    MDM Rules/Calculators/A&P                          Chest x-ray reveals retrocardiac opacity.  Will initiate antibiotics for  HCAP.  Admit.  DO NOT RESUSCITATE. Final Clinical Impression(s) / ED Diagnoses Final diagnoses:  HCAP (healthcare-associated pneumonia)    Rx / DC Orders ED Discharge Orders    None       Nat Christen, MD 08/30/20 2148

## 2020-08-30 NOTE — ED Notes (Signed)
Respiratory at bedside.

## 2020-08-30 NOTE — Progress Notes (Signed)
Pharmacy Antibiotic Note  MOREEN PIGGOTT is a 74 y.o. female admitted on 08/30/2020 with pneumonia.  Pharmacy has been consulted for vancomycin dosing.  Plan: vancomycin 2gm iv x 1  Vancomycin 750mg  IV every 12 hours.  Goal trough 15-20 mcg/mL.  Height: 5\' 11"  (180.3 cm) Weight: 100.6 kg (221 lb 12.5 oz) IBW/kg (Calculated) : 70.8  Temp (24hrs), Avg:97.8 F (36.6 C), Min:97.8 F (36.6 C), Max:97.8 F (36.6 C)  Recent Labs  Lab 08/30/20 1909  WBC 11.0*    Estimated Creatinine Clearance: 80.5 mL/min (A) (by C-G formula based on SCr of 0.33 mg/dL (L)).    No Known Allergies  Antimicrobials this admission: 10/7 vancomycin >>  10/7 cefepime x 1   Microbiology results: 10/7 Covid 19: sent 10/7 MRSA PCR: sent   Thank you for allowing pharmacy to be a part of this patient's care.  Donna Christen Fawn Desrocher 08/30/2020 8:18 PM

## 2020-08-30 NOTE — ED Notes (Signed)
Date and time results received: 08/30/20 22:34 (use smartphrase ".now" to insert current time)  Test: lactic Acid  Critical Value: 2.8  Name of Provider Notified:  MDEmokpae  Orders Received? Or Actions Taken?: Provider notified

## 2020-08-31 DIAGNOSIS — J9601 Acute respiratory failure with hypoxia: Secondary | ICD-10-CM | POA: Diagnosis not present

## 2020-08-31 LAB — BASIC METABOLIC PANEL
Anion gap: 14 (ref 5–15)
BUN: 61 mg/dL — ABNORMAL HIGH (ref 8–23)
CO2: 21 mmol/L — ABNORMAL LOW (ref 22–32)
Calcium: 9.2 mg/dL (ref 8.9–10.3)
Chloride: 112 mmol/L — ABNORMAL HIGH (ref 98–111)
Creatinine, Ser: 0.83 mg/dL (ref 0.44–1.00)
GFR calc non Af Amer: >60 mL/min (ref >60)
Glucose, Bld: 338 mg/dL — ABNORMAL HIGH (ref 70–99)
Potassium: 3.6 mmol/L (ref 3.5–5.1)
Sodium: 147 mmol/L — ABNORMAL HIGH (ref 135–145)

## 2020-08-31 LAB — GLUCOSE, CAPILLARY
Glucose-Capillary: 155 mg/dL — ABNORMAL HIGH (ref 70–99)
Glucose-Capillary: 139 mg/dL — ABNORMAL HIGH (ref 70–99)
Glucose-Capillary: 151 mg/dL — ABNORMAL HIGH (ref 70–99)

## 2020-08-31 LAB — CBC
HCT: 41.9 % (ref 36.0–46.0)
Hemoglobin: 13 g/dL (ref 12.0–15.0)
MCH: 30.1 pg (ref 26.0–34.0)
MCHC: 31 g/dL (ref 30.0–36.0)
MCV: 97 fL (ref 80.0–100.0)
Platelets: 132 10*3/uL — ABNORMAL LOW (ref 150–400)
RBC: 4.32 MIL/uL (ref 3.87–5.11)
RDW: 15.4 % (ref 11.5–15.5)
WBC: 9.8 10*3/uL (ref 4.0–10.5)
nRBC: 0 % (ref 0.0–0.2)

## 2020-08-31 LAB — CBG MONITORING, ED
Glucose-Capillary: 293 mg/dL — ABNORMAL HIGH (ref 70–99)
Glucose-Capillary: 265 mg/dL — ABNORMAL HIGH (ref 70–99)
Glucose-Capillary: 190 mg/dL — ABNORMAL HIGH (ref 70–99)

## 2020-08-31 LAB — LACTIC ACID, PLASMA: Lactic Acid, Venous: 2.9 mmol/L (ref 0.5–1.9)

## 2020-08-31 NOTE — Evaluation (Signed)
Clinical/Bedside Swallow Evaluation Patient Details  Name: Susan Odom MRN: [id number] Date of Birth: 09/30/1946  Today's Date: 08/31/2020 Time: SLP Start Time (ACUTE ONLY): 1540 SLP Stop Time (ACUTE ONLY): 1633 SLP Time Calculation (min) (ACUTE ONLY): 30 min  Past Medical History:  Past Medical History:  Diagnosis Date  . Diabetes mellitus   . Hypertension    Past Surgical History:  Past Surgical History:  Procedure Laterality Date  . APPLICATION OF CRANIAL NAVIGATION N/A 08/14/2020   Procedure: APPLICATION OF CRANIAL NAVIGATION;  Surgeon: Kristeen Miss, MD;  Location: [place];  Service: Neurosurgery;  Laterality: N/A;  . BREAST SURGERY    . FEMUR IM NAIL Right 08/11/2020   Procedure: INTRAMEDULLARY (IM) RETROGRADE FEMORAL NAILING, with biomet cables;  Surgeon: Meredith Pel, MD;  Location: [place];  Service: Orthopedics;  Laterality: Right;  . FRAMELESS  BIOPSY WITH BRAINLAB Left 08/14/2020   Procedure: Left frontal stereotactic brain biopsy;  Surgeon: Kristeen Miss, MD;  Location: [place];  Service: Neurosurgery;  Laterality: Left;   HPI:  Susan Odom is a 74 y.o. female with medical history significant for brain mass, hypertension, diabetes. Patient was sent from nursing home with reports of low oxygen level, patient's oxygen dropped to 80% on 6 L.  Patient was supposed to be discharged home tomorrow with hospice.  On evaluation in the ED, patient is unable to give me history, she is awake and appears alert, but she mumbles incomprehensibly in response to questions. Recent prolonged hospitalization 9/17-9/28 where she had BSE with recommendation for regular/thin.   Assessment / Plan / Recommendation Clinical Impression  Clinical swallowing evalution completed while Pt was sitting upright in bed; Pt was easily roused and verbally responded to basic y/n questions. SLP provided oral care with toothettes attempting to cleanse thick copious secretions throughout oral cavity, in  bilateral sulcii and on posterior lingual surface. Note congested sounded breathing prior to PO trials. Pt responded verbally that she did want an ice chip but when it was placed in her mouth there was no oral response or ability to contain it and it subsequently fell out of her mouth; small tsp sips of thin liquid were provided and again with no oral response to move bolus to posterior position for swallowing, all liquid fell out of the oral cavity with no swallow triggered. Attempted 1/2 tsp bite of puree again with NO oral manipulation of bolus; all puree was manually removed by SLP. SLP again provided oral care to cleanse residue from oral cavity. Question if Pts inability to attend to bolus and trigger a swallow is related to her mentation and awareness despite the fact that she was verbally responsive and alert. At this time recommend NPO and medications to be administered via alternative means. Recommend consider suction being placed at bedside to facilitate oral care. ST will continue to follow for possible diet upgrade as appropriate and for goals of care, thank you SLP Visit Diagnosis: Dysphagia, unspecified (R13.10)    Aspiration Risk  Severe aspiration risk;Risk for inadequate nutrition/hydration    Diet Recommendation NPO   Medication Administration: Via alternative means    Other  Recommendations Oral Care Recommendations: Oral care QID   Follow up Recommendations        Frequency and Duration min 1 x/week  1 week       Prognosis Prognosis for Safe Diet Advancement: Guarded Barriers to Reach Goals: Cognitive deficits;Severity of deficits      Swallow Study   General Date of  Onset: 08/30/20 HPI: Susan Odom is a 74 y.o. female with medical history significant for brain mass, hypertension, diabetes. Patient was sent from nursing home with reports of low oxygen level, patient's oxygen dropped to 80% on 6 L.  Patient was supposed to be discharged home tomorrow with hospice.   On evaluation in the ED, patient is unable to give me history, she is awake and appears alert, but she mumbles incomprehensibly in response to questions. Recent prolonged hospitalization 9/17-9/28 where she had BSE with recommendation for regular/thin. Type of Study: Bedside Swallow Evaluation Previous Swallow Assessment: BSE 9/22 Diet Prior to this Study: Dysphagia 1 (puree);Thin liquids Temperature Spikes Noted: No Respiratory Status: Nasal cannula History of Recent Intubation: No Behavior/Cognition: Alert;Cooperative;Requires cueing;Doesn't follow directions Oral Cavity Assessment: Excessive secretions;Dried secretions;Dry Oral Care Completed by SLP: Yes Oral Cavity - Dentition: Poor condition Self-Feeding Abilities: Total assist Patient Positioning: Upright in bed Baseline Vocal Quality: Low vocal intensity Volitional Cough: Congested;Cognitively unable to elicit Volitional Swallow: Unable to elicit    Oral/Motor/Sensory Function Overall Oral Motor/Sensory Function: Generalized oral weakness   Ice Chips Ice chips: Impaired Presentation: Spoon Oral Phase Impairments: Poor awareness of bolus;Reduced labial seal Oral Phase Functional Implications: Right anterior spillage;Left anterior spillage   Thin Liquid Thin Liquid: Impaired Presentation: Spoon Oral Phase Impairments: Poor awareness of bolus;Reduced labial seal;Reduced lingual movement/coordination Oral Phase Functional Implications: Right anterior spillage;Left anterior spillage Pharyngeal  Phase Impairments: Unable to trigger swallow    Nectar Thick Nectar Thick Liquid: Not tested   Honey Thick Honey Thick Liquid: Not tested   Puree Puree: Impaired Oral Phase Impairments: Poor awareness of bolus;Reduced lingual movement/coordination;Reduced labial seal Oral Phase Functional Implications: Right anterior spillage;Left anterior spillage   Solid     Solid: Not tested     Susan Odom H. Roddie Mc, Junior Speech Language  Pathologist  Wende Bushy 08/31/2020,4:33 PM

## 2020-08-31 NOTE — ED Notes (Signed)
Patient changed and cleaned

## 2020-08-31 NOTE — TOC Initial Note (Signed)
Transition of Care [facility]) - Initial/Assessment Note    Patient Details  Name: Susan Odom MRN: [id number] Date of Birth: Nov 08, 1946  [facility] CM/SW Contact:    Shade Flood, LCSW Phone Number: 08/31/2020, 1:42 PM  Clinical Narrative:                  Pt admitted from [facility]. Pt was scheduled to dc home with [facility] from [place] today. Per MD, pt with pneumonia and need for IV antibiotics and she is currently on 11L HFNC O2 so she is not stable for hospital dc.   Spoke with Jackelyn Poling at [place] at [facility] to update. Per Cassandra, the DME for home Hospice care has been delivered so they are ready once pt medically stable for dc.  TOC will follow and assist with dc planning.  Expected Discharge Plan: Home w Hospice Care Barriers to Discharge: Continued Medical Work up   Patient Goals and CMS Choice        Expected Discharge Plan and Services Expected Discharge Plan: Home w Hospice Care In-house Referral: Clinical Social Work   Post Acute Care Choice: Hospice                                        Prior Living Arrangements/Services   Lives with:: Adult Children Patient language and need for interpreter reviewed:: Yes        Need for Family Participation in Patient Care: Yes (Comment) Care giver support system in place?: Yes (comment)   Criminal Activity/Legal Involvement Pertinent to Current Situation/Hospitalization: No - Comment as needed  Activities of Daily Living      Permission Sought/Granted                  Emotional Assessment       Orientation: : Oriented to Self Alcohol / Substance Use: Not Applicable Psych Involvement: No (comment)  Admission diagnosis:  SOB (shortness of breath) [R06.02] Acute respiratory failure with hypoxia ([place]) [J96.01] HCAP (healthcare-associated pneumonia) [J18.9] Patient Active Problem List   Diagnosis Date Noted  . Acute respiratory  failure with hypoxia ([place]) 08/30/2020  . Femur fracture ([place]) 08/11/2020  . Palliative care by specialist   . DNR (do not resuscitate)   . Goals of care, counseling/discussion   . Femur fracture, right ([place])   . Leukocytosis   . Brain mass   . Blunt trauma of hip   . Class 1 obesity due to excess calories with body mass index (BMI) of 32.0 to 32.9 in adult   . Hypertension   . Rhabdomyolysis 07/28/2020  . Fall 07/28/2020  . Pressure injury of skin 07/28/2020  . Generalized weakness 07/28/2020   PCP:  Redmond School, MD Pharmacy:   [facility] [place] [address] [place] [address] Phone: [phone] Fax: [phone]     Social Determinants of Health (SDOH) Interventions    Readmission Risk Interventions Readmission Risk Prevention Plan 08/31/2020  Transportation Screening Complete  Medication Review (RN CM) Complete  Some recent data might be hidden

## 2020-08-31 NOTE — Progress Notes (Signed)
PROGRESS NOTE    Susan Odom  NIO:[id number] DOB: 1946-09-13 DOA: 08/30/2020 PCP: Redmond School, MD   Brief Narrative:  Per HPI: Susan Odom is a 75 y.o. female with medical history significant for brain mass, hypertension, diabetes. Patient was sent from nursing home with reports of low oxygen level, patient's oxygen dropped to 80% on 6 L.  Patient was supposed to be discharged home tomorrow with hospice.  On my evaluation, patient is unable to give me history, she is awake and appears alert, but she mumbles incomprehensibly in response to questions.  Recent prolonged hospitalization 9/17-9/28-patient had a right femur fracture that was repaired 9/18.  Also newly discovered frontal brain lesion/mass concerning for glioblastoma multiform he/high-grade glioma versus lymphoma seen on MRI brain.  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  Patient was discharged on Decadron and Keppra.  Notes on discharge, plan was to consider a course of radiation to extend life span and possibly with chemotherapy depending on EMG MT status.  Patient was subsequently discharged to nursing home.  Patient is currently DNR, and was to be discharged home with hospice tomorrow.  10/8: Patient was admitted with acute hypoxemic respiratory failure in the setting of severe sepsis secondary to pneumonia.  She has been started on Rocephin and azithromycin and appears to be on room air this morning, but is overall quite encephalopathic/confused.  Assessment & Plan:   Principal Problem:   Acute respiratory failure with hypoxia (HCC) Active Problems:   Hypertension   Brain mass   Palliative care by specialist   DNR (do not resuscitate)   Acute hypoxemic respiratory failure secondary to severe sepsis with pneumonia -Continue oxygen supplementation and wean as tolerated -Ongoing treatment of pneumonia with Rocephin and azithromycin -Continue  current IV fluid -Repeat CBC and lactic acid in a.m.  Mild AKI -Creatinine 0.83 with baseline 0.3-0.4 -Remain off nephrotoxic agents and monitor closely -Continue IV fluid  Hyponatremia-improving -Remain on half-normal saline  Grade 4 glioblastoma -Status post stereotactic brain biopsy 9/21 -Continue on Decadron and Keppra IV while hospitalized  History of hypertension -Hold antihypertensives for now given sepsis   DVT prophylaxis: Lovenox Code Status: DNR/DNI Family Communication: Discussed with daughter on phone 10/8 Disposition Plan:  Status is: Inpatient  Remains inpatient appropriate because:IV treatments appropriate due to intensity of illness or inability to take PO and Inpatient level of care appropriate due to severity of illness   Dispo: The patient is from: Home              Anticipated d/c is to: Home              Anticipated d/c date is: 2 days              Patient currently is not medically stable to d/c.  Patient still having very high oxygen requirements noted.  She will require ongoing IV antibiotics and treatment as ordered with weaning of oxygen prior to discharge.   Consultants:   Palliative care  Procedures:   See below  Antimicrobials:  Anti-infectives (From admission, onward)   Start     Dose/Rate Route Frequency Ordered Stop   08/31/20 0900  vancomycin (VANCOREADY) IVPB 750 mg/150 mL  Status:  Discontinued        750 mg 150 mL/hr over 60 Minutes Intravenous Every 12 hours 08/30/20 2018 08/30/20 2235   08/31/20 0800  cefTRIAXone (ROCEPHIN) 1 g in sodium chloride 0.9 % 100 mL  IVPB        1 g 200 mL/hr over 30 Minutes Intravenous Every 24 hours 08/30/20 2235     08/30/20 2245  cefTRIAXone (ROCEPHIN) 1 g in sodium chloride 0.9 % 100 mL IVPB  Status:  Discontinued        1 g 200 mL/hr over 30 Minutes Intravenous Every 24 hours 08/30/20 2234 08/30/20 2235   08/30/20 2245  azithromycin (ZITHROMAX) 500 mg in sodium chloride 0.9 % 250 mL IVPB         500 mg 250 mL/hr over 60 Minutes Intravenous Every 24 hours 08/30/20 2235     08/30/20 2030  vancomycin (VANCOREADY) IVPB 2000 mg/400 mL  Status:  Discontinued        2,000 mg 200 mL/hr over 120 Minutes Intravenous  Once 08/30/20 2018 08/30/20 2349   08/30/20 2000  ceFEPIme (MAXIPIME) 2 g in sodium chloride 0.9 % 100 mL IVPB        2 g 200 mL/hr over 30 Minutes Intravenous  Once 08/30/20 1955 08/30/20 2054       Subjective: Patient seen and evaluated today and appears quite altered and is mumbling.  Objective: Vitals:   08/31/20 0730 08/31/20 0800 08/31/20 1019 08/31/20 1147  BP: 119/72 112/65 137/90   Pulse: 81 80 73   Resp: (!) 35 (!) 31 17   Temp:   98.5 F (36.9 C)   TempSrc:   Oral   SpO2: 92% 90% 100% 100%  Weight:      Height:        Intake/Output Summary (Last 24 hours) at 08/31/2020 1238 Last data filed at 08/31/2020 0910 Gross per 24 hour  Intake 1950.15 ml  Output --  Net 1950.15 ml   Filed Weights   08/30/20 1836  Weight: 100.6 kg    Examination:  General exam: Appears calm and comfortable  Respiratory system: Clear to auscultation. Respiratory effort normal.  Currently on 11 L nasal cannula oxygen. Cardiovascular system: S1 & S2 heard, RRR. Gastrointestinal system: Abdomen is nondistended, soft and nontender. Central nervous system: Intermittently somnolent Extremities: No edema Skin: No rashes, lesions or ulcers Psychiatry: Cannot be assessed given patient condition    Data Reviewed: I have personally reviewed following labs and imaging studies  CBC: Recent Labs  Lab 08/30/20 1909 08/31/20 0200  WBC 11.0* 9.8  NEUTROABS 9.1*  --   HGB 15.4* 13.0  HCT 49.1* 41.9  MCV 97.4 97.0  PLT 171 720*   Basic Metabolic Panel: Recent Labs  Lab 08/30/20 2008 08/31/20 0200  NA 148* 147*  K 4.1 3.6  CL 110 112*  CO2 23 21*  GLUCOSE 275* 338*  BUN 58* 61*  CREATININE 0.73 0.83  CALCIUM 9.9 9.2   GFR: Estimated Creatinine Clearance: 77.6  mL/min (by C-G formula based on SCr of 0.83 mg/dL). Liver Function Tests: No results for input(s): AST, ALT, ALKPHOS, BILITOT, PROT, ALBUMIN in the last 168 hours. No results for input(s): LIPASE, AMYLASE in the last 168 hours. No results for input(s): AMMONIA in the last 168 hours. Coagulation Profile: No results for input(s): INR, PROTIME in the last 168 hours. Cardiac Enzymes: No results for input(s): CKTOTAL, CKMB, CKMBINDEX, TROPONINI in the last 168 hours. BNP (last 3 results) No results for input(s): PROBNP in the last 8760 hours. HbA1C: No results for input(s): HGBA1C in the last 72 hours. CBG: Recent Labs  Lab 08/31/20 0112 08/31/20 0425 08/31/20 0738 08/31/20 1109  GLUCAP 293* 265* 190* 155*   Lipid  Profile: No results for input(s): CHOL, HDL, LDLCALC, TRIG, CHOLHDL, LDLDIRECT in the last 72 hours. Thyroid Function Tests: No results for input(s): TSH, T4TOTAL, FREET4, T3FREE, THYROIDAB in the last 72 hours. Anemia Panel: No results for input(s): VITAMINB12, FOLATE, FERRITIN, TIBC, IRON, RETICCTPCT in the last 72 hours. Sepsis Labs: Recent Labs  Lab 08/30/20 1909 08/31/20 0200  LATICACIDVEN 2.8* 2.9*    Recent Results (from the past 240 hour(s))  Respiratory Panel by RT PCR (Flu A&B, Covid) - Nasopharyngeal Swab     Status: None   Collection Time: 08/30/20  7:03 PM   Specimen: Nasopharyngeal Swab  Result Value Ref Range Status   SARS Coronavirus 2 by RT PCR NEGATIVE NEGATIVE Final    Comment: (NOTE) SARS-CoV-2 target nucleic acids are NOT DETECTED.  The SARS-CoV-2 RNA is generally detectable in upper respiratoy specimens during the acute phase of infection. The lowest concentration of SARS-CoV-2 viral copies this assay can detect is 131 copies/mL. A negative result does not preclude SARS-Cov-2 infection and should not be used as the sole basis for treatment or other patient management decisions. A negative result may occur with  improper specimen  collection/handling, submission of specimen other than nasopharyngeal swab, presence of viral mutation(s) within the areas targeted by this assay, and inadequate number of viral copies (<131 copies/mL). A negative result must be combined with clinical observations, patient history, and epidemiological information. The expected result is Negative.  Fact Sheet for Patients:  PinkCheek.be  Fact Sheet for Healthcare Providers:  GravelBags.it  This test is no t yet approved or cleared by the [place] FDA and  has been authorized for detection and/or diagnosis of SARS-CoV-2 by FDA under an Emergency Use Authorization (EUA). This EUA will remain  in effect (meaning this test can be used) for the duration of the COVID-19 declaration under Section 564(b)(1) of the Act, 21 U.S.C. section 360bbb-3(b)(1), unless the authorization is terminated or revoked sooner.     Influenza A by PCR NEGATIVE NEGATIVE Final   Influenza B by PCR NEGATIVE NEGATIVE Final    Comment: (NOTE) The Xpert Xpress SARS-CoV-2/FLU/RSV assay is intended as an aid in  the diagnosis of influenza from Nasopharyngeal swab specimens and  should not be used as a sole basis for treatment. Nasal washings and  aspirates are unacceptable for Xpert Xpress SARS-CoV-2/FLU/RSV  testing.  Fact Sheet for Patients: PinkCheek.be  Fact Sheet for Healthcare Providers: GravelBags.it  This test is not yet approved or cleared by the [place] FDA and  has been authorized for detection and/or diagnosis of SARS-CoV-2 by  FDA under an Emergency Use Authorization (EUA). This EUA will remain  in effect (meaning this test can be used) for the duration of the  Covid-19 declaration under Section 564(b)(1) of the Act, 21  U.S.C. section 360bbb-3(b)(1), unless the authorization is  terminated or revoked. Performed at [facility], [address] [place], [place] [address]   MRSA PCR Screening     Status: None   Collection Time: 08/30/20  8:55 PM   Specimen: Nasal Mucosa; Nasopharyngeal  Result Value Ref Range Status   MRSA by PCR NEGATIVE NEGATIVE Final    Comment:        The GeneXpert MRSA Assay (FDA approved for NASAL specimens only), is one component of a comprehensive MRSA colonization surveillance program. It is not intended to diagnose MRSA infection nor to guide or monitor treatment for MRSA infections. Performed at [facility], [address], [place], [place] [address]  Radiology Studies: DG Chest Port 1 View  Result Date: 08/30/2020 CLINICAL DATA:  Shortness of breath EXAM: PORTABLE CHEST 1 VIEW COMPARISON:  08/10/2020 FINDINGS: The heart size is stable but mildly enlarged. There is a new dense retrocardiac opacity. Lungs appear somewhat hyperexpanded. Chronic interstitial airspace opacities are again noted. Aortic calcifications are noted. There is no definite acute osseous abnormality. IMPRESSION: New dense retrocardiac opacity may represent atelectasis or infiltrate. Electronically Signed   By: Constance Holster M.D.   On: 08/30/2020 19:30        Scheduled Meds: . dexamethasone (DECADRON) injection  4 mg Intravenous Q12H  . enoxaparin (LOVENOX) injection  40 mg Subcutaneous Q24H  . insulin aspart  0-15 Units Subcutaneous Q4H   Continuous Infusions: . sodium chloride 100 mL/hr at 08/31/20 1033  . azithromycin Stopped (08/31/20 0034)  . cefTRIAXone (ROCEPHIN)  IV Stopped (08/31/20 0910)  . levETIRAcetam 500 mg (08/31/20 1036)     LOS: 1 day    Time spent: 35 minutes    Brezlyn Manrique D Manuella Ghazi, DO [facility]  If 7PM-7AM, please contact night-coverage [email or web address] 08/31/2020, 12:38 PM

## 2020-08-31 NOTE — Care Management Important Message (Signed)
Important Message  Patient Details  Name: Susan Odom MRN: [id number] Date of Birth: Nov 16, 1946   Medicare Important Message Given:  Yes     Tommy Medal 08/31/2020, 4:14 PM

## 2020-09-01 DIAGNOSIS — J9601 Acute respiratory failure with hypoxia: Secondary | ICD-10-CM | POA: Diagnosis not present

## 2020-09-01 LAB — BASIC METABOLIC PANEL
Anion gap: 8 (ref 5–15)
BUN: 40 mg/dL — ABNORMAL HIGH (ref 8–23)
CO2: 26 mmol/L (ref 22–32)
Calcium: 9.4 mg/dL (ref 8.9–10.3)
Chloride: 113 mmol/L — ABNORMAL HIGH (ref 98–111)
Creatinine, Ser: 0.37 mg/dL — ABNORMAL LOW (ref 0.44–1.00)
GFR, Estimated: >60 mL/min (ref >60)
Glucose, Bld: 149 mg/dL — ABNORMAL HIGH (ref 70–99)
Potassium: 3.4 mmol/L — ABNORMAL LOW (ref 3.5–5.1)
Sodium: 147 mmol/L — ABNORMAL HIGH (ref 135–145)

## 2020-09-01 LAB — LACTIC ACID, PLASMA: Lactic Acid, Venous: 1 mmol/L (ref 0.5–1.9)

## 2020-09-01 LAB — CBC
HCT: 38.8 % (ref 36.0–46.0)
Hemoglobin: 12 g/dL (ref 12.0–15.0)
MCH: 29.9 pg (ref 26.0–34.0)
MCHC: 30.9 g/dL (ref 30.0–36.0)
MCV: 96.5 fL (ref 80.0–100.0)
Platelets: 104 10*3/uL — ABNORMAL LOW (ref 150–400)
RBC: 4.02 MIL/uL (ref 3.87–5.11)
RDW: 15.1 % (ref 11.5–15.5)
WBC: 5.7 10*3/uL (ref 4.0–10.5)
nRBC: 0 % (ref 0.0–0.2)

## 2020-09-01 LAB — GLUCOSE, CAPILLARY
Glucose-Capillary: 132 mg/dL — ABNORMAL HIGH (ref 70–99)
Glucose-Capillary: 129 mg/dL — ABNORMAL HIGH (ref 70–99)
Glucose-Capillary: 136 mg/dL — ABNORMAL HIGH (ref 70–99)
Glucose-Capillary: 137 mg/dL — ABNORMAL HIGH (ref 70–99)

## 2020-09-01 LAB — MAGNESIUM: Magnesium: 2.4 mg/dL (ref 1.7–2.4)

## 2020-09-01 MED ORDER — MORPHINE SULFATE 10 MG/5ML PO SOLN
5.0000 mg | ORAL | 0 refills | Status: AC | PRN
Start: 2020-09-01 — End: ?

## 2020-09-01 MED ORDER — GUAIFENESIN-DM 100-10 MG/5ML PO SYRP: 5.0000 mL | ORAL_SOLUTION | ORAL | 0 refills | Status: AC | PRN

## 2020-09-01 NOTE — TOC Transition Note (Signed)
Transition of Care [facility]) - CM/SW Discharge Note   Patient Details  Name: Susan Odom MRN: [id number] Date of Birth: [birth year]  [facility] CM/SW Contact:  Natasha Bence, LCSW Phone Number: 09/01/2020, 1:14 PM   Clinical Narrative:    CSW notified of patient's d/c. CSW contacted hospice to notify them of new O2 SAT and d/c. [facility] agreeable to provide equipment to home and services. CSW completed med necessity and called EMS. TOC signing off.   Final next level of care: Home w Hospice Care Barriers to Discharge: Barriers Resolved   Patient Goals and CMS Choice        Discharge Placement                Patient to be transferred to facility by: [facility] Name of family member notified: Lendon Collar Patient and family notified of of transfer: 09/01/20  Discharge Plan and Services In-house Referral: Clinical Social Work   Post Acute Care Choice: Hospice                               Social Determinants of Health (SDOH) Interventions     Readmission Risk Interventions Readmission Risk Prevention Plan 08/31/2020  Transportation Screening Complete  Medication Review (RN CM) Complete  Some recent data might be hidden

## 2020-09-01 NOTE — Discharge Summary (Signed)
Physician Discharge Summary  Susan Odom PXT:[id number] DOB: 11/06/1946 DOA: 08/30/2020  PCP: Redmond School, MD  Admit date: 08/30/2020  Discharge date: 09/01/2020  Admitted From:SNF  Disposition:  Home hospice  Recommendations for Outpatient Follow-up:  1. Follow-up with outpatient home hospice services  Home Health: Home hospice  Equipment/Devices: None  Discharge Condition: Stable  CODE STATUS: DNR  Diet recommendation: Soft/pleasure feeds  Brief/Interim Summary: Per HPI: Susan Odom a 74 y.o.femalewith medical history significant forbrain mass, hypertension,diabetes. Patient was sent from nursing home with reports of low oxygen level, patient's oxygen dropped to 80% on 6 L. Patient was supposed to be discharged home tomorrow with hospice. On my evaluation, patient is unable to give me history, she is awake and appears alert, but she mumbles incomprehensibly in response to questions.  Recent prolonged hospitalization 9/17-9/28-patient had a right femur fracture that was repaired 9/18. Also newly discovered frontal brain lesion/mass concerning for glioblastoma multiform he/high-grade glioma versus lymphoma seen on MRI brain. 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 Patient was discharged on Decadron and Keppra. Notes on discharge, plan was to consider a course of radiation to extend life span and possibly with chemotherapy depending on EMG MT status. Patient was subsequently discharged to nursing home. Patient is currently DNR,and was to be discharged home with hospice tomorrow.  10/8: Patient was admitted with acute hypoxemic respiratory failure in the setting of severe sepsis secondary to pneumonia.  She has been started on Rocephin and azithromycin and appears to be on room air this morning, but is overall quite encephalopathic/confused.  10/9: Patient has no further oxygen requirements and  is currently on room air with saturations in the mid 90th percentile. No significant dyspnea or acute overnight events noted. She continues to remain encephalopathic which is now her baseline as confirmed by family members. She has had swallow evaluation on 10/8 and has been recommended to remain n.p.o. as she is a high aspiration risk. It appears that this is contributing to her pneumonia and she will continue to remain an aspiration risk at this point. There is no clinical signs and continuing further antibiotic treatments at this time IV or oral as this will continue to remain a problem and family members understand. She does not appear to have much time left and would like to go home and remain comfortable with her home hospice services which have already been arranged. She is currently stable for discharge.  Discharge Diagnoses:  Principal Problem:   Acute respiratory failure with hypoxia [facility]) Active Problems:   Hypertension   Brain mass   Palliative care by specialist   DNR (do not resuscitate)  Principal discharge diagnosis: Acute hypoxemic respiratory failure secondary to severe sepsis in the setting of likely aspiration pneumonia. Grade 4 glioblastoma.  Discharge Instructions  Discharge Instructions    Diet - low sodium heart healthy   Complete by: As directed    Increase activity slowly   Complete by: As directed    No wound care   Complete by: As directed      Allergies as of 09/01/2020   No Known Allergies     Medication List    STOP taking these medications   amLODipine 10 MG tablet Commonly known as: NORVASC   docusate sodium 100 MG capsule Commonly known as: COLACE   insulin aspart 100 UNIT/ML injection Commonly known as: novoLOG   insulin detemir 100 UNIT/ML injection Commonly known as: LEVEMIR  losartan 100 MG tablet Commonly known as: COZAAR   ondansetron 4 MG tablet Commonly known as: ZOFRAN   oxyCODONE 5 MG immediate release tablet Commonly known  as: Oxy IR/ROXICODONE     TAKE these medications   clotrimazole-betamethasone cream Commonly known as: LOTRISONE Apply 1 application topically 2 (two) times daily as needed (rash).   dexamethasone 4 MG tablet Commonly known as: DECADRON Take decadron 4mg  (1 tab) twice a day PO for 3 days, then take 4mg  (1 tab) daily What changed:   how much to take  how to take this  when to take this  additional instructions   guaiFENesin-dextromethorphan 100-10 MG/5ML syrup Commonly known as: ROBITUSSIN DM Take 5 mLs by mouth every 4 (four) hours as needed for cough.   levETIRAcetam 500 MG tablet Commonly known as: KEPPRA Take 1 tablet (500 mg total) by mouth 2 (two) times daily.   morphine 10 MG/5ML solution Take 2.5 mLs (5 mg total) by mouth every 2 (two) hours as needed for severe pain.   multivitamin with minerals Tabs tablet Take 1 tablet by mouth daily.   PRESCRIPTION MEDICATION Apply 1 application topically in the morning, at noon, in the evening, and at bedtime. Fanny Cream (compounded)   terbinafine 250 MG tablet Commonly known as: LAMISIL Take 250 mg by mouth daily.   Vitamin D (Ergocalciferol) 1.25 MG (50000 UNIT) Caps capsule Commonly known as: DRISDOL Take 50,000 Units by mouth once a week.   ZINC OXIDE (TOPICAL) 10 % Crea Apply 1 application topically daily as needed.       No Known Allergies  Consultations:  None   Procedures/Studies: DG Knee 1-2 Views Right  Result Date: 08/10/2020 CLINICAL DATA:  Right knee pain after multiple falls. EXAM: RIGHT KNEE - 1-2 VIEW COMPARISON:  None. FINDINGS: Severely displaced and comminuted oblique fracture is seen involving the distal right femur. There is slight overriding of the fracture fragments. Degenerative changes are seen involving the right knee. IMPRESSION: Severely displaced and comminuted oblique fracture of distal right femur. Electronically Signed   By: Marijo Conception M.D.   On: 08/10/2020 13:24   DG  Ankle Complete Left  Result Date: 08/10/2020 CLINICAL DATA:  Pain EXAM: LEFT ANKLE COMPLETE - 3+ VIEW COMPARISON:  None. FINDINGS: Bilateral soft tissue swelling.  No fracture or dislocation. IMPRESSION: Bilateral soft tissue swelling without fracture dislocation. Electronically Signed   By: Dorise Bullion III M.D   On: 08/10/2020 19:48   DG Ankle Complete Right  Result Date: 08/11/2020 CLINICAL DATA:  Right hip pain. EXAM: RIGHT ANKLE - COMPLETE 3+ VIEW COMPARISON:  None. FINDINGS: Significant lateral soft tissue swelling identified. No displaced fractures are identified. There is a subtle lucency in the distal fibula medially which is favored to represent a prominent nutrient foramen. This lucency does not extend through the shaft of the distal fibula. The first lateral view was limited as the patient had a dressing noted posteriorly partially obscuring the posterior distal fibula. A repeat film was then obtained without the dressing. No definitive fractures are seen on today's study. Degenerative changes are seen in the mid and hindfoot. IMPRESSION: No convincing evidence of fracture. Degenerative changes in the mid and hindfoot. Electronically Signed   By: Dorise Bullion III M.D   On: 08/11/2020 09:50   CT HEAD WO CONTRAST  Result Date: 08/15/2020 CLINICAL DATA:  Intracranial mass EXAM: CT HEAD WITHOUT CONTRAST TECHNIQUE: Contiguous axial images were obtained from the base of the skull through the vertex  without intravenous contrast. COMPARISON:  08/10/2020 FINDINGS: Brain: The nodular mass involving the septum pellucidum and extending into the genu of the corpus callosum anteriorly as well as the body and isthmus of the corpus callosum posteriorly, asymmetrically more invasive on the left, is again seen. Since the prior examination, there has developed punctate foci of gas as well as acute hemorrhage within the dominant, left colossal and deep white matter portion of the mass in keeping with  stereotactic brain biopsy. A tiny burr hole is seen within the left parietal region and a tiny focus of hemorrhage is seen along the needle tract. The lobular hemorrhage within the mass extends into the left lateral ventricle with minimal layering hemorrhage and blood clot within the occipital horn. No significant mass effect noted. Minimal pneumocephalus within the left lateral ventricle related to biopsy. Ventricular size is normal.  Cerebellum unremarkable. Vascular: Unremarkable Skull: Otherwise intact Sinuses/Orbits: Paranasal sinuses are clear. Orbits are unremarkable. Other: Mastoid air cells and middle ear cavities are clear. IMPRESSION: Interval stereotactic biopsy of the left colossal/deep white matter portion of the infiltrative mass with mild lobular hemorrhage in the region of biopsy with slight intraventricular extension. No abnormal mass effect. Normal ventricular size. Electronically Signed   By: Fidela Salisbury MD   On: 08/15/2020 07:23   CT Head Wo Contrast  Result Date: 08/10/2020 CLINICAL DATA:  Mental status change, history of falls EXAM: CT HEAD WITHOUT CONTRAST TECHNIQUE: Contiguous axial images were obtained from the base of the skull through the vertex without intravenous contrast. COMPARISON:  CT 10/17/2013 FINDINGS: Brain: Ill-defined slightly hyperattenuating lobular masslike lesions about the corpus callosum,, interventricular septum and fornix with some hypoattenuating, likely vasogenic edema crossing genu of the corpus callosum and deep white matter adjacent the left lateral ventricle as well. Overall, incompletely characterized on the CT imaging. No areas of hyperdense hemorrhage. Some local mass effect and partial effacement of the lateral ventricles is noted. No extra-axial collection or hemorrhage is evident. Chronic mild expansion of the retro cerebellar CSF space likely reflecting arachnoid cyst versus mega cisterna magna. Symmetric prominence of the ventricles, cisterns and  sulci compatible with parenchymal volume loss. Chronic small vessel ischemic changes are similar to priors. Vascular: Atherosclerotic calcification of the carotid siphons and intradural vertebral arteries. No hyperdense vessel. Skull: No calvarial fracture or suspicious osseous lesion. No scalp swelling or hematoma. Sinuses/Orbits: Paranasal sinuses and mastoid air cells are predominantly clear. Included orbital structures are unremarkable. Other: None IMPRESSION: Lobular masslike lesion centered predominantly along the corpus callosum, interventricular septum and fornix with likely surrounding vasogenic edema which crosses the genu of the corpus callosum and is also present the left frontal white matter. Primary differential considerations would include a intracranial neoplasm such as CNS lymphoma or glioblastoma multiforme. Overall, incompletely characterized on this unenhanced head CT. Recommend further evaluation with MRI with contrast. No other acute intracranial abnormality. Background of some mild chronic parenchymal volume loss and microvascular angiopathy changes with intracranial atherosclerosis. These results were called by telephone at the time of interpretation on 08/10/2020 at 4:01 pm to provider [facility] , who verbally acknowledged these results. Electronically Signed   By: Lovena Le M.D.   On: 08/10/2020 16:01   CT Knee Right Wo Contrast  Result Date: 08/10/2020 CLINICAL DATA:  Frequent falls.  Distal femur fracture. EXAM: CT OF THE RIGHT KNEE WITHOUT CONTRAST TECHNIQUE: Multidetector CT imaging of the right knee was performed according to the standard protocol. Multiplanar CT image reconstructions were also generated. COMPARISON:  Radiographs same date. FINDINGS: Bones/Joint/Cartilage Oblique fracture of the distal femoral metadiaphysis is again noted with mild comminution. This fracture is associated with up to 4.3 cm of medial displacement as well as significant overriding of the  fracture fragments. There is no intra-articular extension of the fracture. The patella, proximal tibia and proximal fibula are intact. There are advanced tricompartmental degenerative changes at the knee with large osteophytes and intra-articular loose bodies. There is a small joint effusion. Ligaments Suboptimally assessed by CT. The anterior cruciate ligament is not well visualized. Muscles and Tendons The extensor mechanism is intact. Mild generalized muscular atrophy. Soft tissues Small hematoma adjacent to the fracture. No foreign body, soft tissue emphysema or bone destruction. IMPRESSION: 1. Mildly comminuted and significantly displaced fracture of the distal femoral metadiaphysis as described. No intra-articular extension of the fracture. 2. 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 Electronically Signed   By: Richardean Sale M.D.   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 Electronically Signed   By: Kellie Simmering DO   On: 08/13/2020 22:04   Result Date: 08/13/2020 CLINICAL DATA:  Provided history: Brain mass or lesion; brain lab protocol for surgery 921. EXAM: MRI HEAD WITH CONTRAST TECHNIQUE: Multiplanar, multiecho pulse sequences of the brain and surrounding structures were obtained with intravenous contrast. CONTRAST:  65mL GADAVIST GADOBUTROL 1 MMOL/ML IV SOLN COMPARISON:  Brain MRI with and without contrast 08/10/2020, head CT 08/10/2020. FINDINGS: Brain: Unchanged appearance of lobulated and infiltrative masslike T2/FLAIR hyperintense signal abnormality expanding the body of the corpus callosum with nodular extension along the  septum pellucidum and contiguous spread along the margins of the frontal horns of both lateral ventricles. As before, the lesion crosses midline via the body of the corpus callosum with centrum semiovale involvement (greater on the left) and frontal horn involvement (greater on the right). Also unchanged, there is corresponding heterogeneous contrast enhancement throughout the affected areas. Redemonstrated small areas of discontinuous petechial enhancement, most notably within the callosal splenium. Unchanged suspicious abnormal leptomeningeal enhancement with both internal auditory canals and questionable abnormal leptomeningeal enhancement along the ventral brainstem. Background mild scattered T2/FLAIR hyperintensity within the cerebral white matter and brainstem is nonspecific, but most commonly seen on the basis of chronic small vessel ischemia. Vascular: Expected enhancement within the proximal large arterial vessels and dural venous sinuses. Skull and upper cervical spine: No suspicious focal marrow lesion is identified on the acquired sequences. Sinuses/Orbits: No appreciable acute orbital abnormality on the acquired sequences. Mild paranasal sinus mucosal thickening, most notably ethmoidal. Trace fluid within left mastoid air cells. IMPRESSION: Central callosal, septal and periventricular infiltrative an enhancing tumor most suspicious for glioblastoma/high-grade glioma, unchanged as compared to 08/10/2020. Lymphoma can have a similar imaging appearance, but is considered less likely. 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 Electronically Signed: By: Kellie Simmering DO On: 08/13/2020 21:56   MR BRAIN W WO CONTRAST  Result Date: 08/10/2020 CLINICAL DATA:  74 year old female with mental status changes, falls. Abnormal noncontrast head today suggesting a pericallosal mass. EXAM: MRI HEAD WITHOUT AND  WITH CONTRAST TECHNIQUE: Multiplanar, multiecho pulse sequences of the brain and  surrounding structures were obtained without and with intravenous contrast. CONTRAST:  88mL GADAVIST GADOBUTROL 1 MMOL/ML IV SOLN COMPARISON:  Head CT earlier today. Prior head CTs 10/17/2013 and earlier. FINDINGS: Brain: Lobulated, infiltrative masslike T2 and FLAIR hyperintense lesion expanding the body of the corpus callosum, with nodular extension along the septum pellucidum, and contiguous spread in and around the frontal horns of both lateral ventricles. The lesion crosses midline via the body of the corpus callosum, with left predominant centrum semiovale but right predominant frontal horn regional involvement. Much of the lesion shows mildly to moderately restricted diffusion but relative T2 hyperintensity. There is petechial hemorrhage in the central colo cell and pericallosal white matter portion on SWI (series 12, image 35). Following contrast there is heterogeneous enhancement throughout the affected areas. And there are occasional small discontinuous areas of petechial enhancement (most notably in the splenium of the corpus callosum on series 20, image 13. All told, the lesion encompasses roughly 69 x 51 x 36 mm (AP by transverse by CC). Despite the fairly extensive periventricular configuration, no disseminated ependymal enhancement is identified. However, there is suspicious abnormal leptomeningeal enhancement in both internal auditory canals right greater than left (series 18, image 14). And there is also questionable abnormally increased leptomeningeal enhancement along the ventral brainstem (series 20, image 13). No other abnormal meningeal enhancement. No ventriculomegaly or transependymal edema. No significant intracranial mass effect. Basilar cisterns remain patent. No superimposed restricted diffusion suggestive of acute infarction. No extra-axial collection or acute intracranial hemorrhage. Cervicomedullary  junction and pituitary are within normal limits. Vascular: Major intracranial vascular flow voids are preserved, with generalized intracranial artery tortuosity. The major dural venous sinuses are enhancing and appear to be patent. Skull and upper cervical spine: Negative visible cervical spine and spinal cord. Normal bone marrow signal. Sinuses/Orbits: Negative. Other: Scalp and face appear negative. IMPRESSION: 1. 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 CNS lymphoma felt less likely. Area of involvement encompasses 69 x 51 x 36 mm. 2. 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 3. No significant intracranial mass effect. 4. Recommend Neuro-Oncology consultation. Electronically Signed   By: Genevie Ann M.D.   On: 08/10/2020 18:03   MR LUMBAR SPINE WO CONTRAST  Result Date: 08/07/2020 CLINICAL DATA:  Low back pain and bilateral leg pain over the last few days. EXAM: MRI LUMBAR SPINE WITHOUT CONTRAST TECHNIQUE: Multiplanar, multisequence MR imaging of the lumbar spine was performed. No intravenous contrast was administered. COMPARISON:  CT 07/28/2020 and 07/13/2020. FINDINGS: Segmentation:  5 lumbar type vertebral bodies. Alignment: Mild thoracolumbar curvature convex to the left and lower lumbar curvature convex to the right. No antero or retrolisthesis. Vertebrae: Old healed minor superior endplate compression deformity at L1 with maximal loss of height 40%. No retropulsed bone. No evidence of recent fracture in the region from inferior T11 to superior S3. Conus medullaris and cauda equina: Conus extends to the L2 level. Conus and cauda equina appear normal. Paraspinal and other soft tissues: No significant finding. Disc levels: Minimal non-compressive disc bulges at T11-12,  T12-L1 and L1-2. L2-3: Moderate disc bulge. Slight indentation of the thecal sac but no apparent compressive stenosis. L3-4: Endplate osteophytes and moderate disc bulge more prominent towards the left. Mild narrowing of the lateral recesses but no visible compressive stenosis. L4-5: Endplate osteophytes and bulging of the disc. Mild narrowing of  the lateral recesses but no compressive stenosis. L5-S1: Endplate osteophytes and bulging of the disc. No canal stenosis. Bilateral foraminal narrowing right more than left that would have some potential to affect either or both L5 nerves, more likely the right. IMPRESSION: 1. Old healed minor superior endplate compression deformity at L1 with maximal loss of height of 40%. No retropulsed bone. No evidence of recent fracture. No evidence of sacral insufficiency fracture as question on the previous CT. 2. 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 No compressive central canal stenosis. Bilateral foraminal narrowing at L5-S1 right worse than left that would have some potential to affect either or both L5 nerves, more likely the right. Electronically Signed   By: Nelson Chimes M.D.   On: 08/07/2020 09:24   DG Chest Port 1 View  Result Date: 08/30/2020 CLINICAL DATA:  Shortness of breath EXAM: PORTABLE CHEST 1 VIEW COMPARISON:  08/10/2020 FINDINGS: The heart size is stable but mildly enlarged. There is a new dense retrocardiac opacity. Lungs appear somewhat hyperexpanded. Chronic interstitial airspace opacities are again noted. Aortic calcifications are noted. There is no definite acute osseous abnormality. IMPRESSION: New dense retrocardiac opacity may represent atelectasis or infiltrate. Electronically Signed   By: Constance Holster M.D.   On: 08/30/2020 19:30   DG Chest Portable 1 View  Result Date: 08/10/2020 CLINICAL DATA:  Altered mental status EXAM: PORTABLE CHEST 1 VIEW COMPARISON:  Radiograph 07/28/2020 FINDINGS: Chronically  coarsened interstitial changes in the lungs are not significantly changed from comparison studies. These are slightly more pronounced in the left lung than right. No new focal consolidative opacity is seen. No visible pneumothorax or effusion though portion of the left costophrenic sulcus is collimated from view. Cardiac size is within normal limits for portable technique. The aorta is calcified. The remaining cardiomediastinal contours are unremarkable. No acute osseous or soft tissue abnormality. Degenerative changes are present in the imaged spine and shoulders. Telemetry leads and nasal cannula overlie the chest. IMPRESSION: Chronically coarsened interstitial changes in the lungs. No convincing new focal consolidative opacity. Electronically Signed   By: Lovena Le M.D.   On: 08/10/2020 15:08   DG C-Arm 1-60 Min  Result Date: 08/11/2020 CLINICAL DATA:  Intramedullary nail placement. EXAM: RIGHT FEMUR 2 VIEWS; DG C-ARM 1-60 MIN COMPARISON:  08/10/2020 FINDINGS: Placement of right intramedullary femoral nail with proximal and distal anchoring screws. Hardware is intact as there is anatomic alignment over patient's distal femoral diametaphyseal fracture. Remainder the exam is unchanged. IMPRESSION: Internal fixation of distal femoral diametaphyseal fracture in anatomic alignment. Hardware intact and in adequate position. Electronically Signed   By: Marin Olp M.D.   On: 08/11/2020 13:45   DG HIP UNILAT WITH PELVIS 2-3 VIEWS RIGHT  Result Date: 08/10/2020 CLINICAL DATA:  Right hip pain. EXAM: DG HIP (WITH OR WITHOUT PELVIS) 2-3V RIGHT COMPARISON:  None. FINDINGS: There is no evidence of hip fracture or dislocation. There is no evidence of arthropathy or other focal bone abnormality. IMPRESSION: Negative. Electronically Signed   By: Dorise Bullion III M.D   On: 08/10/2020 19:42   DG HIP UNILAT WITH PELVIS 2-3 VIEWS RIGHT  Result Date: 08/07/2020 CLINICAL DATA:  Status post fall with subsequent right  hip pain. EXAM: DG HIP (WITH OR WITHOUT PELVIS) 2-3V RIGHT COMPARISON:  July 28, 2020 FINDINGS: There is no evidence of an acute hip fracture or dislocation. The mild to moderate severity degenerative changes seen involving both hips. Radiopaque surgical clips are seen overlying the  lower pelvis. IMPRESSION: No acute osseous abnormality. Electronically Signed   By: Virgina Norfolk M.D.   On: 08/07/2020 00:58   DG FEMUR 1V RIGHT  Result Date: 08/11/2020 CLINICAL DATA:  Fixation of right femoral fracture. EXAM: RIGHT FEMUR 1 VIEW COMPARISON:  08/10/2020 FINDINGS: Examination demonstrates evidence of patient's right femoral intramedullary nail ridging known distal femoral diametaphyseal fracture. There are proximal and distal anchoring screws. Hardware is intact with anatomic alignment over the fracture site. Exam is otherwise unchanged. IMPRESSION: Fixation of right femoral diametaphyseal fracture with hardware intact and anatomic alignment over the fracture site. Electronically Signed   By: Marin Olp M.D.   On: 08/11/2020 13:48   DG FEMUR, MIN 2 VIEWS RIGHT  Result Date: 08/11/2020 CLINICAL DATA:  Intramedullary nail placement. EXAM: RIGHT FEMUR 2 VIEWS; DG C-ARM 1-60 MIN COMPARISON:  08/10/2020 FINDINGS: Placement of right intramedullary femoral nail with proximal and distal anchoring screws. Hardware is intact as there is anatomic alignment over patient's distal femoral diametaphyseal fracture. Remainder the exam is unchanged. IMPRESSION: Internal fixation of distal femoral diametaphyseal fracture in anatomic alignment. Hardware intact and in adequate position. Electronically Signed   By: Marin Olp M.D.   On: 08/11/2020 13:45     Discharge Exam: Vitals:   08/31/20 2004 09/01/20 0507  BP: 131/76 (!) 132/59  Pulse: 87 63  Resp: 19 18  Temp: 97.8 F (36.6 C) 97.6 F (36.4 C)  SpO2: 93% 100%   Vitals:   08/31/20 1147 08/31/20 1343 08/31/20 2004 09/01/20 0507  BP:  (!) 147/81  131/76 (!) 132/59  Pulse:  70 87 63  Resp:  18 19 18   Temp:  98 F (36.7 C) 97.8 F (36.6 C) 97.6 F (36.4 C)  TempSrc:  Oral Oral Oral  SpO2: 100% 100% 93% 100%  Weight:      Height:        General: Pt is alert, awake, not in acute distress Cardiovascular: RRR, S1/S2 +, no rubs, no gallops Respiratory: CTA bilaterally, no wheezing, no rhonchi Abdominal: Soft, NT, ND, bowel sounds + Extremities: no edema, no cyanosis    The results of significant diagnostics from this hospitalization (including imaging, microbiology, ancillary and laboratory) are listed below for reference.     Microbiology: Recent Results (from the past 240 hour(s))  Respiratory Panel by RT PCR (Flu A&B, Covid) - Nasopharyngeal Swab     Status: None   Collection Time: 08/30/20  7:03 PM   Specimen: Nasopharyngeal Swab  Result Value Ref Range Status   SARS Coronavirus 2 by RT PCR NEGATIVE NEGATIVE Final    Comment: (NOTE) SARS-CoV-2 target nucleic acids are NOT DETECTED.  The SARS-CoV-2 RNA is generally detectable in upper respiratoy specimens during the acute phase of infection. The lowest concentration of SARS-CoV-2 viral copies this assay can detect is 131 copies/mL. A negative result does not preclude SARS-Cov-2 infection and should not be used as the sole basis for treatment or other patient management decisions. A negative result may occur with  improper specimen collection/handling, submission of specimen other than nasopharyngeal swab, presence of viral mutation(s) within the areas targeted by this assay, and inadequate number of viral copies (<131 copies/mL). A negative result must be combined with clinical observations, patient history, and epidemiological information. The expected result is Negative.  Fact Sheet for Patients:  PinkCheek.be  Fact Sheet for Healthcare Providers:  GravelBags.it  This test is no t yet approved or  cleared by the [place] and  has been authorized  for detection and/or diagnosis of SARS-CoV-2 by FDA under an Emergency Use Authorization (EUA). This EUA will remain  in effect (meaning this test can be used) for the duration of the COVID-19 declaration under Section 564(b)(1) of the Act, 21 U.S.C. section 360bbb-3(b)(1), unless the authorization is terminated or revoked sooner.     Influenza A by PCR NEGATIVE NEGATIVE Final   Influenza B by PCR NEGATIVE NEGATIVE Final    Comment: (NOTE) The Xpert Xpress SARS-CoV-2/FLU/RSV assay is intended as an aid in  the diagnosis of influenza from Nasopharyngeal swab specimens and  should not be used as a sole basis for treatment. Nasal washings and  aspirates are unacceptable for Xpert Xpress SARS-CoV-2/FLU/RSV  testing.  Fact Sheet for Patients: PinkCheek.be  Fact Sheet for Healthcare Providers: GravelBags.it  This test is not yet approved or cleared by the [place] FDA and  has been authorized for detection and/or diagnosis of SARS-CoV-2 by  FDA under an Emergency Use Authorization (EUA). This EUA will remain  in effect (meaning this test can be used) for the duration of the  Covid-19 declaration under Section 564(b)(1) of the Act, 21  U.S.C. section 360bbb-3(b)(1), unless the authorization is  terminated or revoked. Performed at [facility], [address], [place], [place] [address]   MRSA PCR Screening     Status: None   Collection Time: 08/30/20  8:55 PM   Specimen: Nasal Mucosa; Nasopharyngeal  Result Value Ref Range Status   MRSA by PCR NEGATIVE NEGATIVE Final    Comment:        The GeneXpert MRSA Assay (FDA approved for NASAL specimens only), is one component of a comprehensive MRSA colonization surveillance program. It is not intended to diagnose MRSA infection nor to guide or monitor treatment for MRSA infections. Performed at [facility],  [address] [place], [place] [address]      Labs: BNP (last 3 results) No results for input(s): BNP in the last 8760 hours. Basic Metabolic Panel: Recent Labs  Lab 08/30/20 2008 08/31/20 0200 09/01/20 0738  NA 148* 147* 147*  K 4.1 3.6 3.4*  CL 110 112* 113*  CO2 23 21* 26  GLUCOSE 275* 338* 149*  BUN 58* 61* 40*  CREATININE 0.73 0.83 0.37*  CALCIUM 9.9 9.2 9.4  MG  --   --  2.4   Liver Function Tests: No results for input(s): AST, ALT, ALKPHOS, BILITOT, PROT, ALBUMIN in the last 168 hours. No results for input(s): LIPASE, AMYLASE in the last 168 hours. No results for input(s): AMMONIA in the last 168 hours. CBC: Recent Labs  Lab 08/30/20 1909 08/31/20 0200 09/01/20 0738  WBC 11.0* 9.8 5.7  NEUTROABS 9.1*  --   --   HGB 15.4* 13.0 12.0  HCT 49.1* 41.9 38.8  MCV 97.4 97.0 96.5  PLT 171 132* 104*   Cardiac Enzymes: No results for input(s): CKTOTAL, CKMB, CKMBINDEX, TROPONINI in the last 168 hours. BNP: Invalid input(s): POCBNP CBG: Recent Labs  Lab 08/31/20 2007 09/01/20 0108 09/01/20 0441 09/01/20 0742 09/01/20 1117  GLUCAP 151* 132* 129* 136* 137*   D-Dimer No results for input(s): DDIMER in the last 72 hours. Hgb A1c No results for input(s): HGBA1C in the last 72 hours. Lipid Profile No results for input(s): CHOL, HDL, LDLCALC, TRIG, CHOLHDL, LDLDIRECT in the last 72 hours. Thyroid function studies No results for input(s): TSH, T4TOTAL, T3FREE, THYROIDAB in the last 72 hours.  Invalid input(s): FREET3 Anemia work up No results for input(s): VITAMINB12,  FOLATE, FERRITIN, TIBC, IRON, RETICCTPCT in the last 72 hours. Urinalysis    Component Value Date/Time   COLORURINE AMBER (A) 08/10/2020 1730   APPEARANCEUR CLOUDY (A) 08/10/2020 1730   LABSPEC 1.028 08/10/2020 1730   PHURINE 5.0 08/10/2020 1730   GLUCOSEU >=500 (A) 08/10/2020 1730   HGBUR LARGE (A) 08/10/2020 1730   BILIRUBINUR NEGATIVE 08/10/2020 1730   KETONESUR 20 (A) 08/10/2020 1730    PROTEINUR NEGATIVE 08/10/2020 1730   UROBILINOGEN 0.2 08/23/2013 1404   NITRITE NEGATIVE 08/10/2020 1730   LEUKOCYTESUR LARGE (A) 08/10/2020 1730   Sepsis Labs Invalid input(s): PROCALCITONIN,  WBC,  LACTICIDVEN Microbiology Recent Results (from the past 240 hour(s))  Respiratory Panel by RT PCR (Flu A&B, Covid) - Nasopharyngeal Swab     Status: None   Collection Time: 08/30/20  7:03 PM   Specimen: Nasopharyngeal Swab  Result Value Ref Range Status   SARS Coronavirus 2 by RT PCR NEGATIVE NEGATIVE Final    Comment: (NOTE) SARS-CoV-2 target nucleic acids are NOT DETECTED.  The SARS-CoV-2 RNA is generally detectable in upper respiratoy specimens during the acute phase of infection. The lowest concentration of SARS-CoV-2 viral copies this assay can detect is 131 copies/mL. A negative result does not preclude SARS-Cov-2 infection and should not be used as the sole basis for treatment or other patient management decisions. A negative result may occur with  improper specimen collection/handling, submission of specimen other than nasopharyngeal swab, presence of viral mutation(s) within the areas targeted by this assay, and inadequate number of viral copies (<131 copies/mL). A negative result must be combined with clinical observations, patient history, and epidemiological information. The expected result is Negative.  Fact Sheet for Patients:  PinkCheek.be  Fact Sheet for Healthcare Providers:  GravelBags.it  This test is no t yet approved or cleared by the [place] FDA and  has been authorized for detection and/or diagnosis of SARS-CoV-2 by FDA under an Emergency Use Authorization (EUA). This EUA will remain  in effect (meaning this test can be used) for the duration of the COVID-19 declaration under Section 564(b)(1) of the Act, 21 U.S.C. section 360bbb-3(b)(1), unless the authorization is terminated or revoked  sooner.     Influenza A by PCR NEGATIVE NEGATIVE Final   Influenza B by PCR NEGATIVE NEGATIVE Final    Comment: (NOTE) The Xpert Xpress SARS-CoV-2/FLU/RSV assay is intended as an aid in  the diagnosis of influenza from Nasopharyngeal swab specimens and  should not be used as a sole basis for treatment. Nasal washings and  aspirates are unacceptable for Xpert Xpress SARS-CoV-2/FLU/RSV  testing.  Fact Sheet for Patients: PinkCheek.be  Fact Sheet for Healthcare Providers: GravelBags.it  This test is not yet approved or cleared by the [place] FDA and  has been authorized for detection and/or diagnosis of SARS-CoV-2 by  FDA under an Emergency Use Authorization (EUA). This EUA will remain  in effect (meaning this test can be used) for the duration of the  Covid-19 declaration under Section 564(b)(1) of the Act, 21  U.S.C. section 360bbb-3(b)(1), unless the authorization is  terminated or revoked. Performed at [facility], [address], [place], [place] [address]   MRSA PCR Screening     Status: None   Collection Time: 08/30/20  8:55 PM   Specimen: Nasal Mucosa; Nasopharyngeal  Result Value Ref Range Status   MRSA by PCR NEGATIVE NEGATIVE Final    Comment:        The GeneXpert MRSA Assay (FDA approved for NASAL specimens  only), is one component of a comprehensive MRSA colonization surveillance program. It is not intended to diagnose MRSA infection nor to guide or monitor treatment for MRSA infections. Performed at [facility], [address] [place], [place] [address]      Time coordinating discharge: 35 minutes  SIGNED:   Rodena Goldmann, DO [facility] 09/01/2020, 12:03 PM  If 7PM-7AM, please contact night-coverage [email or web address]

## 2020-09-04 ENCOUNTER — Encounter (HOSPITAL_COMMUNITY): Payer: Self-pay | Admitting: Internal Medicine

## 2020-09-04 ENCOUNTER — Inpatient Hospital Stay: Payer: Medicare Other | Attending: Neurological Surgery

## 2020-09-06 ENCOUNTER — Inpatient Hospital Stay: Payer: Medicare Other | Admitting: Internal Medicine

## 2020-09-24 DEATH — deceased

## 2021-07-30 IMAGING — DX DG ANKLE COMPLETE 3+V*R*
1 series · 1 of 1 positions shown · non-contrast
Comparison: None.

CLINICAL DATA: Right hip pain.

EXAM:
RIGHT ANKLE - COMPLETE 3+ VIEW

[ankle]
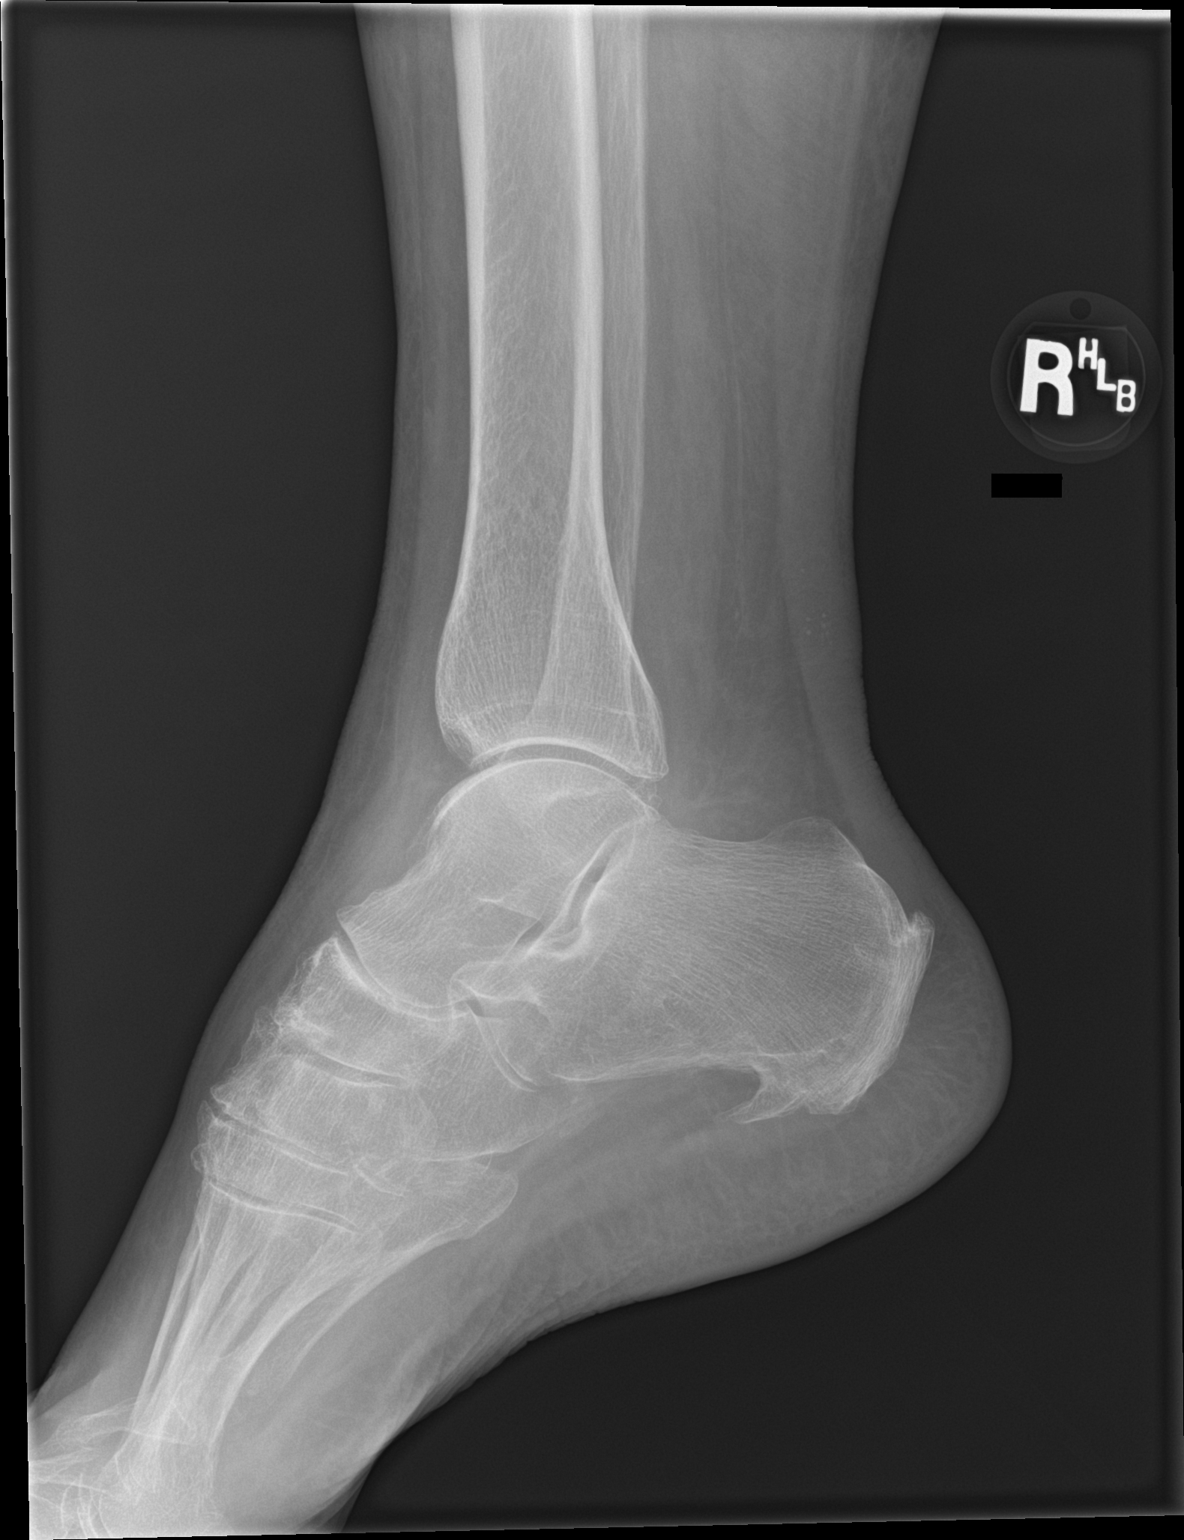

[1 of 1 positions shown; findings below may reference images not displayed]

FINDINGS: Significant lateral soft tissue swelling identified. No displaced
fractures are identified. There is a subtle lucency in the distal
fibula medially which is favored to represent a prominent nutrient
foramen. This lucency does not extend through the shaft of the
distal fibula. The first lateral view was limited as the patient had
a dressing noted posteriorly partially obscuring the posterior
distal fibula. A repeat film was then obtained without the dressing.
No definitive fractures are seen on today's study. Degenerative
changes are seen in the mid and hindfoot.
IMPRESSION: No convincing evidence of fracture. Degenerative changes in the mid
and hindfoot.

## 2021-08-01 IMAGING — MR MR HEAD W/ CM
3 of 4 series · 26 of 48 positions shown · IV contrast (YES GAD)
Comparison: Brain MRI with and without contrast 08/10/2020, head CT
08/10/2020.
COMPARISON: Brain MRI with and without contrast 08/10/2020, head CT
08/10/2020.

Addendum:
CLINICAL DATA: Provided history: Brain mass or lesion; [REDACTED]
protocol for surgery 921.

EXAM:
MRI HEAD WITH CONTRAST
TECHNIQUE: Multiplanar, multiecho pulse sequences of the brain and surrounding
structures were obtained with intravenous contrast.
CONTRAST:  10mL GADAVIST GADOBUTROL 1 MMOL/ML IV SOLN

[Series 6: ax 3(person_name) · axial · 1.0mm · 1.02mm/px · z∈[-293,-68]mm · 12 of 226 slices shown (1 of 2)]
[im 1/226]
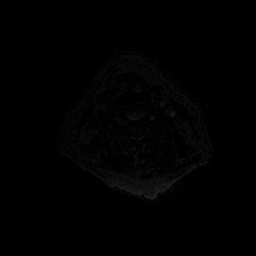
[im 21/226]
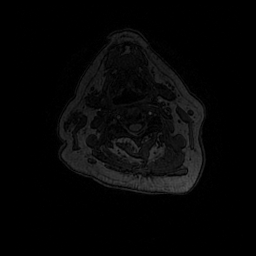
[im 41/226]
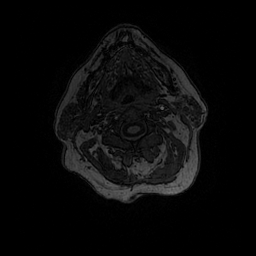
[im 62/226]
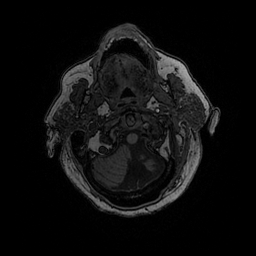
[im 82/226]
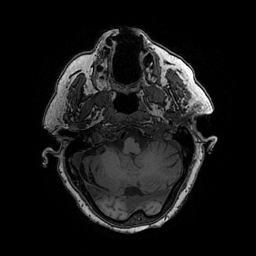
[im 103/226]
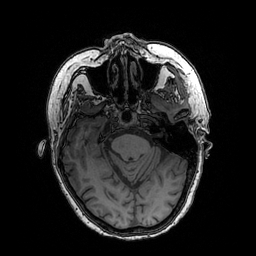
[im 123/226]
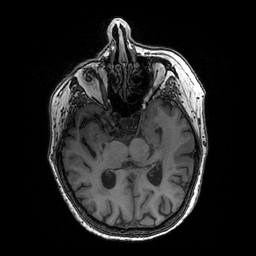
[im 144/226]
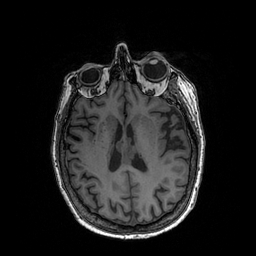
[im 164/226]
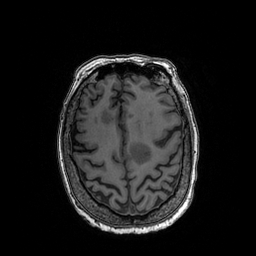
[im 185/226]
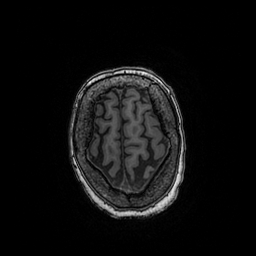
[im 205/226]
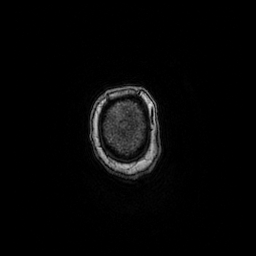
[im 226/226]
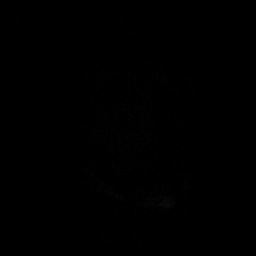

[Series 7: ax 3(person_name) · axial · 1.0mm · 1.02mm/px · z∈[-293,-68]mm · 12 of 226 slices shown (2 of 2)]
[im 1/226]
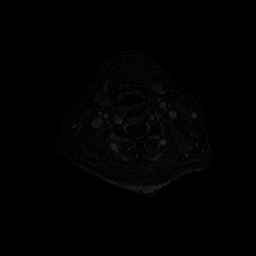
[im 21/226]
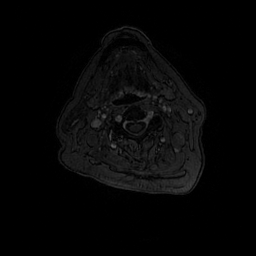
[im 41/226]
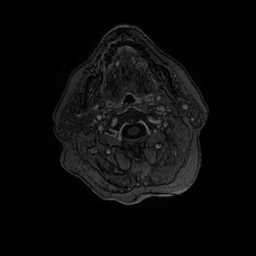
[im 62/226]
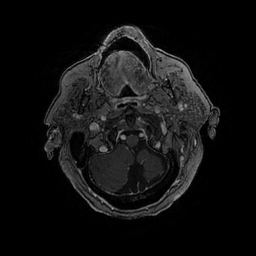
[im 82/226]
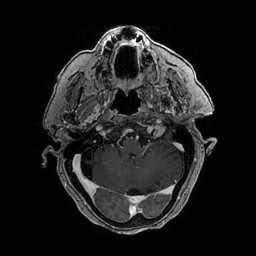
[im 103/226]
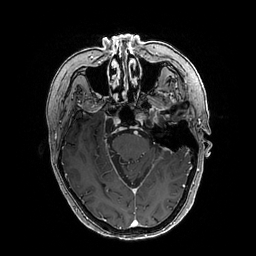
[im 123/226]
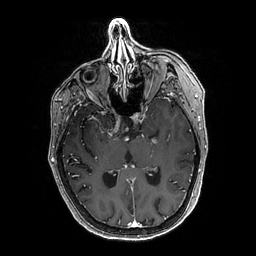
[im 144/226]
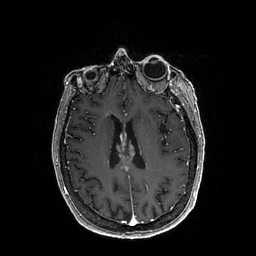
[im 164/226]
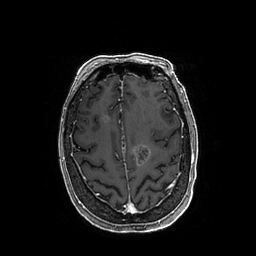
[im 185/226]
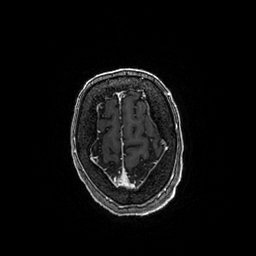
[im 205/226]
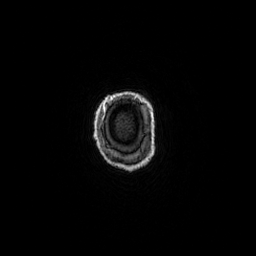
[im 226/226]
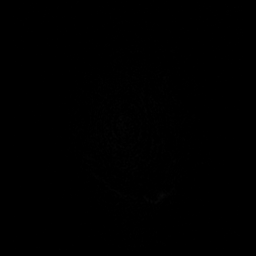

[Series 8: FLAIR · sagittal · 5.0mm · 0.49mm/px · 2 of 33 slices shown]
[im 1/33]
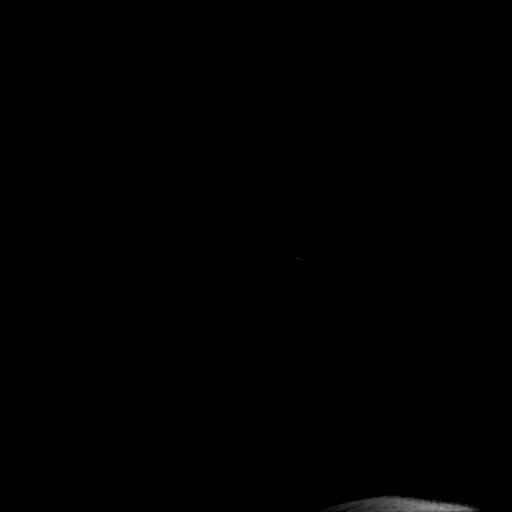
[im 33/33]
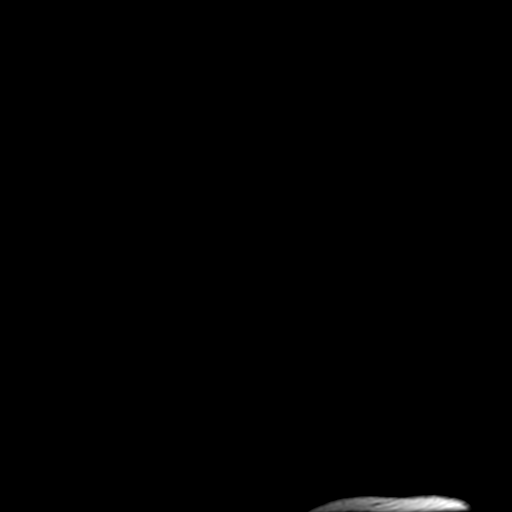

[26 of 48 positions shown; findings below may reference images not displayed]

FINDINGS: Brain:

Unchanged appearance of lobulated and infiltrative masslike T2/FLAIR
hyperintense signal abnormality expanding the body of the corpus
callosum with nodular extension along the septum pellucidum and
contiguous spread along the margins of the frontal horns of both
lateral ventricles. As before, the lesion crosses midline via the
body of the corpus callosum with centrum semiovale involvement
(greater on the left) and frontal horn involvement (greater on the
right). Also unchanged, there is corresponding heterogeneous
contrast enhancement throughout the affected areas. Redemonstrated
small areas of discontinuous petechial enhancement, most notably
within the callosal splenium.

Unchanged suspicious abnormal leptomeningeal enhancement with both
internal auditory canals and questionable abnormal leptomeningeal
enhancement along the ventral brainstem.

Background mild scattered T2/FLAIR hyperintensity within the
cerebral white matter and brainstem is nonspecific, but most
commonly seen on the basis of chronic small vessel ischemia.

Vascular: Expected enhancement within the proximal large arterial
vessels and dural venous sinuses.

Skull and upper cervical spine: No suspicious focal marrow lesion is
identified on the acquired sequences.

Sinuses/Orbits: No appreciable acute orbital abnormality on the
acquired sequences. Mild paranasal sinus mucosal thickening, most
notably ethmoidal. Trace fluid within left mastoid air cells.
IMPRESSION: Central callosal, septal and periventricular infiltrative an
enhancing tumor most suspicious for glioblastoma/high-grade glioma,
unchanged as compared to 08/10/2020. Lymphoma can have a similar
imaging appearance, but is considered less likely.

Unchanged evidence of leptomeningeal disease within the internal
auditory canals with questionable additional involvement of the
ventral brainstem.

ADDENDUM:
More conspicuous than on the prior MRI, leptomeningeal disease is
also suspected along the cisternal segment of the left trigeminal
nerve (series 7, image 107) and along the left hypoglossal nerve
(series 100, image 328).

*** End of Addendum ***
FINDINGS: Brain:

Unchanged appearance of lobulated and infiltrative masslike T2/FLAIR
hyperintense signal abnormality expanding the body of the corpus
callosum with nodular extension along the septum pellucidum and
contiguous spread along the margins of the frontal horns of both
lateral ventricles. As before, the lesion crosses midline via the
body of the corpus callosum with centrum semiovale involvement
(greater on the left) and frontal horn involvement (greater on the
right). Also unchanged, there is corresponding heterogeneous
contrast enhancement throughout the affected areas. Redemonstrated
small areas of discontinuous petechial enhancement, most notably
within the callosal splenium.

Unchanged suspicious abnormal leptomeningeal enhancement with both
internal auditory canals and questionable abnormal leptomeningeal
enhancement along the ventral brainstem.

Background mild scattered T2/FLAIR hyperintensity within the
cerebral white matter and brainstem is nonspecific, but most
commonly seen on the basis of chronic small vessel ischemia.

Vascular: Expected enhancement within the proximal large arterial
vessels and dural venous sinuses.

Skull and upper cervical spine: No suspicious focal marrow lesion is
identified on the acquired sequences.

Sinuses/Orbits: No appreciable acute orbital abnormality on the
acquired sequences. Mild paranasal sinus mucosal thickening, most
notably ethmoidal. Trace fluid within left mastoid air cells.
IMPRESSION: Central callosal, septal and periventricular infiltrative an
enhancing tumor most suspicious for glioblastoma/high-grade glioma,
unchanged as compared to 08/10/2020. Lymphoma can have a similar
imaging appearance, but is considered less likely.

Unchanged evidence of leptomeningeal disease within the internal
auditory canals with questionable additional involvement of the
ventral brainstem.
